# Patient Record
Sex: Female | Born: 1970 | Race: Black or African American | Hispanic: No | Marital: Single | Smoking: Never smoker
Health system: Southern US, Community
[De-identification: ages and names within clinical notes are randomized; demographics above are authoritative.]

## PROBLEM LIST (undated history)

## (undated) DIAGNOSIS — H409 Unspecified glaucoma: Secondary | ICD-10-CM

## (undated) DIAGNOSIS — J45909 Unspecified asthma, uncomplicated: Secondary | ICD-10-CM

## (undated) DIAGNOSIS — M25473 Effusion, unspecified ankle: Secondary | ICD-10-CM

## (undated) DIAGNOSIS — I10 Essential (primary) hypertension: Secondary | ICD-10-CM

## (undated) DIAGNOSIS — D259 Leiomyoma of uterus, unspecified: Secondary | ICD-10-CM

## (undated) HISTORY — DX: Leiomyoma of uterus, unspecified: D25.9

## (undated) HISTORY — DX: Unspecified asthma, uncomplicated: J45.909

## (undated) HISTORY — PX: WISDOM TOOTH EXTRACTION: SHX21

## (undated) HISTORY — DX: Unspecified glaucoma: H40.9

## (undated) HISTORY — DX: Effusion, unspecified ankle: M25.473

## (undated) HISTORY — DX: Essential (primary) hypertension: I10

---

## 1998-11-05 ENCOUNTER — Other Ambulatory Visit: Admission: RE | Admit: 1998-11-05 | Discharge: 1998-11-05 | Payer: Self-pay | Admitting: Obstetrics and Gynecology

## 2000-01-21 ENCOUNTER — Other Ambulatory Visit: Admission: RE | Admit: 2000-01-21 | Discharge: 2000-01-21 | Payer: Self-pay | Admitting: Obstetrics and Gynecology

## 2000-10-26 ENCOUNTER — Other Ambulatory Visit: Admission: RE | Admit: 2000-10-26 | Discharge: 2000-10-26 | Payer: Self-pay | Admitting: Obstetrics and Gynecology

## 2001-05-13 ENCOUNTER — Inpatient Hospital Stay (HOSPITAL_COMMUNITY): Admission: AD | Admit: 2001-05-13 | Discharge: 2001-05-13 | Payer: Self-pay | Admitting: Obstetrics and Gynecology

## 2001-05-15 ENCOUNTER — Inpatient Hospital Stay (HOSPITAL_COMMUNITY): Admission: AD | Admit: 2001-05-15 | Discharge: 2001-05-17 | Payer: Self-pay | Admitting: Obstetrics and Gynecology

## 2001-05-19 ENCOUNTER — Encounter: Admission: RE | Admit: 2001-05-19 | Discharge: 2001-06-18 | Payer: Self-pay | Admitting: Obstetrics and Gynecology

## 2001-10-25 ENCOUNTER — Other Ambulatory Visit: Admission: RE | Admit: 2001-10-25 | Discharge: 2001-10-25 | Payer: Self-pay | Admitting: Obstetrics and Gynecology

## 2002-11-02 ENCOUNTER — Other Ambulatory Visit: Admission: RE | Admit: 2002-11-02 | Discharge: 2002-11-02 | Payer: Self-pay | Admitting: Obstetrics and Gynecology

## 2003-10-31 ENCOUNTER — Other Ambulatory Visit: Admission: RE | Admit: 2003-10-31 | Discharge: 2003-10-31 | Payer: Self-pay | Admitting: Obstetrics and Gynecology

## 2004-11-07 ENCOUNTER — Other Ambulatory Visit: Admission: RE | Admit: 2004-11-07 | Discharge: 2004-11-07 | Payer: Self-pay | Admitting: Obstetrics and Gynecology

## 2005-12-27 ENCOUNTER — Encounter: Payer: Self-pay | Admitting: Emergency Medicine

## 2005-12-27 ENCOUNTER — Ambulatory Visit (HOSPITAL_COMMUNITY): Admission: AD | Admit: 2005-12-27 | Discharge: 2005-12-28 | Payer: Self-pay | Admitting: Obstetrics and Gynecology

## 2006-04-02 ENCOUNTER — Other Ambulatory Visit: Admission: RE | Admit: 2006-04-02 | Discharge: 2006-04-02 | Payer: Self-pay | Admitting: Obstetrics and Gynecology

## 2006-04-08 ENCOUNTER — Ambulatory Visit (HOSPITAL_COMMUNITY): Admission: RE | Admit: 2006-04-08 | Discharge: 2006-04-08 | Payer: Self-pay | Admitting: Obstetrics and Gynecology

## 2010-12-15 ENCOUNTER — Encounter: Payer: Self-pay | Admitting: Obstetrics and Gynecology

## 2012-01-22 ENCOUNTER — Ambulatory Visit (INDEPENDENT_AMBULATORY_CARE_PROVIDER_SITE_OTHER): Payer: Commercial Indemnity | Admitting: Obstetrics and Gynecology

## 2012-01-22 DIAGNOSIS — Z01419 Encounter for gynecological examination (general) (routine) without abnormal findings: Secondary | ICD-10-CM

## 2013-01-20 ENCOUNTER — Encounter: Payer: Self-pay | Admitting: Obstetrics and Gynecology

## 2013-01-20 ENCOUNTER — Ambulatory Visit: Payer: BC Managed Care – PPO | Admitting: Obstetrics and Gynecology

## 2013-01-20 VITALS — BP 108/70 | Ht 70.0 in | Wt 222.0 lb

## 2013-01-20 DIAGNOSIS — Z124 Encounter for screening for malignant neoplasm of cervix: Secondary | ICD-10-CM

## 2013-01-20 NOTE — Progress Notes (Signed)
ANNUAL GYNECOLOGIC EXAMINATION   Sarah Warren is a 42 y.o. female, G3P2001, who presents for an annual exam. The patient is doing well on Junel fe 1/20 OCP's.    History   Social History  . Marital Status: Married    Spouse Name: N/A    Number of Children: N/A  . Years of Education: N/A   Social History Main Topics  . Smoking status: Never Smoker   . Smokeless tobacco: Never Used  . Alcohol Use: Yes     Comment: occasional wine  . Drug Use: No  . Sexually Active: Yes    Birth Control/ Protection: Pill   Other Topics Concern  . None   Social History Narrative  . None    Menstrual cycle:   LMP: Patient's last menstrual period was 01/04/2013.             The following portions of the patient's history were reviewed and updated as appropriate: allergies, current medications, past family history, past medical history, past social history, past surgical history and problem list.  Review of Systems Pertinent items are noted in HPI. Breast:Negative for breast lump,nipple discharge or nipple retraction Gastrointestinal: Negative for abdominal pain, change in bowel habits or rectal bleeding Urinary:negative   Objective:    BP 108/70  Ht 5\' 10"  (1.778 m)  Wt 222 lb (100.699 kg)  BMI 31.85 kg/m2  LMP 01/04/2013    Weight:  Wt Readings from Last 1 Encounters:  01/20/13 222 lb (100.699 kg)          BMI: Body mass index is 31.85 kg/(m^2).  General Appearance: Alert, appropriate appearance for age. No acute distress HEENT: Grossly normal Neck / Thyroid: Supple, no masses, nodes or enlargement Lungs: clear to auscultation bilaterally Back: No CVA tenderness Breast Exam: No masses or nodes.No dimpling, nipple retraction or discharge. Cardiovascular: Regular rate and rhythm. S1, S2, no murmur Gastrointestinal: Soft, non-tender, no masses or organomegaly  ++++++++++++++++++++++++++++++++++++++++++++++++++++++++  Pelvic Exam: External genitalia: normal general  appearance Vaginal: normal without tenderness, induration or masses. Relaxation: Yes Cervix: normal appearance Adnexa: normal bimanual exam Uterus: normal size, shape, and consistency Rectovaginal: normal rectal, no masses  ++++++++++++++++++++++++++++++++++++++++++++++++++++++++  Lymphatic Exam: Non-palpable nodes in neck, clavicular, axillary, or inguinal regions Neurologic: Normal speech, no tremor  Psychiatric: Alert and oriented, appropriate affect.  Assessment:    Normal gyn exam   Overweight or obese: Yes   Pelvic relaxation: Yes  Contraceptive management   Plan:    mammogram pap smear return annually or prn Contraception:Junel Fe 1/20    Medications prescribed: OCP's  STD screen request: No   The updated Pap smear screening guidelines were discussed with the patient. The patient requested that I obtain a Pap smear: Yes.  Kegel exercises discussed: Yes.  Proper diet and regular exercise were reviewed.  Annual mammograms recommended starting at age 28. Proper breast care was discussed.  Regular health maintenance was reviewed.  Sleep hygiene was discussed.  Leonard Schwartz M.D.    Regular Periods: yes every 28-30 days Mammogram: yes  Monthly Breast Ex.: yes Exercise: yes  Tetanus < 10 years: no Seatbelts: yes  NI. Bladder Functn.: yes Abuse at home: no  Daily BM's: yes Stressful Work: yes  Healthy Diet: yes Sigmoid-Colonoscopy: n/a  Calcium: no Medical problems this year: none   LAST PAP:12/2011 Normal  Contraception: Junel  Mammogram:  Per pt 2012  PCP: none  PMH: none  FMH: none   Last Bone Scan: none

## 2014-04-25 ENCOUNTER — Encounter: Payer: Self-pay | Admitting: Emergency Medicine

## 2014-04-25 ENCOUNTER — Other Ambulatory Visit: Payer: Self-pay | Admitting: Emergency Medicine

## 2014-04-25 ENCOUNTER — Ambulatory Visit (INDEPENDENT_AMBULATORY_CARE_PROVIDER_SITE_OTHER): Payer: 59 | Admitting: Emergency Medicine

## 2014-04-25 VITALS — BP 118/78 | HR 54 | Temp 98.2°F | Resp 16 | Ht 69.0 in | Wt 223.0 lb

## 2014-04-25 DIAGNOSIS — Z1212 Encounter for screening for malignant neoplasm of rectum: Secondary | ICD-10-CM

## 2014-04-25 DIAGNOSIS — Z23 Encounter for immunization: Secondary | ICD-10-CM

## 2014-04-25 DIAGNOSIS — Z0000 Encounter for general adult medical examination without abnormal findings: Secondary | ICD-10-CM

## 2014-04-25 DIAGNOSIS — Z111 Encounter for screening for respiratory tuberculosis: Secondary | ICD-10-CM

## 2014-04-25 LAB — CBC WITH DIFFERENTIAL/PLATELET
BASOS PCT: 0 % (ref 0–1)
Basophils Absolute: 0 10*3/uL (ref 0.0–0.1)
EOS ABS: 0.1 10*3/uL (ref 0.0–0.7)
EOS PCT: 1 % (ref 0–5)
HCT: 45.8 % (ref 36.0–46.0)
Hemoglobin: 15.3 g/dL — ABNORMAL HIGH (ref 12.0–15.0)
LYMPHS ABS: 3 10*3/uL (ref 0.7–4.0)
Lymphocytes Relative: 41 % (ref 12–46)
MCH: 27.6 pg (ref 26.0–34.0)
MCHC: 33.4 g/dL (ref 30.0–36.0)
MCV: 82.5 fL (ref 78.0–100.0)
Monocytes Absolute: 0.6 10*3/uL (ref 0.1–1.0)
Monocytes Relative: 8 % (ref 3–12)
Neutro Abs: 3.7 10*3/uL (ref 1.7–7.7)
Neutrophils Relative %: 50 % (ref 43–77)
PLATELETS: 321 10*3/uL (ref 150–400)
RBC: 5.55 MIL/uL — ABNORMAL HIGH (ref 3.87–5.11)
RDW: 14.1 % (ref 11.5–15.5)
WBC: 7.4 10*3/uL (ref 4.0–10.5)

## 2014-04-25 NOTE — Patient Instructions (Signed)
Tuberculin Skin Test The PPD skin test is a method used to help with the diagnosis of a disease called tuberculosis (TB). HOW THE TEST IS DONE  The test site (usually the forearm) is cleansed. The PPD extract is then injected under the top layer of skin, causing a blister to form on the skin. The reaction will take 48 - 72 hours to develop. You must return to your health care provider within that time to have the area checked. This will determine whether you have had a significant reaction to the PPD test. A reaction is measured in millimeters of hard swelling (induration) at the site. PREPARATION FOR TEST  There is no special preparation for this test. People with a skin rash or other skin irritations on their arms may need to have the test performed at a different spot on the body. Tell your health care provider if you have ever had a positive PPD skin test. If so, you should not have a repeat PPD test. Tell your doctor if you have a medical condition or if you take certain drugs, such as steroids, that can affect your immune system. These situations may lead to inaccurate test results. NORMAL FINDINGS A negative reaction (no induration) or a level of hard swelling that falls below a certain cutoff may mean that a person has not been infected with the bacteria that cause TB. There are different cutoffs for children, people with HIV, and other risk groups. Unfortunately, this is not a perfect test, and up to 20% of people infected with tuberculosis may not have a reaction on the PPD skin test. In addition, certain conditions that affect the immune system (cancer, recent chemotherapy, late-stage AIDS) may cause a false-negative test result.  The reaction will take 48 - 72 hours to develop. You must return to your health care provider within that time to have the area checked. Follow your caregiver's instructions as to where and when to report for this to be done. Ranges for normal findings may vary  among different laboratories and hospitals. You should always check with your doctor after having lab work or other tests done to discuss the meaning of your test results and whether your values are considered within normal limits. WHAT ABNORMAL RESULTS MEAN  The results of the test depend on the size of the skin reaction and on the person being tested.  A small reaction (5 mm of hard swelling at the site) is considered to be positive in people who have HIV, who are taking steroid therapy, or who have been in close contact with a person who has active tuberculosis. Larger reactions (greater than or equal to 10 mm) are considered positive in people with diabetes or kidney failure, and in health care workers, among others. In people with no known risks for tuberculosis, a positive reaction requires 15 mm or more of hard swelling at the site. RISKS AND COMPLICATIONS There is a very small risk of severe redness and swelling of the arm in people who have had a previous positive PPD test and who have the test again. There also have been a few rare cases of this reaction in people who have not been tested before. CONSIDERATIONS  A positive skin test does not necessarily mean that a person has active tuberculosis. More tests will be done to check whether active disease is present. Many people who were born outside the [place] may have had a vaccine called "BCG," which can lead to a false-positive test   result. MEANING OF TEST  Your caregiver will go over the test results with you and discuss the importance and meaning of your results, as well as treatment options and the need for additional tests if necessary. OBTAINING THE TEST RESULTS It is your responsibility to obtain your test results. Ask the lab or department performing the test when and how you will get your results. Document Released: 08/20/2005 Document Revised: 02/02/2012 Document Reviewed: 10/22/2008 Exit[employer] Patient Information 2014  [employer]. Tetanus, Diphtheria, Pertussis (Tdap) Vaccine What You Need to Know WHY GET VACCINATED? Tetanus, diphtheria and pertussis can be very serious diseases, even for adolescents and adults. Tdap vaccine can protect us from these diseases. TETANUS (Lockjaw) causes painful muscle tightening and stiffness, usually all over the body.  It can lead to tightening of muscles in the head and neck so you can't open your mouth, swallow, or sometimes even breathe. Tetanus kills about 1 out of 5 people who are infected. DIPHTHERIA can cause a thick coating to form in the back of the throat.  It can lead to breathing problems, paralysis, heart failure, and death. PERTUSSIS (Whooping Cough) causes severe coughing spells, which can cause difficulty breathing, vomiting and disturbed sleep.  It can also lead to weight loss, incontinence, and rib fractures. Up to 2 in 100 adolescents and 5 in 100 adults with pertussis are hospitalized or have complications, which could include pneumonia and death. These diseases are caused by bacteria. Diphtheria and pertussis are spread from person to person through coughing or sneezing. Tetanus enters the body through cuts, scratches, or wounds. Before vaccines, the [place] saw as many as 200,000 cases a year of diphtheria and pertussis, and hundreds of cases of tetanus. Since vaccination began, tetanus and diphtheria have dropped by about 99% and pertussis by about 80%. TDAP VACCINE Tdap vaccine can protect adolescents and adults from tetanus, diphtheria, and pertussis. One dose of Tdap is routinely given at age 11 or 12. People who did not get Tdap at that age should get it as soon as possible. Tdap is especially important for health care professionals and anyone having close contact with a baby younger than 12 months. Pregnant women should get a dose of Tdap during every pregnancy, to protect the newborn from pertussis. Infants are most at risk for severe,  life-threatening complications from pertussis. A similar vaccine, called Td, protects from tetanus and diphtheria, but not pertussis. A Td booster should be given every 10 years. Tdap may be given as one of these boosters if you have not already gotten a dose. Tdap may also be given after a severe cut or burn to prevent tetanus infection. Your doctor can give you more information. Tdap may safely be given at the same time as other vaccines. SOME PEOPLE SHOULD NOT GET THIS VACCINE  If you ever had a life-threatening allergic reaction after a dose of any tetanus, diphtheria, or pertussis containing vaccine, OR if you have a severe allergy to any part of this vaccine, you should not get Tdap. Tell your doctor if you have any severe allergies.  If you had a coma, or long or multiple seizures within 7 days after a childhood dose of DTP or DTaP, you should not get Tdap, unless a cause other than the vaccine was found. You can still get Td.  Talk to your doctor if you:  have epilepsy or another nervous system problem,  had severe pain or swelling after any vaccine containing diphtheria, tetanus or pertussis,  ever   had Guillain-Barr Syndrome (GBS),  aren't feeling well on the day the shot is scheduled. RISKS OF A VACCINE REACTION With any medicine, including vaccines, there is a chance of side effects. These are usually mild and go away on their own, but serious reactions are also possible. Brief fainting spells can follow a vaccination, leading to injuries from falling. Sitting or lying down for about 15 minutes can help prevent these. Tell your doctor if you feel dizzy or light-headed, or have vision changes or ringing in the ears. Mild problems following Tdap (Did not interfere with activities)  Pain where the shot was given (about 3 in 4 adolescents or 2 in 3 adults)  Redness or swelling where the shot was given (about 1 person in 5)  Mild fever of at least 100.4F (up to about 1 in 25  adolescents or 1 in 100 adults)  Headache (about 3 or 4 people in 10)  Tiredness (about 1 person in 3 or 4)  Nausea, vomiting, diarrhea, stomach ache (up to 1 in 4 adolescents or 1 in 10 adults)  Chills, body aches, sore joints, rash, swollen glands (uncommon) Moderate problems following Tdap (Interfered with activities, but did not require medical attention)  Pain where the shot was given (about 1 in 5 adolescents or 1 in 100 adults)  Redness or swelling where the shot was given (up to about 1 in 16 adolescents or 1 in 25 adults)  Fever over 102F (about 1 in 100 adolescents or 1 in 250 adults)  Headache (about 3 in 20 adolescents or 1 in 10 adults)  Nausea, vomiting, diarrhea, stomach ache (up to 1 or 3 people in 100)  Swelling of the entire arm where the shot was given (up to about 3 in 100). Severe problems following Tdap (Unable to perform usual activities, required medical attention)  Swelling, severe pain, bleeding and redness in the arm where the shot was given (rare). A severe allergic reaction could occur after any vaccine (estimated less than 1 in a million doses). WHAT IF THERE IS A SERIOUS REACTION? What should I look for?  Look for anything that concerns you, such as signs of a severe allergic reaction, very high fever, or behavior changes. Signs of a severe allergic reaction can include hives, swelling of the face and throat, difficulty breathing, a fast heartbeat, dizziness, and weakness. These would start a few minutes to a few hours after the vaccination. What should I do?  If you think it is a severe allergic reaction or other emergency that can't wait, call 9-1-1 or get the person to the nearest hospital. Otherwise, call your doctor.  Afterward, the reaction should be reported to the "Vaccine Adverse Event Reporting System" (VAERS). Your doctor might file this report, or you can do it yourself through the VAERS web site at www.vaers.hhs.gov, or by calling  1-800-822-7967. VAERS is only for reporting reactions. They do not give medical advice.  THE NATIONAL VACCINE INJURY COMPENSATION PROGRAM The National Vaccine Injury Compensation Program (VICP) is a federal program that was created to compensate people who may have been injured by certain vaccines. Persons who believe they may have been injured by a vaccine can learn about the program and about filing a claim by calling 1-800-338-2382 or visiting the VICP website at www.hrsa.gov/vaccinecompensation. HOW CAN I LEARN MORE?  Ask your doctor.  Call your local or state health department.  Contact the Centers for Disease Control and Prevention (CDC):  Call 1-800-232-4636 or visit CDC's website at   www.cdc.gov/vaccines. CDC Tdap Vaccine VIS (04/01/12) Document Released: 05/11/2012 Document Revised: 03/07/2013 Document Reviewed: 03/02/2013 [employer] Patient Information 2014 [employer].  

## 2014-04-25 NOTE — Progress Notes (Signed)
Subjective:    Patient ID: Sarah Warren, female    DOB: 13-Mar-1971, 43 y.o.   MRN: [id number]  HPI Comments: 43 yo pleasant AAF new patient here to establish care and get CPE. She is overall healthy. She had childhood asthma but denies any adult flares. She has not been on medication in over 10 years for asthma. She exercises 3-4 x week with cardio. She eats healthy for the most part. She has healthy parents except for HTN hx but she notes both are controlled. She has a 35 year old healthy son.     Medication List       This list is accurate as of: 04/25/14  9:47 PM.  Always use your most recent med list.               multivitamin tablet  Take 1 tablet by mouth daily.     norethindrone-ethinyl estradiol 1-20 MG-MCG tablet  Commonly known as:  JUNEL FE,GILDESS FE,LOESTRIN FE  Take 1 tablet by mouth daily.       No Known Allergies  Past Medical History  Diagnosis Date  . Asthma     childhood   Past Surgical History  Procedure Laterality Date  . Wisdom tooth extraction     History  Substance Use Topics  . Smoking status: Never Smoker   . Smokeless tobacco: Never Used  . Alcohol Use: Yes     Comment: occasional wine   Family History  Problem Relation Age of Onset  . Hypertension Mother   . Hypertension Father   . Diabetes Maternal Grandmother    MAINTENANCE: Mammo:2015 per pt at GYN WNL Pap/ Pelvic:3/ 2015 wnl EYE:2015, glasses Dentist:q 6 month  IMMUNIZATIONS: Tdap:? Influenza:?  Patient Care Team: Unk Pinto, MD as PCP - General (Internal Medicine) Ena Dawley, MD as Consulting Physician (Obstetrics and Gynecology) Iona Beard, MD as Referring Physician (Optometry) Dental works     Review of Systems  All other systems reviewed and are negative.  BP 118/78  Pulse 54  Temp(Src) 98.2 F (36.8 C) (Temporal)  Resp 16  Ht 5\' 9"  (1.753 m)  Wt 223 lb (101.152 kg)  BMI 32.92 kg/m2  LMP 04/20/2014     Objective:   Physical Exam   Nursing note and vitals reviewed. Constitutional: She is oriented to person, place, and time. She appears well-developed and well-nourished. No distress.  overweight  HENT:  Head: Normocephalic and atraumatic.  Right Ear: External ear normal.  Left Ear: External ear normal.  Nose: Nose normal.  Mouth/Throat: Oropharynx is clear and moist.  Eyes: Conjunctivae and EOM are normal. Pupils are equal, round, and reactive to light. Right eye exhibits no discharge. Left eye exhibits no discharge. No scleral icterus.  Neck: Normal range of motion. Neck supple. No JVD present. No tracheal deviation present. No thyromegaly present.  Cardiovascular: Normal rate, regular rhythm, normal heart sounds and intact distal pulses.   Pulmonary/Chest: Effort normal and breath sounds normal.  Abdominal: Soft. Bowel sounds are normal. She exhibits no distension and no mass. There is no tenderness. There is no rebound and no guarding.  Genitourinary:  Def gyn  Musculoskeletal: Normal range of motion. She exhibits no edema and no tenderness.  Lymphadenopathy:    She has no cervical adenopathy.  Neurological: She is alert and oriented to person, place, and time. She has normal reflexes. No cranial nerve deficit. She exhibits normal muscle tone. Coordination normal.  Skin: Skin is warm and dry. No rash noted. No  erythema. No pallor.  Psychiatric: She has a normal mood and affect. Her behavior is normal. Judgment and thought content normal.     EKG NSCSPT WNL      Assessment & Plan:  1. CPE/ new patient to establish- Update screening labs/ History/ Immunizations/ Testing as needed. Advised healthy diet, QD exercise, increase H20 and continue RX/ Vitamins AD.  2. + FHX HTN/ DM- check labs

## 2014-04-26 LAB — URINALYSIS, ROUTINE W REFLEX MICROSCOPIC
Bilirubin Urine: NEGATIVE
Glucose, UA: NEGATIVE mg/dL
KETONES UR: NEGATIVE mg/dL
Leukocytes, UA: NEGATIVE
Nitrite: NEGATIVE
PH: 5.5 (ref 5.0–8.0)
Protein, ur: NEGATIVE mg/dL
SPECIFIC GRAVITY, URINE: 1.023 (ref 1.005–1.030)
UROBILINOGEN UA: 0.2 mg/dL (ref 0.0–1.0)

## 2014-04-26 LAB — URINALYSIS, MICROSCOPIC ONLY
BACTERIA UA: NONE SEEN
CRYSTALS: NONE SEEN
Casts: NONE SEEN
Squamous Epithelial / LPF: NONE SEEN

## 2014-04-26 LAB — BASIC METABOLIC PANEL WITH GFR
BUN: 14 mg/dL (ref 6–23)
CALCIUM: 9.7 mg/dL (ref 8.4–10.5)
CO2: 24 meq/L (ref 19–32)
Chloride: 103 mEq/L (ref 96–112)
Creat: 0.82 mg/dL (ref 0.50–1.10)
GFR, Est African American: >89 mL/min
GFR, Est Non African American: 87 mL/min
Glucose, Bld: 80 mg/dL (ref 70–99)
Potassium: 3.9 mEq/L (ref 3.5–5.3)
SODIUM: 138 meq/L (ref 135–145)

## 2014-04-26 LAB — MICROALBUMIN / CREATININE URINE RATIO
Creatinine, Urine: 204.4 mg/dL
MICROALB UR: 0.56 mg/dL (ref 0.00–1.89)
Microalb Creat Ratio: 2.7 mg/g (ref 0.0–30.0)

## 2014-04-26 LAB — LIPID PANEL
CHOL/HDL RATIO: 3.5 ratio
Cholesterol: 204 mg/dL — ABNORMAL HIGH (ref 0–200)
HDL: 58 mg/dL (ref >39)
LDL Cholesterol: 133 mg/dL — ABNORMAL HIGH (ref 0–99)
TRIGLYCERIDES: 66 mg/dL (ref <150)
VLDL: 13 mg/dL (ref 0–40)

## 2014-04-26 LAB — HEPATIC FUNCTION PANEL
ALT: 20 U/L (ref 0–35)
AST: 18 U/L (ref 0–37)
Albumin: 3.9 g/dL (ref 3.5–5.2)
Alkaline Phosphatase: 51 U/L (ref 39–117)
BILIRUBIN DIRECT: 0.1 mg/dL (ref 0.0–0.3)
BILIRUBIN INDIRECT: 0.4 mg/dL (ref 0.2–1.2)
Total Bilirubin: 0.5 mg/dL (ref 0.2–1.2)
Total Protein: 7.8 g/dL (ref 6.0–8.3)

## 2014-04-26 LAB — HEMOGLOBIN A1C
Hgb A1c MFr Bld: 5.9 % — ABNORMAL HIGH (ref <5.7)
Mean Plasma Glucose: 123 mg/dL — ABNORMAL HIGH (ref <117)

## 2014-04-26 LAB — MAGNESIUM: Magnesium: 1.9 mg/dL (ref 1.5–2.5)

## 2014-04-26 LAB — INSULIN, FASTING: INSULIN FASTING, SERUM: 12 u[IU]/mL (ref 3–28)

## 2014-04-26 LAB — VITAMIN D 25 HYDROXY (VIT D DEFICIENCY, FRACTURES): Vit D, 25-Hydroxy: 29 ng/mL — ABNORMAL LOW (ref 30–89)

## 2014-04-26 LAB — TSH: TSH: 1.131 u[IU]/mL (ref 0.350–4.500)

## 2014-04-27 LAB — URINE CULTURE

## 2014-04-28 ENCOUNTER — Encounter: Payer: Self-pay | Admitting: *Deleted

## 2014-04-28 ENCOUNTER — Other Ambulatory Visit: Payer: Self-pay | Admitting: Emergency Medicine

## 2014-04-28 LAB — TB SKIN TEST
Induration: 0 mm
TB SKIN TEST: NEGATIVE

## 2014-04-28 MED ORDER — CIPROFLOXACIN HCL 250 MG PO TABS: 250.0000 mg | ORAL_TABLET | Freq: Two times a day (BID) | ORAL | Status: AC

## 2014-07-23 DIAGNOSIS — J45909 Unspecified asthma, uncomplicated: Secondary | ICD-10-CM | POA: Insufficient documentation

## 2014-07-27 ENCOUNTER — Ambulatory Visit: Payer: Self-pay | Admitting: Physician Assistant

## 2014-07-27 ENCOUNTER — Encounter: Payer: Self-pay | Admitting: Internal Medicine

## 2014-08-01 ENCOUNTER — Ambulatory Visit: Payer: Self-pay | Admitting: Emergency Medicine

## 2014-09-20 ENCOUNTER — Ambulatory Visit (INDEPENDENT_AMBULATORY_CARE_PROVIDER_SITE_OTHER): Payer: 59 | Admitting: Physician Assistant

## 2014-09-20 ENCOUNTER — Encounter: Payer: Self-pay | Admitting: Physician Assistant

## 2014-09-20 VITALS — BP 120/80 | HR 60 | Temp 98.6°F | Resp 16 | Ht 69.0 in | Wt 222.0 lb

## 2014-09-20 DIAGNOSIS — R7303 Prediabetes: Secondary | ICD-10-CM

## 2014-09-20 DIAGNOSIS — Z79899 Other long term (current) drug therapy: Secondary | ICD-10-CM

## 2014-09-20 DIAGNOSIS — E785 Hyperlipidemia, unspecified: Secondary | ICD-10-CM

## 2014-09-20 DIAGNOSIS — R7309 Other abnormal glucose: Secondary | ICD-10-CM

## 2014-09-20 LAB — CBC WITH DIFFERENTIAL/PLATELET
BASOS PCT: 0 % (ref 0–1)
Basophils Absolute: 0 10*3/uL (ref 0.0–0.1)
Eosinophils Absolute: 0.2 10*3/uL (ref 0.0–0.7)
Eosinophils Relative: 2 % (ref 0–5)
HCT: 43.9 % (ref 36.0–46.0)
Hemoglobin: 15.3 g/dL — ABNORMAL HIGH (ref 12.0–15.0)
LYMPHS PCT: 40 % (ref 12–46)
Lymphs Abs: 3.6 10*3/uL (ref 0.7–4.0)
MCH: 27.9 pg (ref 26.0–34.0)
MCHC: 34.9 g/dL (ref 30.0–36.0)
MCV: 80 fL (ref 78.0–100.0)
Monocytes Absolute: 0.6 10*3/uL (ref 0.1–1.0)
Monocytes Relative: 7 % (ref 3–12)
NEUTROS ABS: 4.5 10*3/uL (ref 1.7–7.7)
NEUTROS PCT: 51 % (ref 43–77)
Platelets: 309 10*3/uL (ref 150–400)
RBC: 5.49 MIL/uL — ABNORMAL HIGH (ref 3.87–5.11)
RDW: 14.4 % (ref 11.5–15.5)
WBC: 8.9 10*3/uL (ref 4.0–10.5)

## 2014-09-20 NOTE — Patient Instructions (Addendum)
Dr. Baker Janus, Eat to live,, end of dieting, the end of diabetes Please get on 5000 IU vitamin D    Bad carbs also include fruit juice, alcohol, and sweet tea. These are empty calories that do not signal to your brain that you are full.   Please remember the good carbs are still carbs which convert into sugar. So please measure them out no more than 1/2-1 cup of rice, oatmeal, pasta, and beans.  Veggies are however free foods! Pile them on.   I like lean protein at night such as chicken, [place], pork chops, cottage cheese, etc. Just do not fry these meats and please center your meal around vegetable, the meats should be a side dish.   No all fruit is created equal. Please see the list below, the fruit at the bottom is higher in sugars than the fruit at the top

## 2014-09-20 NOTE — Progress Notes (Signed)
Assessment and Plan:  Obesity with co morbidities- long discussion about weight loss, diet, and exercise Cholesterol: Continue diet and exercise. Check cholesterol.  Pre-diabetes-Continue diet and exercise. Check A1C Vitamin D Def- start on medications.   Continue diet and meds as discussed. Further disposition pending results of labs.  HPI 43 y.o. female  presents for 3 month follow up with hypertension, hyperlipidemia, prediabetes and vitamin D. Her blood pressure has been controlled at home, today their BP is BP: 120/80 mmHg She does workout. She denies chest pain, shortness of breath, dizziness.  She is not on cholesterol medication and denies myalgias. Her cholesterol is not at goal. The cholesterol last visit was:   Lab Results  Component Value Date   CHOL 204* 04/25/2014   HDL 58 04/25/2014   LDLCALC 133* 04/25/2014   TRIG 66 04/25/2014   CHOLHDL 3.5 04/25/2014   She has been working on diet and exercise for prediabetes, and denies paresthesia of the feet, polydipsia, polyuria and visual disturbances. Last A1C in the office was:  Lab Results  Component Value Date   HGBA1C 5.9* 04/25/2014   Patient is NOT on Vitamin D supplement.   Lab Results  Component Value Date   VD25OH 29* 04/25/2014       Current Medications:  Current Outpatient Prescriptions on File Prior to Visit  Medication Sig Dispense Refill  . Multiple Vitamin (MULTIVITAMIN) tablet Take 1 tablet by mouth daily.      . norethindrone-ethinyl estradiol (JUNEL FE,GILDESS FE,LOESTRIN FE) 1-20 MG-MCG tablet Take 1 tablet by mouth daily.       No current facility-administered medications on file prior to visit.   Medical History:  Past Medical History  Diagnosis Date  . Asthma     childhood   Allergies: No Known Allergies   Review of Systems: [X]  = complains of  [ ]  = denies  General: Fatigue [ ]  Fever [ ]  Chills [ ]  Weakness [ ]   Insomnia [ ]  Eyes: Redness [ ]  Blurred vision [ ]  Diplopia [ ]   ENT: Congestion [ ]   Sinus Pain [ ]  Post Nasal Drip [ ]  Sore Throat [ ]  Earache [ ]   Cardiac: Chest pain/pressure [ ]  SOB [ ]  Orthopnea [ ]   Palpitations [ ]   Paroxysmal nocturnal dyspnea[ ]  Claudication [ ]  Edema [ ]   Pulmonary: Cough [ ]  Wheezing[ ]   SOB [ ]   Snoring [ ]   GI: Nausea [ ]  Vomiting[ ]  Dysphagia[ ]  Heartburn[ ]  Abdominal pain [ ]  Constipation [ ] ; Diarrhea [ ] ; BRBPR [ ]  Melena[ ]  GU: Hematuria[ ]  Dysuria [ ]  Nocturia[ ]  Urgency [ ]   Hesitancy [ ]  Discharge [ ]  Neuro: Headaches[ ]  Vertigo[ ]  Paresthesias[ ]  Spasm [ ]  Speech changes [ ]  Incoordination [ ]   Ortho: Arthritis [ ]  Joint pain [ ]  Muscle pain [ ]  Joint swelling [ ]  Back Pain [ ]  Skin:  Rash [ ]   Pruritis [ ]  Change in skin lesion [ ]   Psych: Depression[ ]  Anxiety[ ]  Confusion [ ]  Memory loss [ ]   Heme/Lypmh: Bleeding [ ]  Bruising [ ]  Enlarged lymph nodes [ ]   Endocrine: Visual blurring [ ]  Paresthesia [ ]  Polyuria [ ]  Polydypsea [ ]    Heat/cold intolerance [ ]  Hypoglycemia [ ]   Family history- Review and unchanged Social history- Review and unchanged Physical Exam: BP 120/80  Pulse 60  Temp(Src) 98.6 F (37 C)  Resp 16  Ht 5\' 9"  (1.753 m)  Wt 222 lb (100.699 kg)  BMI 32.77 kg/m2 Wt Readings from Last 3 Encounters:  09/20/14 222 lb (100.699 kg)  04/25/14 223 lb (101.152 kg)  01/20/13 222 lb (100.699 kg)   General Appearance: Well nourished, in no apparent distress. Eyes: PERRLA, EOMs, conjunctiva no swelling or erythema Sinuses: No Frontal/maxillary tenderness ENT/Mouth: Ext aud canals clear, TMs without erythema, bulging. No erythema, swelling, or exudate on post pharynx.  Tonsils not swollen or erythematous. Hearing normal.  Neck: Supple, thyroid normal.  Respiratory: Respiratory effort normal, BS equal bilaterally without rales, rhonchi, wheezing or stridor.  Cardio: RRR with no MRGs. Brisk peripheral pulses without edema.  Abdomen: Soft, + BS.  Non tender, no guarding, rebound, hernias, masses. Lymphatics: Non tender  without lymphadenopathy.  Musculoskeletal: Full ROM, 5/5 strength, normal gait.  Skin: Warm, dry without rashes, lesions, ecchymosis.  Neuro: Cranial nerves intact. Normal muscle tone, no cerebellar symptoms. Sensation intact.  Psych: Awake and oriented X 3, normal affect, Insight and Judgment appropriate.    Vicie Mutters, PA-C 4:42 PM [facility]

## 2014-09-21 LAB — BASIC METABOLIC PANEL WITH GFR
BUN: 15 mg/dL (ref 6–23)
CHLORIDE: 105 meq/L (ref 96–112)
CO2: 24 mEq/L (ref 19–32)
Calcium: 8.9 mg/dL (ref 8.4–10.5)
Creat: 0.76 mg/dL (ref 0.50–1.10)
Glucose, Bld: 82 mg/dL (ref 70–99)
Potassium: 4.5 mEq/L (ref 3.5–5.3)
Sodium: 139 mEq/L (ref 135–145)

## 2014-09-21 LAB — HEPATIC FUNCTION PANEL
ALT: 14 U/L (ref 0–35)
AST: 14 U/L (ref 0–37)
Albumin: 4 g/dL (ref 3.5–5.2)
Alkaline Phosphatase: 53 U/L (ref 39–117)
BILIRUBIN INDIRECT: 0.2 mg/dL (ref 0.2–1.2)
BILIRUBIN TOTAL: 0.3 mg/dL (ref 0.2–1.2)
Bilirubin, Direct: 0.1 mg/dL (ref 0.0–0.3)
TOTAL PROTEIN: 7.8 g/dL (ref 6.0–8.3)

## 2014-09-21 LAB — LIPID PANEL
CHOL/HDL RATIO: 3.8 ratio
Cholesterol: 201 mg/dL — ABNORMAL HIGH (ref 0–200)
HDL: 53 mg/dL (ref >39)
LDL CALC: 129 mg/dL — AB (ref 0–99)
Triglycerides: 93 mg/dL (ref <150)
VLDL: 19 mg/dL (ref 0–40)

## 2014-09-21 LAB — INSULIN, FASTING: Insulin fasting, serum: 9.1 u[IU]/mL (ref 2.0–19.6)

## 2014-09-21 LAB — HEMOGLOBIN A1C
Hgb A1c MFr Bld: 6 % — ABNORMAL HIGH (ref <5.7)
MEAN PLASMA GLUCOSE: 126 mg/dL — AB (ref <117)

## 2014-09-21 LAB — TSH: TSH: 1.193 u[IU]/mL (ref 0.350–4.500)

## 2014-09-21 LAB — MAGNESIUM: Magnesium: 2.1 mg/dL (ref 1.5–2.5)

## 2014-09-25 ENCOUNTER — Encounter: Payer: Self-pay | Admitting: Physician Assistant

## 2014-12-28 ENCOUNTER — Encounter: Payer: Self-pay | Admitting: Physician Assistant

## 2014-12-28 ENCOUNTER — Ambulatory Visit (INDEPENDENT_AMBULATORY_CARE_PROVIDER_SITE_OTHER): Payer: 59 | Admitting: Physician Assistant

## 2014-12-28 VITALS — BP 110/78 | HR 64 | Temp 98.1°F | Resp 16 | Ht 69.0 in | Wt 228.0 lb

## 2014-12-28 DIAGNOSIS — E785 Hyperlipidemia, unspecified: Secondary | ICD-10-CM

## 2014-12-28 DIAGNOSIS — E559 Vitamin D deficiency, unspecified: Secondary | ICD-10-CM | POA: Insufficient documentation

## 2014-12-28 DIAGNOSIS — E669 Obesity, unspecified: Secondary | ICD-10-CM

## 2014-12-28 DIAGNOSIS — Z79899 Other long term (current) drug therapy: Secondary | ICD-10-CM

## 2014-12-28 DIAGNOSIS — R7309 Other abnormal glucose: Secondary | ICD-10-CM

## 2014-12-28 DIAGNOSIS — R7303 Prediabetes: Secondary | ICD-10-CM

## 2014-12-28 NOTE — Progress Notes (Signed)
Assessment and Plan:  Hypertension: Continue medication, monitor blood pressure at home. Continue DASH diet.  Reminder to go to the ER if any CP, SOB, nausea, dizziness, severe HA, changes vision/speech, left arm numbness and tingling, and jaw pain. Cholesterol: Continue diet and exercise. Check cholesterol.  Pre-diabetes-Continue diet and exercise. Check A1C Vitamin D Def- check level and continue medications.   Continue diet and meds as discussed. Further disposition pending results of labs.  HPI 44 y.o. female  presents for 3 month follow up with hypertension, hyperlipidemia, prediabetes and vitamin D.  Her blood pressure has been controlled at home, today their BP is BP: 110/78 mmHg  She does workout, does zumba twice a week. She denies chest pain, shortness of breath, dizziness.  She is not on cholesterol medication and denies myalgias. Her cholesterol is at goal. The cholesterol last visit was:   Lab Results  Component Value Date   CHOL 201* 09/20/2014   HDL 53 09/20/2014   LDLCALC 129* 09/20/2014   TRIG 93 09/20/2014   CHOLHDL 3.8 09/20/2014   She has been working on diet and exercise for prediabetes, and denies paresthesia of the feet, polydipsia, polyuria and visual disturbances. Last A1C in the office was:  Lab Results  Component Value Date   HGBA1C 6.0* 09/20/2014  Patient is on Vitamin D supplement, 5000 IU QD.    Lab Results  Component Value Date   VD25OH 29* 04/25/2014   BMI is Body mass index is 33.65 kg/(m^2)., she is working on diet and exercise. Wt Readings from Last 3 Encounters:  12/28/14 228 lb (103.42 kg)  09/20/14 222 lb (100.699 kg)  04/25/14 223 lb (101.152 kg)       Current Medications:  Current Outpatient Prescriptions on File Prior to Visit  Medication Sig Dispense Refill  . Multiple Vitamin (MULTIVITAMIN) tablet Take 1 tablet by mouth daily.    . norethindrone-ethinyl estradiol (JUNEL FE,GILDESS FE,LOESTRIN FE) 1-20 MG-MCG tablet Take 1 tablet by  mouth daily.     No current facility-administered medications on file prior to visit.   Medical History:  Past Medical History  Diagnosis Date  . Asthma     childhood   Allergies: No Known Allergies   Review of Systems:  Review of Systems  Constitutional: Negative.   HENT: Negative.   Eyes: Negative.   Respiratory: Negative.   Cardiovascular: Negative.   Gastrointestinal: Negative.   Genitourinary: Negative.   Musculoskeletal: Negative.   Skin: Negative.   Neurological: Negative.   Endo/Heme/Allergies: Negative.   Psychiatric/Behavioral: Negative.     Family history- Review and unchanged Social history- Review and unchanged Physical Exam: BP 110/78 mmHg  Pulse 64  Temp(Src) 98.1 F (36.7 C)  Resp 16  Wt 228 lb (103.42 kg) Wt Readings from Last 3 Encounters:  12/28/14 228 lb (103.42 kg)  09/20/14 222 lb (100.699 kg)  04/25/14 223 lb (101.152 kg)   General Appearance: Well nourished, in no apparent distress. Eyes: PERRLA, EOMs, conjunctiva no swelling or erythema Sinuses: No Frontal/maxillary tenderness ENT/Mouth: Ext aud canals clear, TMs without erythema, bulging. No erythema, swelling, or exudate on post pharynx.  Tonsils not swollen or erythematous. Hearing normal.  Neck: Supple, thyroid normal.  Respiratory: Respiratory effort normal, BS equal bilaterally without rales, rhonchi, wheezing or stridor.  Cardio: RRR with no MRGs. Brisk peripheral pulses without edema.  Abdomen: Soft, + BS,  Non tender, no guarding, rebound, hernias, masses. Lymphatics: Non tender without lymphadenopathy.  Musculoskeletal: Full ROM, 5/5 strength, Normal Skin: Warm,  dry without rashes, lesions, ecchymosis.  Neuro: Cranial nerves intact. Normal muscle tone, no cerebellar symptoms. Psych: Awake and oriented X 3, normal affect, Insight and Judgment appropriate.    Vicie Mutters, PA-C 4:11 PM [facility]

## 2014-12-28 NOTE — Patient Instructions (Signed)
Before you even begin to attack a weight-loss plan, it pays to remember this: You are not fat. You have fat. Losing weight isn't about blame or shame; it's simply another achievement to accomplish. Dieting is like any other skill-you have to buckle down and work at it. As long as you act in a smart, reasonable way, you'll ultimately get where you want to be. Here are some weight loss pearls for you.  1. It's Not a Diet. It's a Lifestyle Thinking of a diet as something you're on and suffering through only for the short term doesn't work. To shed weight and keep it off, you need to make permanent changes to the way you eat. It's OK to indulge occasionally, of course, but if you cut calories temporarily and then revert to your old way of eating, you'll gain back the weight quicker than you can say yo-yo. Use it to lose it. Research shows that one of the best predictors of long-term weight loss is how many pounds you drop in the first month. For that reason, nutritionists often suggest being stricter for the first two weeks of your new eating strategy to build momentum. Cut out added sugar and alcohol and avoid unrefined carbs. After that, figure out how you can reincorporate them in a way that's healthy and maintainable.  2. There's a Right Way to Exercise Working out burns calories and fat and boosts your metabolism by building muscle. But those trying to lose weight are notorious for overestimating the number of calories they burn and underestimating the amount they take in. Unfortunately, your system is biologically programmed to hold on to extra pounds and that means when you start exercising, your body senses the deficit and ramps up its hunger signals. If you're not diligent, you'll eat everything you burn and then some. Use it to lose it. Cardio gets all the exercise glory, but strength and interval training are the real heroes. They help you build lean muscle, which in turn increases your metabolism and  calorie-burning ability 3. Don't Overreact to Mild Hunger Some people have a hard time losing weight because of hunger anxiety. To them, being hungry is bad-something to be avoided at all costs-so they carry snacks with them and eat when they don't need to. Others eat because they're stressed out or bored. While you never want to get to the point of being ravenous (that's when bingeing is likely to happen), a hunger pang, a craving, or the fact that it's 3:00 p.m. should not send you racing for the vending machine or obsessing about the energy bar in your purse. Ideally, you should put off eating until your stomach is growling and it's difficult to concentrate.  Use it to lose it. When you feel the urge to eat, use the HALT method. Ask yourself, Am I really hungry? Or am I angry or anxious, lonely or bored, or tired? If you're still not certain, try the apple test. If you're truly hungry, an apple should seem delicious; if it doesn't, something else is going on. Or you can try drinking water and making yourself busy, if you are still hungry try a healthy snack.  4. Not All Calories Are Created Equal The mechanics of weight loss are pretty simple: Take in fewer calories than you use for energy. But the kind of food you eat makes all the difference. Processed food that's high in saturated fat and refined starch or sugar can cause inflammation that disrupts the hormone signals that tell   your brain you're full. The result: You eat a lot more.  Use it to lose it. Clean up your diet. Swap in whole, unprocessed foods, including vegetables, lean protein, and healthy fats that will fill you up and give you the biggest nutritional bang for your calorie buck. In a few weeks, as your brain starts receiving regular hunger and fullness signals once again, you'll notice that you feel less hungry overall and naturally start cutting back on the amount you eat.  5. Protein, Produce, and Plant-Based Fats Are Your Weight-Loss  [place] Here's why eating the three Ps regularly will help you drop pounds. Protein fills you up. You need it to build lean muscle, which keeps your metabolism humming so that you can torch more fat. People in a weight-loss program who ate double the recommended daily allowance for protein (about 110 grams for a 150-pound woman) lost 70 percent of their weight from fat, while people who ate the RDA lost only about 40 percent, one study found. Produce is packed with filling fiber. "It's very difficult to consume too many calories if you're eating a lot of vegetables. Example: Three cups of broccoli is a lot of food, yet only 93 calories. (Fruit is another story. It can be easy to overeat and can contain a lot of calories from sugar, so be sure to monitor your intake.) Plant-based fats like olive oil and those in avocados and nuts are healthy and extra satiating.  Use it to lose it. Aim to incorporate each of the three Ps into every meal and snack. People who eat protein throughout the day are able to keep weight off, according to a study in the American Journal of Clinical Nutrition. In addition to meat, poultry and seafood, good sources are beans, lentils, eggs, tofu, and yogurt. As for fat, keep portion sizes in check by measuring out salad dressing, oil, and nut butters (shoot for one to two tablespoons). Finally, eat veggies or a little fruit at every meal. People who did that consumed 308 fewer calories but didn't feel any hungrier than when they didn't eat more produce.  7. How You Eat Is As Important As What You Eat In order for your brain to register that you're full, you need to focus on what you're eating. Sit down whenever you eat, preferably at a table. Turn off the TV or computer, put down your phone, and look at your food. Smell it. Chew slowly, and don't put another bite on your fork until you swallow. When women ate lunch this attentively, they consumed 30 percent less when snacking later than  those who listened to an audiobook at lunchtime, according to a study in the [employer] Journal of Nutrition. 8. Weighing Yourself Really Works The scale provides the best evidence about whether your efforts are paying off. Seeing the numbers tick up or down or stagnate is motivation to keep going-or to rethink your approach. A 2015 study at [employer] found that daily weigh-ins helped people lose more weight, keep it off, and maintain that loss, even after two years. Use it to lose it. Step on the scale at the same time every day for the best results. If your weight shoots up several pounds from one weigh-in to the next, don't freak out. Eating a lot of salt the night before or having your period is the likely culprit. The number should return to normal in a day or two. It's a steady climb that you need to do something about.   9. Too Much Stress and Too Little Sleep Are Your Enemies When you're tired and frazzled, your body cranks up the production of cortisol, the stress hormone that can cause carb cravings. Not getting enough sleep also boosts your levels of ghrelin, a hormone associated with hunger, while suppressing leptin, a hormone that signals fullness and satiety. People on a diet who slept only five and a half hours a night for two weeks lost 55 percent less fat and were hungrier than those who slept eight and a half hours, according to a study in the [employer] Journal. Use it to lose it. Prioritize sleep, aiming for seven hours or more a night, which research shows helps lower stress. And make sure you're getting quality zzz's. If a snoring spouse or a fidgety cat wakes you up frequently throughout the night, you may end up getting the equivalent of just four hours of sleep, according to a study from [employer]. Keep pets out of the bedroom, and use a white-noise app to drown out snoring. 10. You Will Hit a plateau-And You Can Bust Through It As you slim down, your  body releases much less leptin, the fullness hormone.  If you're not strength training, start right now. Building muscle can raise your metabolism to help you overcome a plateau. To keep your body challenged and burning calories, incorporate new moves and more intense intervals into your workouts or add another sweat session to your weekly routine. Alternatively, cut an extra 100 calories or so a day from your diet. Now that you've lost weight, your body simply doesn't need as much fuel.   Ways to cut 100 calories  1. Eat your eggs with hot sauce OR salsa instead of cheese.  Eggs are great for breakfast, but many people consider eggs and cheese to be BFFs. Instead of cheese-1 oz. of cheddar has 114 calories-top your eggs with hot sauce, which contains no calories and helps with satiety and metabolism. Salsa is also a great option!!  2. Top your toast, waffles or pancakes with mashed berries instead of jelly or syrup. Half a cup of berries-fresh, frozen or thawed-has about 40 calories, compared with 2 tbsp. of maple syrup or jelly, which both have about 100 calories. The berries will also give you a good punch of fiber, which helps keep you full and satisfied and won't spike blood sugar quickly like the jelly or syrup. 3. Swap the non-fat latte for black coffee with a splash of half-and-half. Contrary to its name, that non-fat latte has 130 calories and a startling 19g of carbohydrates per 16 oz. serving. Replacing that 'light' drinkable dessert with a black coffee with a splash of half-and-half saves you more than 100 calories per 16 oz. serving. 4. Sprinkle salads with freeze-dried raspberries instead of dried cranberries. If you want a sweet addition to your nutritious salad, stay away from dried cranberries. They have a whopping 130 calories per  cup and 30g carbohydrates. Instead, sprinkle freeze-dried raspberries guilt-free and save more than 100 calories per  cup serving, adding 3g of belly-filling  fiber. 5. Go for mustard in place of mayo on your sandwich. Mustard can add really nice flavor to any sandwich, and there are tons of varieties, from spicy to honey. A serving of mayo is 95 calories, versus 10 calories in a serving of mustard. 6. Choose a DIY salad dressing instead of the store-bought kind. Mix Dijon or whole grain mustard with low-fat Kefir or red wine vinegar   and garlic. 7. Use hummus as a spread instead of a dip. Use hummus as a spread on a high-fiber cracker or tortilla with a sandwich and save on calories without sacrificing taste. 8. Pick just one salad "accessory." Salad isn't automatically a calorie winner. It's easy to over-accessorize with toppings. Instead of topping your salad with nuts, avocado and cranberries (all three will clock in at 313 calories), just pick one. The next day, choose a different accessory, which will also keep your salad interesting. You don't wear all your jewelry every day, right? 9. Ditch the white pasta in favor of spaghetti squash. One cup of cooked spaghetti squash has about 40 calories, compared with traditional spaghetti, which comes with more than 200. Spaghetti squash is also nutrient-dense. It's a good source of fiber and Vitamins A and C, and it can be eaten just like you would eat pasta-with a great tomato sauce and turkey meatballs or with pesto, tofu and spinach, for example. 10. Dress up your chili, soups and stews with non-fat Greek yogurt instead of sour cream. Just a 'dollop' of sour cream can set you back 115 calories and a whopping 12g of fat-seven of which are of the artery-clogging variety. Added bonus: Greek yogurt is packed with muscle-building protein, calcium and B Vitamins. 11. Mash cauliflower instead of mashed potatoes. One cup of traditional mashed potatoes-in all their creamy goodness-has more than 200 calories, compared to mashed cauliflower, which you can typically eat for less than 100 calories per 1 cup serving.  Cauliflower is a great source of the antioxidant indole-3-carbinol (I3C), which may help reduce the risk of some cancers, like breast cancer. 12. Ditch the ice cream sundae in favor of a Greek yogurt parfait. Instead of a cup of ice cream or fro-yo for dessert, try 1 cup of nonfat Greek yogurt topped with fresh berries and a sprinkle of cacao nibs. Both toppings are packed with antioxidants, which can help reduce cellular inflammation and oxidative damage. And the comparison is a no-brainer: One cup of ice cream has about 275 calories; one cup of frozen yogurt has about 230; and a cup of Greek yogurt has just 130, plus twice the protein, so you're less likely to return to the freezer for a second helping. 13. Put olive oil in a spray container instead of using it directly from the bottle. Each tablespoon of olive oil is 120 calories and 15g of fat. Use a mister instead of pouring it straight into the pan or onto a salad. This allows for portion control and will save you more than 100 calories. 14. When baking, substitute canned pumpkin for butter or oil. Canned pumpkin-not pumpkin pie mix-is loaded with Vitamin A, which is important for skin and eye health, as well as immunity. And the comparisons are pretty crazy:  cup of canned pumpkin has about 40 calories, compared to butter or oil, which has more than 800 calories. Yes, 800 calories. Applesauce and mashed banana can also serve as good substitutions for butter or oil, usually in a 1:1 ratio. 15. Top casseroles with high-fiber cereal instead of breadcrumbs. Breadcrumbs are typically made with white bread, while breakfast cereals contain 5-9g of fiber per serving. Not only will you save more than 150 calories per  cup serving, the swap will also keep you more full and you'll get a metabolism boost from the added fiber. 16. Snack on pistachios instead of macadamia nuts. Believe it or not, you get the same amount of calories from 35   pistachios (100  calories) as you would from only five macadamia nuts. 17. Chow down on kale chips rather than potato chips. This is my favorite 'don't knock it 'till you try it' swap. Kale chips are so easy to make at home, and you can spice them up with a little grated parmesan or chili powder. Plus, they're a mere fraction of the calories of potato chips, but with the same crunch factor we crave so often. 18. Add seltzer and some fruit slices to your cocktail instead of soda or fruit juice. One cup of soda or fruit juice can pack on as much as 140 calories. Instead, use seltzer and fruit slices. The fruit provides valuable phytochemicals, such as flavonoids and anthocyanins, which help to combat cancer and stave off the aging process.  

## 2014-12-29 LAB — CBC WITH DIFFERENTIAL/PLATELET
Basophils Absolute: 0 10*3/uL (ref 0.0–0.1)
Basophils Relative: 0 % (ref 0–1)
EOS ABS: 0.1 10*3/uL (ref 0.0–0.7)
EOS PCT: 1 % (ref 0–5)
HCT: 42.8 % (ref 36.0–46.0)
Hemoglobin: 14.4 g/dL (ref 12.0–15.0)
LYMPHS ABS: 3 10*3/uL (ref 0.7–4.0)
LYMPHS PCT: 37 % (ref 12–46)
MCH: 28 pg (ref 26.0–34.0)
MCHC: 33.6 g/dL (ref 30.0–36.0)
MCV: 83.3 fL (ref 78.0–100.0)
MPV: 10.7 fL (ref 8.6–12.4)
Monocytes Absolute: 0.6 10*3/uL (ref 0.1–1.0)
Monocytes Relative: 7 % (ref 3–12)
NEUTROS PCT: 55 % (ref 43–77)
Neutro Abs: 4.5 10*3/uL (ref 1.7–7.7)
Platelets: 278 10*3/uL (ref 150–400)
RBC: 5.14 MIL/uL — AB (ref 3.87–5.11)
RDW: 14 % (ref 11.5–15.5)
WBC: 8.2 10*3/uL (ref 4.0–10.5)

## 2014-12-29 LAB — BASIC METABOLIC PANEL WITH GFR
BUN: 14 mg/dL (ref 6–23)
CO2: 23 meq/L (ref 19–32)
CREATININE: 0.84 mg/dL (ref 0.50–1.10)
Calcium: 8.9 mg/dL (ref 8.4–10.5)
Chloride: 105 mEq/L (ref 96–112)
GFR, EST NON AFRICAN AMERICAN: 84 mL/min
GFR, Est African American: >89 mL/min
Glucose, Bld: 75 mg/dL (ref 70–99)
POTASSIUM: 4.2 meq/L (ref 3.5–5.3)
Sodium: 137 mEq/L (ref 135–145)

## 2014-12-29 LAB — TSH: TSH: 1.916 u[IU]/mL (ref 0.350–4.500)

## 2014-12-29 LAB — HEPATIC FUNCTION PANEL
ALT: 11 U/L (ref 0–35)
AST: 12 U/L (ref 0–37)
Albumin: 3.8 g/dL (ref 3.5–5.2)
Alkaline Phosphatase: 48 U/L (ref 39–117)
BILIRUBIN TOTAL: 0.3 mg/dL (ref 0.2–1.2)
Bilirubin, Direct: 0.1 mg/dL (ref 0.0–0.3)
Indirect Bilirubin: 0.2 mg/dL (ref 0.2–1.2)
TOTAL PROTEIN: 7.5 g/dL (ref 6.0–8.3)

## 2014-12-29 LAB — LIPID PANEL
Cholesterol: 166 mg/dL (ref 0–200)
HDL: 50 mg/dL (ref >39)
LDL Cholesterol: 103 mg/dL — ABNORMAL HIGH (ref 0–99)
TRIGLYCERIDES: 64 mg/dL (ref <150)
Total CHOL/HDL Ratio: 3.3 Ratio
VLDL: 13 mg/dL (ref 0–40)

## 2014-12-29 LAB — HEMOGLOBIN A1C
Hgb A1c MFr Bld: 5.9 % — ABNORMAL HIGH (ref <5.7)
Mean Plasma Glucose: 123 mg/dL — ABNORMAL HIGH (ref <117)

## 2014-12-29 LAB — INSULIN, FASTING: INSULIN FASTING, SERUM: 5.5 u[IU]/mL (ref 2.0–19.6)

## 2014-12-29 LAB — MAGNESIUM: Magnesium: 1.9 mg/dL (ref 1.5–2.5)

## 2014-12-29 LAB — VITAMIN D 25 HYDROXY (VIT D DEFICIENCY, FRACTURES): Vit D, 25-Hydroxy: 22 ng/mL — ABNORMAL LOW (ref 30–100)

## 2015-04-30 ENCOUNTER — Encounter: Payer: Self-pay | Admitting: Internal Medicine

## 2015-05-01 ENCOUNTER — Encounter: Payer: Self-pay | Admitting: Emergency Medicine

## 2015-05-16 ENCOUNTER — Ambulatory Visit (INDEPENDENT_AMBULATORY_CARE_PROVIDER_SITE_OTHER): Payer: 59 | Admitting: Physician Assistant

## 2015-05-16 ENCOUNTER — Encounter: Payer: Self-pay | Admitting: Physician Assistant

## 2015-05-16 VITALS — BP 122/82 | HR 68 | Temp 97.7°F | Resp 16 | Ht 69.0 in | Wt 226.0 lb

## 2015-05-16 DIAGNOSIS — J45909 Unspecified asthma, uncomplicated: Secondary | ICD-10-CM

## 2015-05-16 DIAGNOSIS — Z0001 Encounter for general adult medical examination with abnormal findings: Secondary | ICD-10-CM

## 2015-05-16 DIAGNOSIS — R7303 Prediabetes: Secondary | ICD-10-CM

## 2015-05-16 DIAGNOSIS — L709 Acne, unspecified: Secondary | ICD-10-CM

## 2015-05-16 DIAGNOSIS — D649 Anemia, unspecified: Secondary | ICD-10-CM

## 2015-05-16 DIAGNOSIS — I10 Essential (primary) hypertension: Secondary | ICD-10-CM

## 2015-05-16 DIAGNOSIS — E559 Vitamin D deficiency, unspecified: Secondary | ICD-10-CM

## 2015-05-16 DIAGNOSIS — E669 Obesity, unspecified: Secondary | ICD-10-CM

## 2015-05-16 DIAGNOSIS — E785 Hyperlipidemia, unspecified: Secondary | ICD-10-CM

## 2015-05-16 DIAGNOSIS — Z79899 Other long term (current) drug therapy: Secondary | ICD-10-CM

## 2015-05-16 DIAGNOSIS — Z0000 Encounter for general adult medical examination without abnormal findings: Secondary | ICD-10-CM

## 2015-05-16 DIAGNOSIS — R6889 Other general symptoms and signs: Secondary | ICD-10-CM

## 2015-05-16 LAB — CBC WITH DIFFERENTIAL/PLATELET
Basophils Absolute: 0 K/uL (ref 0.0–0.1)
Basophils Relative: 0 % (ref 0–1)
Eosinophils Absolute: 0.1 K/uL (ref 0.0–0.7)
Eosinophils Relative: 1 % (ref 0–5)
HCT: 42.5 % (ref 36.0–46.0)
Hemoglobin: 14.3 g/dL (ref 12.0–15.0)
Lymphocytes Relative: 31 % (ref 12–46)
Lymphs Abs: 2.7 K/uL (ref 0.7–4.0)
MCH: 27.7 pg (ref 26.0–34.0)
MCHC: 33.6 g/dL (ref 30.0–36.0)
MCV: 82.4 fL (ref 78.0–100.0)
MPV: 10.4 fL (ref 8.6–12.4)
Monocytes Absolute: 0.7 K/uL (ref 0.1–1.0)
Monocytes Relative: 8 % (ref 3–12)
Neutro Abs: 5.2 K/uL (ref 1.7–7.7)
Neutrophils Relative %: 60 % (ref 43–77)
Platelets: 262 K/uL (ref 150–400)
RBC: 5.16 MIL/uL — ABNORMAL HIGH (ref 3.87–5.11)
RDW: 14.1 % (ref 11.5–15.5)
WBC: 8.7 K/uL (ref 4.0–10.5)

## 2015-05-16 MED ORDER — BENZOYL PEROXIDE-ERYTHROMYCIN 5-3 % EX GEL: Freq: Two times a day (BID) | CUTANEOUS | Status: DC

## 2015-05-16 NOTE — Progress Notes (Signed)
Complete Physical  Assessment and Plan: 1. Prediabetes Discussed general issues about diabetes pathophysiology and management., Educational material distributed., Suggested low cholesterol diet., Encouraged aerobic exercise., Discussed foot care., Reminded to get yearly retinal exam. - CBC with Differential/Platelet - BASIC METABOLIC PANEL WITH GFR - Hepatic function panel - TSH - Hemoglobin A1c - Insulin, fasting - Urinalysis, Routine w reflex microscopic (not at [facility]) - Microalbumin / creatinine urine ratio - EKG 12-Lead  2. Obesity Obesity with co morbidities- long discussion about weight loss, diet, and exercise  3. Hyperlipidemia LDL goal <130 -continue medications, check lipids, decrease fatty foods, increase activity.  - Lipid panel - EKG 12-Lead  4. Vitamin D deficiency - Vit D  25 hydroxy (rtn osteoporosis monitoring)  5. Asthma, unspecified asthma severity, uncomplicated controlled  6. Routine general medical examination at a health care facility  7. Medication management - Magnesium  8. Anemia, unspecified anemia type - Iron and TIBC - Ferritin  9. Acne, unspecified acne type - benzoyl peroxide-erythromycin (BENZAMYCIN) gel; Apply topically 2 (two) times daily.  Dispense: 23.3 g; Refill: 3  Discussed med's effects and SE's. Screening labs and tests as requested with regular follow-up as recommended. Over 40 minutes of exam, counseling, chart review and critical decision making was performed  HPI  This very nice 43 y.o. AA female presents for complete physical.  Patient has no major health issues.  Patient reports no complaints at this time.  She does workout, goes to zumba, denies CP/SOB. BP: 122/82 mmHg  She has a history of childhood asthma that is well controlled.  Finally, patient has history of Vitamin D Deficiency and last vitamin D was  Lab Results  Component Value Date   VD25OH 22* 12/28/2014  Currently on supplementation, 5000 IU daily She has  a 38 year old son.  BMI is Body mass index is 33.36 kg/(m^2)., she is working on diet and exercise. Wt Readings from Last 3 Encounters:  05/16/15 226 lb (102.513 kg)  12/28/14 228 lb (103.42 kg)  09/20/14 222 lb (100.699 kg)  Due to her obesity she has prediabetes that is being monitored. She denies diabetic polys.  Lab Results  Component Value Date   HGBA1C 5.9* 12/28/2014  She has hyperlipidemia but is not on medication. Her last cholesterol was Lab Results  Component Value Date   CHOL 166 12/28/2014   HDL 50 12/28/2014   LDLCALC 103* 12/28/2014   TRIG 64 12/28/2014   CHOLHDL 3.3 12/28/2014     Current Medications:  Current Outpatient Prescriptions on File Prior to Visit  Medication Sig Dispense Refill  . cholecalciferol (VITAMIN D) 1000 UNITS tablet Take 5,000 Units by mouth daily.    . Multiple Vitamin (MULTIVITAMIN) tablet Take 1 tablet by mouth daily.     No current facility-administered medications on file prior to visit.   Health Maintenance:   Immunization History  Administered Date(s) Administered  . PPD Test 04/25/2014  . Tdap 04/25/2014   Works at [employer], she is in the [employer]. Worked there 9 years, works 8-5   TD/TDAP: 2015 Influenza: 2015 at work PPD 2015 Pneumovax: N/A Prevnar 13: N/A  LMP: Patient's last menstrual period was 05/01/2015 (approximate). Sexually Active: yes STD testing offered but gets at OB/GYN Pap: 2015- Dr. Raphael Gibney [facility] MGM: 2015 Last Dental Exam:  q 6 months  Last Eye Exam: Dr. Len Childs, 2016, glasses  Allergies: No Known Allergies Medical History:  Past Medical History  Diagnosis Date  . Asthma  childhood   Surgical History:  Past Surgical History  Procedure Laterality Date  . Wisdom tooth extraction     Family History:  Family History  Problem Relation Age of Onset  . Hypertension Mother   . Hypertension Father   . Diabetes Maternal Grandmother    Social History:  History   Substance Use Topics  . Smoking status: Never Smoker   . Smokeless tobacco: Never Used  . Alcohol Use: Yes     Comment: occasional wine   Review of Systems: Review of Systems  Constitutional: Negative.   HENT: Negative.   Eyes: Negative.   Respiratory: Negative.   Cardiovascular: Negative.   Gastrointestinal: Negative.   Genitourinary: Negative.   Musculoskeletal: Negative.   Skin: Positive for rash. Negative for itching.  Neurological: Negative.   Endo/Heme/Allergies: Negative.   Psychiatric/Behavioral: Negative.     Physical Exam: Estimated body mass index is 33.36 kg/(m^2) as calculated from the following:   Height as of this encounter: 5\' 9"  (1.753 m).   Weight as of this encounter: 226 lb (102.513 kg). BP 122/82 mmHg  Pulse 68  Temp(Src) 97.7 F (36.5 C)  Resp 16  Ht 5\' 9"  (1.753 m)  Wt 226 lb (102.513 kg)  BMI 33.36 kg/m2  LMP 05/01/2015 (Approximate) General Appearance: Well nourished, in no apparent distress.  Eyes: PERRLA, EOMs, conjunctiva no swelling or erythema, normal fundi and vessels.  Sinuses: No Frontal/maxillary tenderness  ENT/Mouth: Ext aud canals clear, normal light reflex with TMs without erythema, bulging. Good dentition. No erythema, swelling, or exudate on post pharynx. Tonsils not swollen or erythematous. Hearing normal.  Neck: Supple, thyroid normal. No bruits  Respiratory: Respiratory effort normal, BS equal bilaterally without rales, rhonchi, wheezing or stridor.  Cardio: RRR without murmurs, rubs or gallops. Brisk peripheral pulses without edema.  Chest: symmetric, with normal excursions and percussion.  Breasts: Symmetric, without lumps, nipple discharge, retractions.  Abdomen: Soft, nontender, no guarding, rebound, hernias, masses, or organomegaly.  Lymphatics: Non tender without lymphadenopathy.  Genitourinary:  Musculoskeletal: Full ROM all peripheral extremities,5/5 strength, and normal gait.  Skin: Acne on nose. Warm, dry without  rashes, lesions, ecchymosis. Neuro: Cranial nerves intact, reflexes equal bilaterally. Normal muscle tone, no cerebellar symptoms. Sensation intact.  Psych: Awake and oriented X 3, normal affect, Insight and Judgment appropriate.   EKG: WNL  Vicie Mutters 10:28 AM [facility]

## 2015-05-16 NOTE — Patient Instructions (Signed)
Bad carbs also include fruit juice, alcohol, and sweet tea. These are empty calories that do not signal to your brain that you are full.   Please remember the good carbs are still carbs which convert into sugar. So please measure them out no more than 1/2-1 cup of rice, oatmeal, pasta, and beans  Veggies are however free foods! Pile them on.   Not all fruit is created equal. Please see the list below, the fruit at the bottom is higher in sugars than the fruit at the top. Please avoid all dried fruits.   Before you even begin to attack a weight-loss plan, it pays to remember this: You are not fat. You have fat. Losing weight isn't about blame or shame; it's simply another achievement to accomplish. Dieting is like any other skill-you have to buckle down and work at it. As long as you act in a smart, reasonable way, you'll ultimately get where you want to be. Here are some weight loss pearls for you.  1. It's Not a Diet. It's a Lifestyle Thinking of a diet as something you're on and suffering through only for the short term doesn't work. To shed weight and keep it off, you need to make permanent changes to the way you eat. It's OK to indulge occasionally, of course, but if you cut calories temporarily and then revert to your old way of eating, you'll gain back the weight quicker than you can say yo-yo. Use it to lose it. Research shows that one of the best predictors of long-term weight loss is how many pounds you drop in the first month. For that reason, nutritionists often suggest being stricter for the first two weeks of your new eating strategy to build momentum. Cut out added sugar and alcohol and avoid unrefined carbs. After that, figure out how you can reincorporate them in a way that's healthy and maintainable.  2. There's a Right Way to Exercise Working out burns calories and fat and boosts your metabolism by building muscle. But those trying to lose weight are notorious for overestimating  the number of calories they burn and underestimating the amount they take in. Unfortunately, your system is biologically programmed to hold on to extra pounds and that means when you start exercising, your body senses the deficit and ramps up its hunger signals. If you're not diligent, you'll eat everything you burn and then some. Use it to lose it. Cardio gets all the exercise glory, but strength and interval training are the real heroes. They help you build lean muscle, which in turn increases your metabolism and calorie-burning ability 3. Don't Overreact to Mild Hunger Some people have a hard time losing weight because of hunger anxiety. To them, being hungry is bad-something to be avoided at all costs-so they carry snacks with them and eat when they don't need to. Others eat because they're stressed out or bored. While you never want to get to the point of being ravenous (that's when bingeing is likely to happen), a hunger pang, a craving, or the fact that it's 3:00 p.m. should not send you racing for the vending machine or obsessing about the energy bar in your purse. Ideally, you should put off eating until your stomach is growling and it's difficult to concentrate.  Use it to lose it. When you feel the urge to eat, use the HALT method. Ask yourself, Am I really hungry? Or am I angry or anxious, lonely or bored, or tired? If you're  still not certain, try the apple test. If you're truly hungry, an apple should seem delicious; if it doesn't, something else is going on. Or you can try drinking water and making yourself busy, if you are still hungry try a healthy snack.  4. Not All Calories Are Created Equal The mechanics of weight loss are pretty simple: Take in fewer calories than you use for energy. But the kind of food you eat makes all the difference. Processed food that's high in saturated fat and refined starch or sugar can cause inflammation that disrupts the hormone signals that tell your brain  you're full. The result: You eat a lot more.  Use it to lose it. Clean up your diet. Swap in whole, unprocessed foods, including vegetables, lean protein, and healthy fats that will fill you up and give you the biggest nutritional bang for your calorie buck. In a few weeks, as your brain starts receiving regular hunger and fullness signals once again, you'll notice that you feel less hungry overall and naturally start cutting back on the amount you eat.  5. Protein, Produce, and Plant-Based Fats Are Your Weight-Loss Trinity Here's why eating the three Ps regularly will help you drop pounds. Protein fills you up. You need it to build lean muscle, which keeps your metabolism humming so that you can torch more fat. People in a weight-loss program who ate double the recommended daily allowance for protein (about 110 grams for a 150-pound woman) lost 70 percent of their weight from fat, while people who ate the RDA lost only about 40 percent, one study found. Produce is packed with filling fiber. "It's very difficult to consume too many calories if you're eating a lot of vegetables. Example: Three cups of broccoli is a lot of food, yet only 93 calories. (Fruit is another story. It can be easy to overeat and can contain a lot of calories from sugar, so be sure to monitor your intake.) Plant-based fats like olive oil and those in avocados and nuts are healthy and extra satiating.  Use it to lose it. Aim to incorporate each of the three Ps into every meal and snack. People who eat protein throughout the day are able to keep weight off, according to a study in the [place] of Clinical Nutrition. In addition to meat, poultry and seafood, good sources are beans, lentils, eggs, tofu, and yogurt. As for fat, keep portion sizes in check by measuring out salad dressing, oil, and nut butters (shoot for one to two tablespoons). Finally, eat veggies or a little fruit at every meal. People who did that consumed 308  fewer calories but didn't feel any hungrier than when they didn't eat more produce.  7. How You Eat Is As Important As What You Eat In order for your brain to register that you're full, you need to focus on what you're eating. Sit down whenever you eat, preferably at a table. Turn off the TV or computer, put down your phone, and look at your food. Smell it. Chew slowly, and don't put another bite on your fork until you swallow. When women ate lunch this attentively, they consumed 30 percent less when snacking later than those who listened to an audiobook at lunchtime, according to a study in the [place] of Nutrition. 8. Weighing Yourself Really Works The scale provides the best evidence about whether your efforts are paying off. Seeing the numbers tick up or down or stagnate is motivation to keep going-or to rethink  your approach. A 2015 study at [facility] found that daily weigh-ins helped people lose more weight, keep it off, and maintain that loss, even after two years. Use it to lose it. Step on the scale at the same time every day for the best results. If your weight shoots up several pounds from one weigh-in to the next, don't freak out. Eating a lot of salt the night before or having your period is the likely culprit. The number should return to normal in a day or two. It's a steady climb that you need to do something about. 9. Too Much Stress and Too Little Sleep Are Your Enemies When you're tired and frazzled, your body cranks up the production of cortisol, the stress hormone that can cause carb cravings. Not getting enough sleep also boosts your levels of ghrelin, a hormone associated with hunger, while suppressing leptin, a hormone that signals fullness and satiety. People on a diet who slept only five and a half hours a night for two weeks lost 55 percent less fat and were hungrier than those who slept eight and a half hours, according to a study in the [place]. Use it to lose it. Prioritize sleep, aiming for seven hours or more a night, which research shows helps lower stress. And make sure you're getting quality zzz's. If a snoring spouse or a fidgety cat wakes you up frequently throughout the night, you may end up getting the equivalent of just four hours of sleep, according to a study from [facility]. Keep pets out of the bedroom, and use a white-noise app to drown out snoring. 10. You Will Hit a plateau-And You Can Bust Through It As you slim down, your body releases much less leptin, the fullness hormone.  If you're not strength training, start right now. Building muscle can raise your metabolism to help you overcome a plateau. To keep your body challenged and burning calories, incorporate new moves and more intense intervals into your workouts or add another sweat session to your weekly routine. Alternatively, cut an extra 100 calories or so a day from your diet. Now that you've lost weight, your body simply doesn't need as much fuel.   Ways to cut 100 calories  1. Eat your eggs with hot sauce OR salsa instead of cheese.  Eggs are great for breakfast, but many people consider eggs and cheese to be BFFs. Instead of cheese-1 oz. of cheddar has 114 calories-top your eggs with hot sauce, which contains no calories and helps with satiety and metabolism. Salsa is also a great option!!  2. Top your toast, waffles or pancakes with mashed berries instead of jelly or syrup. Half a cup of berries-fresh, frozen or thawed-has about 40 calories, compared with 2 tbsp. of maple syrup or jelly, which both have about 100 calories. The berries will also give you a good punch of fiber, which helps keep you full and satisfied and won't spike blood sugar quickly like the jelly or syrup. 3. Swap the non-fat latte for black coffee with a splash of half-and-half. Contrary to its name, that non-fat latte has 130 calories and a startling 19g of carbohydrates per 16  oz. serving. Replacing that 'light' drinkable dessert with a black coffee with a splash of half-and-half saves you more than 100 calories per 16 oz. serving. 4. Sprinkle salads with freeze-dried raspberries instead of dried cranberries. If you want a sweet addition to your nutritious salad, stay away from dried cranberries.  They have a whopping 130 calories per  cup and 30g carbohydrates. Instead, sprinkle freeze-dried raspberries guilt-free and save more than 100 calories per  cup serving, adding 3g of belly-filling fiber. 5. Go for mustard in place of mayo on your sandwich. Mustard can add really nice flavor to any sandwich, and there are tons of varieties, from spicy to honey. A serving of mayo is 95 calories, versus 10 calories in a serving of mustard. 6. Choose a DIY salad dressing instead of the store-bought kind. Mix Dijon or whole grain mustard with low-fat Kefir or red wine vinegar and garlic. 7. Use hummus as a spread instead of a dip. Use hummus as a spread on a high-fiber cracker or tortilla with a sandwich and save on calories without sacrificing taste. 8. Pick just one salad "accessory." Salad isn't automatically a calorie winner. It's easy to over-accessorize with toppings. Instead of topping your salad with nuts, avocado and cranberries (all three will clock in at 313 calories), just pick one. The next day, choose a different accessory, which will also keep your salad interesting. You don't wear all your jewelry every day, right? 9. Ditch the white pasta in favor of spaghetti squash. One cup of cooked spaghetti squash has about 40 calories, compared with traditional spaghetti, which comes with more than 200. Spaghetti squash is also nutrient-dense. It's a good source of fiber and Vitamins A and C, and it can be eaten just like you would eat pasta-with a great tomato sauce and [place] meatballs or with pesto, tofu and spinach, for example. 10. Dress up your chili, soups and stews with  non-fat [place] yogurt instead of sour cream. Just a 'dollop' of sour cream can set you back 115 calories and a whopping 12g of fat-seven of which are of the artery-clogging variety. Added bonus: [place] yogurt is packed with muscle-building protein, calcium and B Vitamins. 11. Mash cauliflower instead of mashed potatoes. One cup of traditional mashed potatoes-in all their creamy goodness-has more than 200 calories, compared to mashed cauliflower, which you can typically eat for less than 100 calories per 1 cup serving. Cauliflower is a great source of the antioxidant indole-3-carbinol (I3C), which may help reduce the risk of some cancers, like breast cancer. 12. Ditch the ice cream sundae in favor of a [place] yogurt parfait. Instead of a cup of ice cream or fro-yo for dessert, try 1 cup of nonfat Greek yogurt topped with fresh berries and a sprinkle of cacao nibs. Both toppings are packed with antioxidants, which can help reduce cellular inflammation and oxidative damage. And the comparison is a no-brainer: One cup of ice cream has about 275 calories; one cup of frozen yogurt has about 230; and a cup of Greek yogurt has just 130, plus twice the protein, so you're less likely to return to the freezer for a second helping. 13. Put olive oil in a spray container instead of using it directly from the bottle. Each tablespoon of olive oil is 120 calories and 15g of fat. Use a mister instead of pouring it straight into the pan or onto a salad. This allows for portion control and will save you more than 100 calories. 14. When baking, substitute canned pumpkin for butter or oil. Canned pumpkin-not pumpkin pie mix-is loaded with Vitamin A, which is important for skin and eye health, as well as immunity. And the comparisons are pretty crazy:  cup of canned pumpkin has about 40 calories, compared to butter or oil, which has more than  800 calories. Yes, 800 calories. Applesauce and mashed banana can also serve as good  substitutions for butter or oil, usually in a 1:1 ratio. 15. Top casseroles with high-fiber cereal instead of breadcrumbs. Breadcrumbs are typically made with white bread, while breakfast cereals contain 5-9g of fiber per serving. Not only will you save more than 150 calories per  cup serving, the swap will also keep you more full and you'll get a metabolism boost from the added fiber. 16. Snack on pistachios instead of macadamia nuts. Believe it or not, you get the same amount of calories from 35 pistachios (100 calories) as you would from only five macadamia nuts. 17. Chow down on kale chips rather than potato chips. This is my favorite 'don't knock it 'till you try it' swap. Kale chips are so easy to make at home, and you can spice them up with a little grated parmesan or chili powder. Plus, they're a mere fraction of the calories of potato chips, but with the same crunch factor we crave so often. 18. Add seltzer and some fruit slices to your cocktail instead of soda or fruit juice. One cup of soda or fruit juice can pack on as much as 140 calories. Instead, use seltzer and fruit slices. The fruit provides valuable phytochemicals, such as flavonoids and anthocyanins, which help to combat cancer and stave off the aging process.  We want weight loss that will last so you should lose 1-2 pounds a week.  THAT IS IT! Please pick THREE things a month to change. Once it is a habit check off the item. Then pick another three items off the list to become habits.  If you are already doing a habit on the list GREAT!  Cross that item off! o Don't drink your calories. Ie, alcohol, soda, fruit juice, and sweet tea.  o Drink more water. Drink a glass when you feel hungry or before each meal.  o Eat breakfast - Complex carb and protein (likeDannon light and fit yogurt, oatmeal, fruit, eggs, [place] bacon). o Measure your cereal.  Eat no more than one cup a day. (ie [place]) o Eat an apple a day. o Add a vegetable a  day. o Try a new vegetable a month. o Use Pam! Stop using oil or butter to cook. o Don't finish your plate or use smaller plates. o Share your dessert. o Eat sugar free Jello for dessert or frozen grapes. o Don't eat 2-3 hours before bed. o Switch to whole wheat bread, pasta, and brown rice. o Make healthier choices when you eat out. No fries! o Pick baked chicken, NOT fried. o Don't forget to SLOW DOWN when you eat. It is not going anywhere.  o Take the stairs. o Park far away in the parking lot o [employer] (or weights) for 10 minutes while watching TV. o Walk at work for 10 minutes during break. o Walk outside 1 time a week with your friend, kids, dog, or significant other. o Start a walking group at [place] the mall as much as you can tolerate.  o Keep a food diary. o Weigh yourself daily. o Walk for 15 minutes 3 days per week. o Cook at home more often and eat out less.  If life happens and you go back to old habits, it is okay.  Just start over. You can do it!   If you experience chest pain, get short of breath, or tired during the exercise, please stop  immediately and inform your doctor.   Remember exercise is great for your cardiovascular health and can help with weight loss but YOU CAN NOT OUT RUN YOUR FORK!

## 2015-05-17 LAB — URINALYSIS, ROUTINE W REFLEX MICROSCOPIC
BILIRUBIN URINE: NEGATIVE
Glucose, UA: NEGATIVE mg/dL
Leukocytes, UA: NEGATIVE
Nitrite: NEGATIVE
PROTEIN: NEGATIVE mg/dL
Specific Gravity, Urine: 1.008 (ref 1.005–1.030)
UROBILINOGEN UA: 0.2 mg/dL (ref 0.0–1.0)
pH: 6 (ref 5.0–8.0)

## 2015-05-17 LAB — HEPATIC FUNCTION PANEL
ALBUMIN: 3.5 g/dL (ref 3.5–5.2)
ALK PHOS: 46 U/L (ref 39–117)
ALT: 16 U/L (ref 0–35)
AST: 16 U/L (ref 0–37)
BILIRUBIN DIRECT: 0.1 mg/dL (ref 0.0–0.3)
BILIRUBIN TOTAL: 0.5 mg/dL (ref 0.2–1.2)
Indirect Bilirubin: 0.4 mg/dL (ref 0.2–1.2)
Total Protein: 7 g/dL (ref 6.0–8.3)

## 2015-05-17 LAB — BASIC METABOLIC PANEL WITH GFR
BUN: 11 mg/dL (ref 6–23)
CO2: 27 mEq/L (ref 19–32)
Calcium: 8.8 mg/dL (ref 8.4–10.5)
Chloride: 106 mEq/L (ref 96–112)
Creat: 0.68 mg/dL (ref 0.50–1.10)
Glucose, Bld: 75 mg/dL (ref 70–99)
Potassium: 4.2 mEq/L (ref 3.5–5.3)
SODIUM: 140 meq/L (ref 135–145)

## 2015-05-17 LAB — LIPID PANEL
Cholesterol: 163 mg/dL (ref 0–200)
HDL: 49 mg/dL (ref >=46)
LDL CALC: 101 mg/dL — AB (ref 0–99)
TRIGLYCERIDES: 66 mg/dL (ref <150)
Total CHOL/HDL Ratio: 3.3 Ratio
VLDL: 13 mg/dL (ref 0–40)

## 2015-05-17 LAB — URINALYSIS, MICROSCOPIC ONLY
Bacteria, UA: NONE SEEN
Casts: NONE SEEN
Crystals: NONE SEEN
Squamous Epithelial / LPF: NONE SEEN

## 2015-05-17 LAB — TSH: TSH: 1.28 u[IU]/mL (ref 0.350–4.500)

## 2015-05-17 LAB — HEMOGLOBIN A1C
Hgb A1c MFr Bld: 5.9 % — ABNORMAL HIGH (ref <5.7)
Mean Plasma Glucose: 123 mg/dL — ABNORMAL HIGH (ref <117)

## 2015-05-17 LAB — INSULIN, FASTING: Insulin fasting, serum: 5.8 u[IU]/mL (ref 2.0–19.6)

## 2015-05-17 LAB — IRON AND TIBC
%SAT: 36 % (ref 20–55)
Iron: 107 ug/dL (ref 42–145)
TIBC: 295 ug/dL (ref 250–470)
UIBC: 188 ug/dL (ref 125–400)

## 2015-05-17 LAB — MICROALBUMIN / CREATININE URINE RATIO
Creatinine, Urine: 113.6 mg/dL
MICROALB UR: 0.2 mg/dL (ref <2.0)
Microalb Creat Ratio: 1.8 mg/g (ref 0.0–30.0)

## 2015-05-17 LAB — VITAMIN D 25 HYDROXY (VIT D DEFICIENCY, FRACTURES): VIT D 25 HYDROXY: 35 ng/mL (ref 30–100)

## 2015-05-17 LAB — MAGNESIUM: Magnesium: 1.8 mg/dL (ref 1.5–2.5)

## 2015-05-17 LAB — FERRITIN: FERRITIN: 51 ng/mL (ref 10–291)

## 2015-10-14 ENCOUNTER — Encounter: Payer: Self-pay | Admitting: *Deleted

## 2015-11-15 ENCOUNTER — Ambulatory Visit: Payer: Self-pay | Admitting: Physician Assistant

## 2016-05-20 ENCOUNTER — Encounter: Payer: Self-pay | Admitting: Physician Assistant

## 2016-05-28 ENCOUNTER — Encounter: Payer: Self-pay | Admitting: Physician Assistant

## 2016-05-28 ENCOUNTER — Ambulatory Visit (INDEPENDENT_AMBULATORY_CARE_PROVIDER_SITE_OTHER): Payer: 59 | Admitting: Physician Assistant

## 2016-05-28 VITALS — BP 132/64 | HR 55 | Temp 97.3°F | Resp 14 | Ht 69.0 in | Wt 234.2 lb

## 2016-05-28 DIAGNOSIS — Z0000 Encounter for general adult medical examination without abnormal findings: Secondary | ICD-10-CM

## 2016-05-28 DIAGNOSIS — E669 Obesity, unspecified: Secondary | ICD-10-CM

## 2016-05-28 DIAGNOSIS — J45909 Unspecified asthma, uncomplicated: Secondary | ICD-10-CM

## 2016-05-28 DIAGNOSIS — Z0001 Encounter for general adult medical examination with abnormal findings: Secondary | ICD-10-CM

## 2016-05-28 DIAGNOSIS — E785 Hyperlipidemia, unspecified: Secondary | ICD-10-CM

## 2016-05-28 DIAGNOSIS — Z136 Encounter for screening for cardiovascular disorders: Secondary | ICD-10-CM | POA: Diagnosis not present

## 2016-05-28 DIAGNOSIS — L709 Acne, unspecified: Secondary | ICD-10-CM

## 2016-05-28 DIAGNOSIS — R7303 Prediabetes: Secondary | ICD-10-CM

## 2016-05-28 DIAGNOSIS — Z79899 Other long term (current) drug therapy: Secondary | ICD-10-CM

## 2016-05-28 DIAGNOSIS — I10 Essential (primary) hypertension: Secondary | ICD-10-CM

## 2016-05-28 DIAGNOSIS — E559 Vitamin D deficiency, unspecified: Secondary | ICD-10-CM

## 2016-05-28 DIAGNOSIS — D649 Anemia, unspecified: Secondary | ICD-10-CM

## 2016-05-28 LAB — CBC WITH DIFFERENTIAL/PLATELET
BASOS ABS: 0 {cells}/uL (ref 0–200)
Basophils Relative: 0 %
EOS PCT: 2 %
Eosinophils Absolute: 152 cells/uL (ref 15–500)
HEMATOCRIT: 43.3 % (ref 35.0–45.0)
HEMOGLOBIN: 14.4 g/dL (ref 11.7–15.5)
Lymphocytes Relative: 35 %
Lymphs Abs: 2660 cells/uL (ref 850–3900)
MCH: 27.6 pg (ref 27.0–33.0)
MCHC: 33.3 g/dL (ref 32.0–36.0)
MCV: 83 fL (ref 80.0–100.0)
MONO ABS: 532 {cells}/uL (ref 200–950)
MPV: 11.2 fL (ref 7.5–12.5)
Monocytes Relative: 7 %
NEUTROS ABS: 4256 {cells}/uL (ref 1500–7800)
NEUTROS PCT: 56 %
Platelets: 299 10*3/uL (ref 140–400)
RBC: 5.22 MIL/uL — AB (ref 3.80–5.10)
RDW: 14.6 % (ref 11.0–15.0)
WBC: 7.6 10*3/uL (ref 3.8–10.8)

## 2016-05-28 LAB — HEMOGLOBIN A1C
Hgb A1c MFr Bld: 5.9 % — ABNORMAL HIGH (ref <5.7)
MEAN PLASMA GLUCOSE: 123 mg/dL

## 2016-05-28 NOTE — Progress Notes (Signed)
Complete Physical  Assessment and Plan: 1. Prediabetes Discussed general issues about diabetes pathophysiology and management., Educational material distributed., Suggested low cholesterol diet., Encouraged aerobic exercise., Discussed foot care., Reminded to get yearly retinal exam. - CBC with Differential/Platelet - BASIC METABOLIC PANEL WITH GFR - Hepatic function panel - TSH - Hemoglobin A1c - Insulin, fasting - EKG 12-Lead  2. Obesity Obesity with co morbidities- long discussion about weight loss, diet, and exercise  3. Hyperlipidemia LDL goal <130 -continue medications, check lipids, decrease fatty foods, increase activity.  - Lipid panel - EKG 12-Lead  4. Vitamin D deficiency - Vit D  25 hydroxy (rtn osteoporosis monitoring)  5. Asthma, unspecified asthma severity, uncomplicated controlled  6. Routine general medical examination at a health care facility  7. Medication management - Magnesium   Discussed med's effects and SE's. Screening labs and tests as requested with regular follow-up as recommended. Over 40 minutes of exam, counseling, chart review and critical decision making was performed  HPI  This very nice 45 y.o. AA female presents for complete physical.   She does workout, goes to zumba, not as often as she would like, denies CP/SOB. BP: 132/64 mmHg  She has a history of childhood asthma that is well controlled.  Finally, patient has history of Vitamin D Deficiency and last vitamin D was  Lab Results  Component Value Date   VD25OH 35 05/16/2015  Currently on supplementation, 5000 IU daily She has a 33 year old son, gets learners BMI is Body mass index is 34.57 kg/(m^2)., she is working on diet and exercise. Wt Readings from Last 3 Encounters:  05/28/16 234 lb 3.2 oz (106.232 kg)  05/16/15 226 lb (102.513 kg)  12/28/14 228 lb (103.42 kg)  Due to her obesity she has prediabetes that is being monitored. She denies diabetic polys.  Lab Results   Component Value Date   HGBA1C 5.9* 05/16/2015  She has hyperlipidemia but is not on medication. Her last cholesterol was Lab Results  Component Value Date   CHOL 163 05/16/2015   HDL 49 05/16/2015   LDLCALC 101* 05/16/2015   TRIG 66 05/16/2015   CHOLHDL 3.3 05/16/2015     Current Medications:  Current Outpatient Prescriptions on File Prior to Visit  Medication Sig Dispense Refill  . GILDESS 1/20 1-20 MG-MCG tablet TAKE ONE TABLET BY MOUTH ONE TIME DAILY. NO REFILLS UNTIL OFFICE VISIT.  0  . Multiple Vitamin (MULTIVITAMIN) tablet Take 1 tablet by mouth daily.     No current facility-administered medications on file prior to visit.   Health Maintenance:   Immunization History  Administered Date(s) Administered  . Influenza-Unspecified 09/13/2015  . PPD Test 04/25/2014  . Tdap 04/25/2014, 09/13/2015   Works at [employer], she is in the [employer]. Worked there 10 years, works 8-5   TD/TDAP: 2015 Influenza: 2016 at work PPD 2015 Pneumovax: N/A Prevnar 13: N/A  LMP: Patient's last menstrual period was 04/30/2016. Pap: 2016- Dr. Raphael Gibney with [facility], normal pap MGM: 2016 Last Dental Exam:  q 6 months  Last Eye Exam: Dr. Len Childs, 2017, glasses  Medical History:  Past Medical History  Diagnosis Date  . Asthma     childhood   Allergies No Known Allergies  SURGICAL HISTORY She  has past surgical history that includes Wisdom tooth extraction. FAMILY HISTORY Her family history includes Diabetes in her maternal grandmother; Hypertension in her father and mother. SOCIAL HISTORY She  reports that she has never smoked. She has never used smokeless  tobacco. She reports that she drinks alcohol. She reports that she does not use illicit drugs.  Review of Systems: Review of Systems  Constitutional: Negative.   HENT: Negative.   Eyes: Negative.   Respiratory: Negative.   Cardiovascular: Negative.   Gastrointestinal: Negative.   Genitourinary: Negative.    Musculoskeletal: Negative.   Skin: Negative for itching and rash.  Neurological: Negative.   Endo/Heme/Allergies: Negative.   Psychiatric/Behavioral: Negative.     Physical Exam: Estimated body mass index is 34.57 kg/(m^2) as calculated from the following:   Height as of this encounter: 5\' 9"  (1.753 m).   Weight as of this encounter: 234 lb 3.2 oz (106.232 kg). BP 132/64 mmHg  Pulse 55  Temp(Src) 97.3 F (36.3 C) (Temporal)  Resp 14  Ht 5\' 9"  (1.753 m)  Wt 234 lb 3.2 oz (106.232 kg)  BMI 34.57 kg/m2  SpO2 98%  LMP 04/30/2016 General Appearance: Well nourished, in no apparent distress.  Eyes: PERRLA, EOMs, conjunctiva no swelling or erythema, normal fundi and vessels.  Sinuses: No Frontal/maxillary tenderness  ENT/Mouth: Ext aud canals clear, normal light reflex with TMs without erythema, bulging. Good dentition. No erythema, swelling, or exudate on post pharynx. Tonsils not swollen or erythematous. Hearing normal.  Neck: Supple, thyroid normal. No bruits  Respiratory: Respiratory effort normal, BS equal bilaterally without rales, rhonchi, wheezing or stridor.  Cardio: RRR without murmurs, rubs or gallops. Brisk peripheral pulses without edema.  Chest: symmetric, with normal excursions and percussion.  Breasts: defer Abdomen: Soft, nontender, no guarding, rebound, hernias, masses, or organomegaly.  Lymphatics: Non tender without lymphadenopathy.  Genitourinary: defer Musculoskeletal: Full ROM all peripheral extremities,5/5 strength, and normal gait.  Skin: Acne on nose. Warm, dry without rashes, lesions, ecchymosis. Neuro: Cranial nerves intact, reflexes equal bilaterally. Normal muscle tone, no cerebellar symptoms. Sensation intact.  Psych: Awake and oriented X 3, normal affect, Insight and Judgment appropriate.   EKG: WNL  Vicie Mutters 9:14 AM [facility]

## 2016-05-28 NOTE — Patient Instructions (Signed)
Vitamin D goal is between 60-80  Please make sure that you are taking your Vitamin D as directed.   It is very important as a natural anti-inflammatory   helping hair, skin, and nails, as well as reducing stroke and heart attack risk.   It helps your bones and helps with mood.  It also decreases numerous cancer risks so please take it as directed.   Low Vit D is associated with a 200-300% higher risk for CANCER   and 200-300% higher risk for HEART   ATTACK  &  STROKE.    .....................................Marland Kitchen  It is also associated with higher death rate at younger ages,   autoimmune diseases like Rheumatoid arthritis, Lupus, Multiple Sclerosis.     Also many other serious conditions, like depression, Alzheimer's  Dementia, infertility, muscle aches, fatigue, fibromyalgia - just to name a few.  +++++++++++++++++++  Can get liquid vitamin D from [place] here in [place] at  [facility] alternatives [address], [place], [place] [address]  We want weight loss that will last so you should lose 1-2 pounds a week.  THAT IS IT! Please pick THREE things a month to change. Once it is a habit check off the item. Then pick another three items off the list to become habits.  If you are already doing a habit on the list GREAT!  Cross that item off! o Don't drink your calories. Ie, alcohol, soda, fruit juice, and sweet tea.  o Drink more water. Drink a glass when you feel hungry or before each meal.  o Eat breakfast - Complex carb and protein (likeDannon light and fit yogurt, oatmeal, fruit, eggs, [place] bacon). o Measure your cereal.  Eat no more than one cup a day. (ie [place]) o Eat an apple a day. o Add a vegetable a day. o Try a new vegetable a month. o Use Pam! Stop using oil or butter to cook. o Don't finish your plate or use smaller plates. o Share your dessert. o Eat sugar free Jello for dessert or frozen grapes. o Don't eat 2-3 hours before bed. o Switch to whole wheat bread,  pasta, and brown rice. o Make healthier choices when you eat out. No fries! o Pick baked chicken, NOT fried. o Don't forget to SLOW DOWN when you eat. It is not going anywhere.  o Take the stairs. o Park far away in the parking lot o [employer] (or weights) for 10 minutes while watching TV. o Walk at work for 10 minutes during break. o Walk outside 1 time a week with your friend, kids, dog, or significant other. o Start a walking group at [place] the mall as much as you can tolerate.  o Keep a food diary. o Weigh yourself daily. o Walk for 15 minutes 3 days per week. o Cook at home more often and eat out less.  If life happens and you go back to old habits, it is okay.  Just start over. You can do it!   If you experience chest pain, get short of breath, or tired during the exercise, please stop immediately and inform your doctor.      Bad carbs also include fruit juice, alcohol, and sweet tea. These are empty calories that do not signal to your brain that you are full.   Please remember the good carbs are still carbs which convert into sugar. So please measure them out no more than 1/2-1 cup of rice, oatmeal, pasta, and  beans  Veggies are however free foods! Pile them on.   Not all fruit is created equal. Please see the list below, the fruit at the bottom is higher in sugars than the fruit at the top. Please avoid all dried fruits.

## 2016-05-29 LAB — TSH: TSH: 1.78 m[IU]/L

## 2016-05-29 LAB — LIPID PANEL
CHOLESTEROL: 159 mg/dL (ref 125–200)
HDL: 55 mg/dL (ref >=46)
LDL Cholesterol: 93 mg/dL (ref <130)
TRIGLYCERIDES: 53 mg/dL (ref <150)
Total CHOL/HDL Ratio: 2.9 Ratio (ref <=5.0)
VLDL: 11 mg/dL (ref <30)

## 2016-05-29 LAB — HEPATIC FUNCTION PANEL
ALK PHOS: 49 U/L (ref 33–115)
ALT: 12 U/L (ref 6–29)
AST: 13 U/L (ref 10–35)
Albumin: 3.6 g/dL (ref 3.6–5.1)
BILIRUBIN DIRECT: 0.1 mg/dL (ref <=0.2)
BILIRUBIN INDIRECT: 0.3 mg/dL (ref 0.2–1.2)
BILIRUBIN TOTAL: 0.4 mg/dL (ref 0.2–1.2)
TOTAL PROTEIN: 6.9 g/dL (ref 6.1–8.1)

## 2016-05-29 LAB — BASIC METABOLIC PANEL WITH GFR
BUN: 11 mg/dL (ref 7–25)
CO2: 25 mmol/L (ref 20–31)
Calcium: 8.4 mg/dL — ABNORMAL LOW (ref 8.6–10.2)
Chloride: 105 mmol/L (ref 98–110)
Creat: 0.8 mg/dL (ref 0.50–1.10)
GFR, EST NON AFRICAN AMERICAN: 89 mL/min (ref >=60)
GLUCOSE: 92 mg/dL (ref 65–99)
POTASSIUM: 4.3 mmol/L (ref 3.5–5.3)
Sodium: 138 mmol/L (ref 135–146)

## 2016-05-29 LAB — VITAMIN D 25 HYDROXY (VIT D DEFICIENCY, FRACTURES): VIT D 25 HYDROXY: 24 ng/mL — AB (ref 30–100)

## 2016-05-29 LAB — INSULIN, FASTING: INSULIN FASTING, SERUM: 5.4 u[IU]/mL (ref 2.0–19.6)

## 2016-05-29 LAB — MAGNESIUM: MAGNESIUM: 2 mg/dL (ref 1.5–2.5)

## 2017-05-29 NOTE — Progress Notes (Signed)
Complete Physical  Assessment and Plan:  Prediabetes Discussed general issues about diabetes pathophysiology and management., Educational material distributed., Suggested low cholesterol diet., Encouraged aerobic exercise., Discussed foot care., Reminded to get yearly retinal exam. - CBC with Differential/Platelet - BASIC METABOLIC PANEL WITH GFR - Hepatic function panel - TSH - Hemoglobin A1c - Insulin, fasting - EKG 12-Lead  Obesity Obesity with co morbidities- long discussion about weight loss, diet, and exercise  Hyperlipidemia LDL goal <130 -continue medications, check lipids, decrease fatty foods, increase activity.  - Lipid panel - EKG 12-Lead   Vitamin D deficiency - Vit D  25 hydroxy (rtn osteoporosis monitoring)   Asthma, unspecified asthma severity, uncomplicated controlled   Routine general medical examination at a health care facility  Medication management -     CBC with Differential/Platelet -     BASIC METABOLIC PANEL WITH GFR -     Hepatic function panel -     Magnesium  Benign labile hypertension -     Urinalysis, Routine w reflex microscopic -     Microalbumin / creatinine urine ratio  Suprapubic mass Likely fibroids but very large and patient has menorrhagia, will send for [place] and possible to gYN -     US Transvaginal Non-OB; Future -     US Pelvis Complete; Future   Discussed med's effects and SE's. Screening labs and tests as requested with regular follow-up as recommended. Over 40 minutes of exam, counseling, chart review and critical decision making was performed  HPI  This very nice 46 y.o. AA female presents for complete physical.   She does workout, goes to zumba, not as often as she would like, denies CP/SOB. BP: 124/78  She has a history of childhood asthma that is well controlled.  Finally, patient has history of Vitamin D Deficiency and last vitamin D was  Lab Results  Component Value Date   VD25OH 24 (L) 05/28/2016  Currently on  supplementation, 5000 IU daily She has a 6 year old son. BMI is Body mass index is 32.28 kg/m., she is working on diet and exercise. Goal is to get on 200 lb.  Wt Readings from Last 3 Encounters:  06/01/17 221 lb 12.8 oz (100.6 kg)  05/28/16 234 lb 3.2 oz (106.2 kg)  05/16/15 226 lb (102.5 kg)  Due to her obesity she has prediabetes that is being monitored. She denies diabetic polys.  Lab Results  Component Value Date   HGBA1C 5.9 (H) 05/28/2016  She has hyperlipidemia but is not on medication. Her last cholesterol was Lab Results  Component Value Date   CHOL 159 05/28/2016   HDL 55 05/28/2016   LDLCALC 93 05/28/2016   TRIG 53 05/28/2016   CHOLHDL 2.9 05/28/2016     Current Medications:  Current Outpatient Prescriptions on File Prior to Visit  Medication Sig Dispense Refill  . GILDESS 1/20 1-20 MG-MCG tablet TAKE ONE TABLET BY MOUTH ONE TIME DAILY. NO REFILLS UNTIL OFFICE VISIT.  0  . Multiple Vitamin (MULTIVITAMIN) tablet Take 1 tablet by mouth daily.     No current facility-administered medications on file prior to visit.    Health Maintenance:   Immunization History  Administered Date(s) Administered  . Influenza-Unspecified 09/13/2015  . PPD Test 04/25/2014  . Tdap 04/25/2014, 09/13/2015   Works at [employer], she is in the [employer]. Worked there 10 years, works 8-5   TD/TDAP: 2015 Influenza: 2016 at work PPD 2015 Pneumovax: N/A Prevnar 13: N/A  LMP: Patient's last menstrual  period was 05/04/2017. Pap: 2018- [facility] Raphael Gibney [facility], normal pap MGM: 2016 Last Dental Exam:  q 6 months  Last Eye Exam: Dr. Len Childs, 2017, glasses  Medical History:  Past Medical History:  Diagnosis Date  . Asthma    childhood   Allergies No Known Allergies  SURGICAL HISTORY She  has a past surgical history that includes Wisdom tooth extraction. FAMILY HISTORY Her family history includes Diabetes in her maternal grandmother; Hypertension in her father  and mother. SOCIAL HISTORY She  reports that she has never smoked. She has never used smokeless tobacco. She reports that she drinks alcohol. She reports that she does not use drugs.  Review of Systems: Review of Systems  Constitutional: Negative.   HENT: Negative.   Eyes: Negative.   Respiratory: Negative.   Cardiovascular: Negative.   Gastrointestinal: Negative.   Genitourinary: Negative.   Musculoskeletal: Negative.   Skin: Negative for itching and rash.  Neurological: Negative.   Endo/Heme/Allergies: Negative.   Psychiatric/Behavioral: Negative.     Physical Exam: Estimated body mass index is 32.28 kg/m as calculated from the following:   Height as of this encounter: 5' 9.5" (1.765 m).   Weight as of this encounter: 221 lb 12.8 oz (100.6 kg). BP 124/78   Pulse (!) 59   Temp 97.7 F (36.5 C)   Resp 16   Ht 5' 9.5" (1.765 m)   Wt 221 lb 12.8 oz (100.6 kg)   LMP 05/04/2017   SpO2 98%   BMI 32.28 kg/m  General Appearance: Well nourished, in no apparent distress.  Eyes: PERRLA, EOMs, conjunctiva no swelling or erythema, normal fundi and vessels.  Sinuses: No Frontal/maxillary tenderness  ENT/Mouth: Ext aud canals clear, normal light reflex with TMs without erythema, bulging. Good dentition. No erythema, swelling, or exudate on post pharynx. Tonsils not swollen or erythematous. Hearing normal.  Neck: Supple, thyroid normal. No bruits  Respiratory: Respiratory effort normal, BS equal bilaterally without rales, rhonchi, wheezing or stridor.  Cardio: RRR without murmurs, rubs or gallops. Brisk peripheral pulses without edema.  Chest: symmetric, with normal excursions and percussion.  Breasts: defer Abdomen: Soft, nontender, + suprapubic hard nontender mass to umbilicus no guarding, rebound, hernias, masses, or organomegaly.  Lymphatics: Non tender without lymphadenopathy.  Genitourinary: defer Musculoskeletal: Full ROM all peripheral extremities,5/5 strength, and normal  gait.  Skin: Acne on nose. Warm, dry without rashes, lesions, ecchymosis. Neuro: Cranial nerves intact, reflexes equal bilaterally. Normal muscle tone, no cerebellar symptoms. Sensation intact.  Psych: Awake and oriented X 3, normal affect, Insight and Judgment appropriate.   EKG: WNL  Vicie Mutters 9:33 AM [facility]

## 2017-06-01 ENCOUNTER — Ambulatory Visit (INDEPENDENT_AMBULATORY_CARE_PROVIDER_SITE_OTHER): Payer: 59 | Admitting: Physician Assistant

## 2017-06-01 ENCOUNTER — Encounter: Payer: Self-pay | Admitting: Physician Assistant

## 2017-06-01 VITALS — BP 124/78 | HR 59 | Temp 97.7°F | Resp 16 | Ht 69.5 in | Wt 221.8 lb

## 2017-06-01 DIAGNOSIS — E785 Hyperlipidemia, unspecified: Secondary | ICD-10-CM | POA: Diagnosis not present

## 2017-06-01 DIAGNOSIS — Z79899 Other long term (current) drug therapy: Secondary | ICD-10-CM

## 2017-06-01 DIAGNOSIS — R1909 Other intra-abdominal and pelvic swelling, mass and lump: Secondary | ICD-10-CM

## 2017-06-01 DIAGNOSIS — R7303 Prediabetes: Secondary | ICD-10-CM

## 2017-06-01 DIAGNOSIS — R6889 Other general symptoms and signs: Secondary | ICD-10-CM

## 2017-06-01 DIAGNOSIS — I10 Essential (primary) hypertension: Secondary | ICD-10-CM

## 2017-06-01 DIAGNOSIS — Z0001 Encounter for general adult medical examination with abnormal findings: Secondary | ICD-10-CM | POA: Diagnosis not present

## 2017-06-01 DIAGNOSIS — E559 Vitamin D deficiency, unspecified: Secondary | ICD-10-CM | POA: Diagnosis not present

## 2017-06-01 DIAGNOSIS — J45909 Unspecified asthma, uncomplicated: Secondary | ICD-10-CM

## 2017-06-01 LAB — BASIC METABOLIC PANEL WITH GFR
BUN: 13 mg/dL (ref 7–25)
CALCIUM: 9.2 mg/dL (ref 8.6–10.2)
CO2: 23 mmol/L (ref 20–31)
CREATININE: 0.81 mg/dL (ref 0.50–1.10)
Chloride: 106 mmol/L (ref 98–110)
GFR, Est African American: >89 mL/min (ref >=60)
GFR, Est Non African American: 87 mL/min (ref >=60)
GLUCOSE: 91 mg/dL (ref 65–99)
Potassium: 4.4 mmol/L (ref 3.5–5.3)
Sodium: 140 mmol/L (ref 135–146)

## 2017-06-01 LAB — CBC WITH DIFFERENTIAL/PLATELET
BASOS ABS: 0 {cells}/uL (ref 0–200)
Basophils Relative: 0 %
EOS ABS: 73 {cells}/uL (ref 15–500)
EOS PCT: 1 %
HCT: 46 % — ABNORMAL HIGH (ref 35.0–45.0)
HEMOGLOBIN: 15.2 g/dL (ref 11.7–15.5)
LYMPHS ABS: 2701 {cells}/uL (ref 850–3900)
Lymphocytes Relative: 37 %
MCH: 28.3 pg (ref 27.0–33.0)
MCHC: 33 g/dL (ref 32.0–36.0)
MCV: 85.5 fL (ref 80.0–100.0)
MPV: 11.7 fL (ref 7.5–12.5)
Monocytes Absolute: 438 cells/uL (ref 200–950)
Monocytes Relative: 6 %
NEUTROS PCT: 56 %
Neutro Abs: 4088 cells/uL (ref 1500–7800)
Platelets: 306 10*3/uL (ref 140–400)
RBC: 5.38 MIL/uL — ABNORMAL HIGH (ref 3.80–5.10)
RDW: 14.8 % (ref 11.0–15.0)
WBC: 7.3 10*3/uL (ref 3.8–10.8)

## 2017-06-01 LAB — LIPID PANEL
CHOLESTEROL: 183 mg/dL (ref <200)
HDL: 57 mg/dL (ref >50)
LDL Cholesterol: 112 mg/dL — ABNORMAL HIGH (ref <100)
Total CHOL/HDL Ratio: 3.2 Ratio (ref <5.0)
Triglycerides: 72 mg/dL (ref <150)
VLDL: 14 mg/dL (ref <30)

## 2017-06-01 LAB — HEPATIC FUNCTION PANEL
ALT: 11 U/L (ref 6–29)
AST: 12 U/L (ref 10–35)
Albumin: 3.6 g/dL (ref 3.6–5.1)
Alkaline Phosphatase: 47 U/L (ref 33–115)
Bilirubin, Direct: 0.1 mg/dL (ref <=0.2)
Indirect Bilirubin: 0.3 mg/dL (ref 0.2–1.2)
TOTAL PROTEIN: 7.5 g/dL (ref 6.1–8.1)
Total Bilirubin: 0.4 mg/dL (ref 0.2–1.2)

## 2017-06-01 LAB — TSH: TSH: 1.51 m[IU]/L

## 2017-06-01 NOTE — Patient Instructions (Addendum)
Drink 100-120 oz of water a day Eat 3 meals a day Can do protein shake, can do boiled eggs, eggs, protein bars Add veggies, cucumbers, peppers, etc   Simple math prevails.    1st - exercise does not produce significant weight loss - at best one converts fat into muscle , "bulks up", loses inches, but usually stays "weight neutral"     2nd - think of your body weightas a check book: If you eat more calories than you burn up - you save money or gain weight .... Or if you spend more money than you put in the check book, ie burn up more calories than you eat, then you lose weight     3rd - if you walk or run 1 mile, you burn up 100 calories - you have to burn up 3,500 calories to lose 1 pound, ie you have to walk/run 35 miles to lose 1 measly pound. So if you want to lose 10 #, then you have to walk/run 350 miles, so.... clearly exercise is not the solution.     4. So if you consume 1,500 calories, then you have to burn up the equivalent of 15 miles to stay weight neutral - It also stands to reason that if you consume 1,500 cal/day and don't lose weight, then you must be burning up about 1,500 cals/day to stay weight neutral.     5. If you really want to lose weight, you must cut your calorie intake 300 calories /day and at that rate you should lose about 1 # every 3 days.   6. Please purchase Sarah Warren book(s) "The End of Dieting" & "Eat to Live" . It has some great concepts and recipes.      We want weight loss that will last so you should lose 1-2 pounds a week.  THAT IS IT! Please pick THREE things a month to change. Once it is a habit check off the item. Then pick another three items off the list to become habits.  If you are already doing a habit on the list GREAT!  Cross that item off! o Don't drink your calories. Ie, alcohol, soda, fruit juice, and sweet tea.  o Drink more water. Drink a glass when you feel hungry or before each meal.  o Eat breakfast - Complex carb and protein  (likeDannon light and fit yogurt, oatmeal, fruit, eggs, [place] bacon). o Measure your cereal.  Eat no more than one cup a day. (ie [place]) o Eat an apple a day. o Add a vegetable a day. o Try a new vegetable a month. o Use Pam! Stop using oil or butter to cook. o Don't finish your plate or use smaller plates. o Share your dessert. o Eat sugar free Jello for dessert or frozen grapes. o Don't eat 2-3 hours before bed. o Switch to whole wheat bread, pasta, and brown rice. o Make healthier choices when you eat out. No fries! o Pick baked chicken, NOT fried. o Don't forget to SLOW DOWN when you eat. It is not going anywhere.  o Take the stairs. o Park far away in the parking lot o [employer] (or weights) for 10 minutes while watching TV. o Walk at work for 10 minutes during break. o Walk outside 1 time a week with your friend, kids, dog, or significant other. o Start a walking group at [place] the mall as much as you can tolerate.  o Keep a food  diary. o Weigh yourself daily. o Walk for 15 minutes 3 days per week. o Cook at home more often and eat out less.  If life happens and you go back to old habits, it is okay.  Just start over. You can do it!   If you experience chest pain, get short of breath, or tired during the exercise, please stop immediately and inform your doctor.

## 2017-06-02 LAB — MICROALBUMIN / CREATININE URINE RATIO
CREATININE, URINE: 198 mg/dL (ref 20–320)
MICROALB UR: 1.2 mg/dL
Microalb Creat Ratio: 6 mcg/mg creat (ref <30)

## 2017-06-02 LAB — URINALYSIS, MICROSCOPIC ONLY
Bacteria, UA: NONE SEEN [HPF]
CASTS: NONE SEEN [LPF]
CRYSTALS: NONE SEEN [HPF]
RBC / HPF: NONE SEEN RBC/HPF (ref <=2)
SQUAMOUS EPITHELIAL / LPF: NONE SEEN [HPF] (ref <=5)
WBC, UA: NONE SEEN WBC/HPF (ref <=5)
YEAST: NONE SEEN [HPF]

## 2017-06-02 LAB — URINALYSIS, ROUTINE W REFLEX MICROSCOPIC
BILIRUBIN URINE: NEGATIVE
Glucose, UA: NEGATIVE
KETONES UR: NEGATIVE
Leukocytes, UA: NEGATIVE
NITRITE: NEGATIVE
PH: 6 (ref 5.0–8.0)
Protein, ur: NEGATIVE
Specific Gravity, Urine: 1.022 (ref 1.001–1.035)

## 2017-06-02 LAB — VITAMIN D 25 HYDROXY (VIT D DEFICIENCY, FRACTURES): Vit D, 25-Hydroxy: 17 ng/mL — ABNORMAL LOW (ref 30–100)

## 2017-06-02 LAB — HEMOGLOBIN A1C
HEMOGLOBIN A1C: 5.8 % — AB (ref <5.7)
MEAN PLASMA GLUCOSE: 120 mg/dL

## 2017-06-02 LAB — MAGNESIUM: MAGNESIUM: 2 mg/dL (ref 1.5–2.5)

## 2017-06-09 ENCOUNTER — Ambulatory Visit
Admission: RE | Admit: 2017-06-09 | Discharge: 2017-06-09 | Disposition: A | Discharge status: Home / Self Care | Payer: 59 | Source: Ambulatory Visit | Attending: Physician Assistant | Admitting: Physician Assistant

## 2017-06-09 DIAGNOSIS — R1909 Other intra-abdominal and pelvic swelling, mass and lump: Secondary | ICD-10-CM

## 2017-08-05 LAB — HM PAP SMEAR: HM Pap smear: NORMAL

## 2017-08-20 LAB — HM MAMMOGRAPHY: HM MAMMO: NORMAL (ref 0–4)

## 2018-02-09 ENCOUNTER — Encounter (INDEPENDENT_AMBULATORY_CARE_PROVIDER_SITE_OTHER): Payer: Self-pay

## 2018-03-03 ENCOUNTER — Ambulatory Visit (INDEPENDENT_AMBULATORY_CARE_PROVIDER_SITE_OTHER): Payer: Managed Care, Other (non HMO) | Admitting: Family Medicine

## 2018-03-03 ENCOUNTER — Encounter (INDEPENDENT_AMBULATORY_CARE_PROVIDER_SITE_OTHER): Payer: Self-pay | Admitting: Family Medicine

## 2018-03-03 VITALS — Temp 97.9°F | Ht 69.0 in | Wt 226.0 lb

## 2018-03-03 DIAGNOSIS — R7303 Prediabetes: Secondary | ICD-10-CM | POA: Diagnosis not present

## 2018-03-03 DIAGNOSIS — Z9189 Other specified personal risk factors, not elsewhere classified: Secondary | ICD-10-CM

## 2018-03-03 DIAGNOSIS — E559 Vitamin D deficiency, unspecified: Secondary | ICD-10-CM | POA: Diagnosis not present

## 2018-03-03 DIAGNOSIS — Z1331 Encounter for screening for depression: Secondary | ICD-10-CM

## 2018-03-03 DIAGNOSIS — Z6833 Body mass index (BMI) 33.0-33.9, adult: Secondary | ICD-10-CM | POA: Diagnosis not present

## 2018-03-03 DIAGNOSIS — E669 Obesity, unspecified: Secondary | ICD-10-CM | POA: Diagnosis not present

## 2018-03-03 DIAGNOSIS — R0602 Shortness of breath: Secondary | ICD-10-CM | POA: Diagnosis not present

## 2018-03-03 DIAGNOSIS — R5383 Other fatigue: Secondary | ICD-10-CM | POA: Diagnosis not present

## 2018-03-03 DIAGNOSIS — Z0289 Encounter for other administrative examinations: Secondary | ICD-10-CM

## 2018-03-04 LAB — LIPID PANEL WITH LDL/HDL RATIO
Cholesterol, Total: 187 mg/dL (ref 100–199)
HDL: 53 mg/dL (ref >39)
LDL CALC: 122 mg/dL — AB (ref 0–99)
LDL/HDL RATIO: 2.3 ratio (ref 0.0–3.2)
TRIGLYCERIDES: 61 mg/dL (ref 0–149)
VLDL Cholesterol Cal: 12 mg/dL (ref 5–40)

## 2018-03-04 LAB — CBC WITH DIFFERENTIAL
BASOS: 0 %
Basophils Absolute: 0 10*3/uL (ref 0.0–0.2)
EOS (ABSOLUTE): 0.1 10*3/uL (ref 0.0–0.4)
EOS: 1 %
HEMATOCRIT: 46.2 % (ref 34.0–46.6)
HEMOGLOBIN: 15.6 g/dL (ref 11.1–15.9)
IMMATURE GRANS (ABS): 0 10*3/uL (ref 0.0–0.1)
IMMATURE GRANULOCYTES: 0 %
LYMPHS: 34 %
Lymphocytes Absolute: 2.4 10*3/uL (ref 0.7–3.1)
MCH: 28.8 pg (ref 26.6–33.0)
MCHC: 33.8 g/dL (ref 31.5–35.7)
MCV: 85 fL (ref 79–97)
MONOCYTES: 5 %
Monocytes Absolute: 0.3 10*3/uL (ref 0.1–0.9)
Neutrophils Absolute: 4.2 10*3/uL (ref 1.4–7.0)
Neutrophils: 60 %
RBC: 5.41 x10E6/uL — AB (ref 3.77–5.28)
RDW: 14.4 % (ref 12.3–15.4)
WBC: 7.1 10*3/uL (ref 3.4–10.8)

## 2018-03-04 LAB — COMPREHENSIVE METABOLIC PANEL
A/G RATIO: 1.1 — AB (ref 1.2–2.2)
ALT: 8 IU/L (ref 0–32)
AST: 11 IU/L (ref 0–40)
Albumin: 3.9 g/dL (ref 3.5–5.5)
Alkaline Phosphatase: 54 IU/L (ref 39–117)
BUN/Creatinine Ratio: 12 (ref 9–23)
BUN: 10 mg/dL (ref 6–24)
Bilirubin Total: 0.5 mg/dL (ref 0.0–1.2)
CALCIUM: 9.2 mg/dL (ref 8.7–10.2)
CO2: 23 mmol/L (ref 20–29)
CREATININE: 0.81 mg/dL (ref 0.57–1.00)
Chloride: 104 mmol/L (ref 96–106)
GFR, EST AFRICAN AMERICAN: 99 mL/min/{1.73_m2} (ref >59)
GFR, EST NON AFRICAN AMERICAN: 86 mL/min/{1.73_m2} (ref >59)
GLOBULIN, TOTAL: 3.5 g/dL (ref 1.5–4.5)
Glucose: 94 mg/dL (ref 65–99)
POTASSIUM: 4.6 mmol/L (ref 3.5–5.2)
SODIUM: 138 mmol/L (ref 134–144)
TOTAL PROTEIN: 7.4 g/dL (ref 6.0–8.5)

## 2018-03-04 LAB — INSULIN, RANDOM: INSULIN: 10.7 u[IU]/mL (ref 2.6–24.9)

## 2018-03-04 LAB — FOLATE: Folate: 15.6 ng/mL

## 2018-03-04 LAB — TSH: TSH: 1.54 u[IU]/mL (ref 0.450–4.500)

## 2018-03-04 LAB — T4, FREE: Free T4: 1.17 ng/dL (ref 0.82–1.77)

## 2018-03-04 LAB — HEMOGLOBIN A1C
Est. average glucose Bld gHb Est-mCnc: 126 mg/dL
HEMOGLOBIN A1C: 6 % — AB (ref 4.8–5.6)

## 2018-03-04 LAB — VITAMIN B12: Vitamin B-12: 203 pg/mL — ABNORMAL LOW (ref 232–1245)

## 2018-03-04 LAB — VITAMIN D 25 HYDROXY (VIT D DEFICIENCY, FRACTURES): Vit D, 25-Hydroxy: 13.8 ng/mL — ABNORMAL LOW (ref 30.0–100.0)

## 2018-03-04 LAB — T3: T3, Total: 173 ng/dL (ref 71–180)

## 2018-03-04 NOTE — Progress Notes (Signed)
.  Office: [phone]  /  Fax: [phone]   HPI:   Chief Complaint: OBESITY  Sarah Warren (MR# [id number]) is a 47 y.o. female who presents on 03/04/2018 for obesity evaluation and treatment. Current BMI is Body mass index is 33.37 kg/m.Marland Kitchen Sarah Warren has struggled with obesity for years and has been unsuccessful in either losing weight or maintaining long term weight loss. Sarah Warren attended our information session and states she is currently in the action stage of change and ready to dedicate time achieving and maintaining a healthier weight.  Sarah Warren states she thinks her family will eat healthier with  her her desired weight loss is 50 lbs she has been heavy most of  her life she started gaining weight after college her heaviest weight ever was 235 lbs. she skips meals frequently she is frequently drinking liquids with calories she frequently makes poor food choices she frequently eats larger portions than normal  she has binge eating behaviors she struggles with emotional eating    Fatigue Sarah Warren feels her energy is lower than it should be. This has worsened with weight gain and has not worsened recently. Sarah Warren admits to daytime somnolence and denies waking up still tired. Patient is at risk for obstructive sleep apnea. Patent has a history of symptoms of daytime fatigue and morning headache. Patient generally gets 7 or 8 hours of sleep per night, and states they generally have restful sleep. Snoring is present. Apneic episodes are not present. Epworth Sleepiness Score is 10  Dyspnea on exertion Sarah Warren notes increasing shortness of breath with exercising and seems to be worsening over time with weight gain. She notes getting out of breath sooner with activity than she used to. This has not gotten worse recently. Sarah Warren denies orthopnea.  Vitamin D deficiency Sarah Warren has a diagnosis of vitamin D deficiency. There are no recent labs. She is currently taking OTC vit D and admits fatigue  but  denies nausea, vomiting or muscle weakness.  Pre-Diabetes Sarah Warren has a diagnosis of pre-diabetes based on her elevated Hgb A1c and was informed this puts her at greater risk of developing diabetes. Sarah Warren is not taking metformin currently and she would like to control with diet. Sarah Warren is attempting to work on diet and exercise to decrease risk of diabetes. She admits polyphagia and denies nausea or hypoglycemia.  At risk for diabetes Sarah Warren is at higher than average risk for developing diabetes due to her obesity and pre-diabetes. She currently denies polyuria or polydipsia.  Depression Screen Sarah Warren's Food and Mood (modified PHQ-9) score was  Depression screen PHQ 2/9 03/03/2018  Decreased Interest 0  Down, Depressed, Hopeless 0  PHQ - 2 Score 0  Altered sleeping 0  Tired, decreased energy 0  Change in appetite 1  Feeling bad or failure about yourself  0  Trouble concentrating 1  Moving slowly or fidgety/restless 0  Suicidal thoughts 0  PHQ-9 Score 2  Difficult doing work/chores Not difficult at all    ALLERGIES: No Known Allergies  MEDICATIONS: Current Outpatient Medications on File Prior to Visit  Medication Sig Dispense Refill  . cholecalciferol (VITAMIN D) 1000 units tablet Take 1,000 Units by mouth daily.    Marland Kitchen GILDESS 1/20 1-20 MG-MCG tablet TAKE ONE TABLET BY MOUTH ONE TIME DAILY. NO REFILLS UNTIL OFFICE VISIT.  0  . Multiple Vitamin (MULTIVITAMIN) tablet Take 1 tablet by mouth daily.     No current facility-administered medications on file prior to visit.     PAST MEDICAL HISTORY: Past  Medical History:  Diagnosis Date  . Ankle swelling   . Asthma    childhood  . Glaucoma   . Uterine fibroid     PAST SURGICAL HISTORY: Past Surgical History:  Procedure Laterality Date  . WISDOM TOOTH EXTRACTION      SOCIAL HISTORY: Social History   Tobacco Use  . Smoking status: Never Smoker  . Smokeless tobacco: Never Used  Substance Use Topics  . Alcohol use:  Yes    Comment: occasional wine  . Drug use: No    FAMILY HISTORY: Family History  Problem Relation Age of Onset  . Hypertension Mother   . Diabetes Mother   . Hypertension Father   . Diabetes Father   . Diabetes Maternal Grandmother     ROS: Review of Systems  Constitutional: Positive for malaise/fatigue.  Eyes:       Wear Glasses or Contacts  Respiratory: Positive for shortness of breath (on exertion).   Gastrointestinal: Negative for nausea and vomiting.  Genitourinary: Negative for dysuria.  Musculoskeletal:       Negative for muscle weakness  Endo/Heme/Allergies: Negative for polydipsia.       Positive for polyphagia Negative for hypoglycemia    PHYSICAL EXAM: Temperature 97.9 F (36.6 C), temperature source Oral, height 5\' 9"  (1.753 m), weight 226 lb (102.5 kg), last menstrual period 02/09/2018. Body mass index is 33.37 kg/m. Physical Exam  Constitutional: She is oriented to person, place, and time. She appears well-developed and well-nourished.  HENT:  Head: Normocephalic and atraumatic.  Nose: Nose normal.  Eyes: EOM are normal. No scleral icterus.  Neck: Normal range of motion. Neck supple. No thyromegaly present.  Cardiovascular: Normal rate and regular rhythm.  Pulmonary/Chest: Effort normal. No respiratory distress.  Abdominal: Soft. There is no tenderness.  + obesity  Musculoskeletal: Normal range of motion.  Range of Motion normal in all 4 extremities  Neurological: She is alert and oriented to person, place, and time. Coordination normal.  Skin: Skin is warm and dry.  Psychiatric: She has a normal mood and affect. Her behavior is normal.  Vitals reviewed.   RECENT LABS AND TESTS: BMET    Component Value Date/Time   NA 138 03/03/2018 1015   K 4.6 03/03/2018 1015   CL 104 03/03/2018 1015   CO2 23 03/03/2018 1015   GLUCOSE 94 03/03/2018 1015   GLUCOSE 91 06/01/2017 0957   BUN 10 03/03/2018 1015   CREATININE 0.81 03/03/2018 1015    CREATININE 0.81 06/01/2017 0957   CALCIUM 9.2 03/03/2018 1015   GFRNONAA 86 03/03/2018 1015   GFRNONAA 87 06/01/2017 0957   GFRAA 99 03/03/2018 1015   GFRAA >89 06/01/2017 0957   Lab Results  Component Value Date   HGBA1C 6.0 (H) 03/03/2018   Lab Results  Component Value Date   INSULIN 10.7 03/03/2018   CBC    Component Value Date/Time   WBC 7.1 03/03/2018 1015   WBC 7.3 06/01/2017 0957   RBC 5.41 (H) 03/03/2018 1015   RBC 5.38 (H) 06/01/2017 0957   HGB 15.6 03/03/2018 1015   HCT 46.2 03/03/2018 1015   PLT 306 06/01/2017 0957   MCV 85 03/03/2018 1015   MCH 28.8 03/03/2018 1015   MCH 28.3 06/01/2017 0957   MCHC 33.8 03/03/2018 1015   MCHC 33.0 06/01/2017 0957   RDW 14.4 03/03/2018 1015   LYMPHSABS 2.4 03/03/2018 1015   MONOABS 438 06/01/2017 0957   EOSABS 0.1 03/03/2018 1015   BASOSABS 0.0 03/03/2018 1015  Iron/TIBC/Ferritin/ %Sat    Component Value Date/Time   IRON 107 05/16/2015 1057   TIBC 295 05/16/2015 1057   FERRITIN 51 05/16/2015 1057   IRONPCTSAT 36 05/16/2015 1057   Lipid Panel     Component Value Date/Time   CHOL 187 03/03/2018 1015   TRIG 61 03/03/2018 1015   HDL 53 03/03/2018 1015   CHOLHDL 3.2 06/01/2017 0957   VLDL 14 06/01/2017 0957   LDLCALC 122 (H) 03/03/2018 1015   Hepatic Function Panel     Component Value Date/Time   PROT 7.4 03/03/2018 1015   ALBUMIN 3.9 03/03/2018 1015   AST 11 03/03/2018 1015   ALT 8 03/03/2018 1015   ALKPHOS 54 03/03/2018 1015   BILITOT 0.5 03/03/2018 1015   BILIDIR 0.1 06/01/2017 0957   IBILI 0.3 06/01/2017 0957      Component Value Date/Time   TSH 1.540 03/03/2018 1015   Vitamin D Results for GLENDIA, [id number] (MRN [id number]) as of 03/04/2018 11:55  Ref. Range 06/01/2017 09:57  Vitamin D, 25-Hydroxy Latest Ref Range: 30 - 100 ng/mL 17 (L)    ECG  shows NSR with a rate of 64 BPM INDIRECT CALORIMETER done today shows a VO2 of 212 and a REE of 1473. Her calculated basal metabolic rate is 5277 thus her  basal metabolic rate is worse than expected.    ASSESSMENT AND PLAN: Other fatigue - Plan: EKG 12-Lead, Vitamin B12, CBC With Differential, Folate, Lipid Panel With LDL/HDL Ratio, T3, T4, free, TSH  Shortness of breath on exertion - Plan: CBC With Differential  Prediabetes - Plan: Comprehensive metabolic panel, Hemoglobin A1c, Insulin, random  Vitamin D deficiency - Plan: VITAMIN D 25 Hydroxy (Vit-D Deficiency, Fractures)  Depression screening  At risk for diabetes mellitus  Class 1 obesity with serious comorbidity and body mass index (BMI) of 33.0 to 33.9 in adult, unspecified obesity type  PLAN:  Fatigue Cody was informed that her fatigue may be related to obesity, depression or many other causes. Labs will be ordered, and in the meanwhile Terecia has agreed to work on diet, exercise and weight loss to help with fatigue. Proper sleep hygiene was discussed including the need for 7-8 hours of quality sleep each night. A sleep study was not ordered based on symptoms and Epworth score.  Dyspnea on exertion Dorothye's shortness of breath appears to be obesity related and exercise induced. She has agreed to work on weight loss and gradually increase exercise to treat her exercise induced shortness of breath. If Roben follows our instructions and loses weight without improvement of her shortness of breath, we will plan to refer to pulmonology. We will monitor this condition regularly. Keyonni agrees to this plan.  Vitamin D Deficiency Emnet was informed that low vitamin D levels contributes to fatigue and are associated with obesity, breast, and colon cancer. She agrees to continue to take OTC vitamin D and we will check labs. She will follow up for routine testing of vitamin D, at least 2-3 times per year. She was informed of the risk of over-replacement of vitamin D and agrees to not increase her dose unless she discusses this with [place] first. Tarah agrees to follow up with our clinic in 2  weeks.  Pre-Diabetes Sevana will continue to work on weight loss, exercise, and decreasing simple carbohydrates in her diet to help decrease the risk of diabetes.  She was informed that eating too many simple carbohydrates or too many calories at one sitting increases the likelihood of GI  side effects. We will check labs and Margarie agreed to follow up with [place] as directed to monitor her progress.  Diabetes risk counseling Sharai was given extended (15 minutes) diabetes prevention counseling today. She is 47 y.o. female and has risk factors for diabetes including obesity and pre-diabetes. We discussed intensive lifestyle modifications today with an emphasis on weight loss as well as increasing exercise and decreasing simple carbohydrates in her diet.  Depression Screen Aryam had a negative depression screening. Depression is commonly associated with obesity and often results in emotional eating behaviors. We will monitor this closely and work on CBT to help improve the non-hunger eating patterns. Referral to Psychology may be required if no improvement is seen as she continues in our clinic.  Obesity Brekyn is currently in the action stage of change and her goal is to continue with weight loss efforts She has agreed to follow the Category 2 plan Cheetara has been instructed to work up to a goal of 150 minutes of combined cardio and strengthening exercise per week for weight loss and overall health benefits. We discussed the following Behavioral Modification Strategies today: increasing lean protein intake, decreasing simple carbohydrates  and work on meal planning and easy cooking plans  Shulamis has agreed to follow up with our clinic in 2 weeks. She was informed of the importance of frequent follow up visits to maximize her success with intensive lifestyle modifications for her multiple health conditions. She was informed we would discuss her lab results at her next visit unless there is a critical  issue that needs to be addressed sooner. Zhaniya agreed to keep her next visit at the agreed upon time to discuss these results.    OBESITY BEHAVIORAL INTERVENTION VISIT  Today's visit was # 1 out of 22.  Starting weight: 226 lbs Starting date: 03/03/18 Today's weight : 226 lbs  Today's date: 03/03/2018 Total lbs lost to date: 0 (Patients must lose 7 lbs in the first 6 months to continue with counseling)   ASK: We discussed the diagnosis of obesity with De Hollingshead today and Sabah agreed to give [place] permission to discuss obesity behavioral modification therapy today.  ASSESS: Raechel has the diagnosis of obesity and her BMI today is 33.36 Tajia is in the action stage of change   ADVISE: Harriet was educated on the multiple health risks of obesity as well as the benefit of weight loss to improve her health. She was advised of the need for long term treatment and the importance of lifestyle modifications.  AGREE: Multiple dietary modification options and treatment options were discussed and  Karley agreed to the above obesity treatment plan.   I, Doreene Nest, am acting as transcriptionist for Dennard Nip, MD  I have reviewed the above documentation for accuracy and completeness, and I agree with the above. -Dennard Nip, MD

## 2018-03-18 ENCOUNTER — Ambulatory Visit (INDEPENDENT_AMBULATORY_CARE_PROVIDER_SITE_OTHER): Payer: Managed Care, Other (non HMO) | Admitting: Family Medicine

## 2018-03-18 VITALS — BP 154/89 | HR 57 | Temp 98.1°F | Ht 69.0 in | Wt 220.0 lb

## 2018-03-18 DIAGNOSIS — Z9189 Other specified personal risk factors, not elsewhere classified: Secondary | ICD-10-CM | POA: Diagnosis not present

## 2018-03-18 DIAGNOSIS — E669 Obesity, unspecified: Secondary | ICD-10-CM

## 2018-03-18 DIAGNOSIS — Z6832 Body mass index (BMI) 32.0-32.9, adult: Secondary | ICD-10-CM | POA: Diagnosis not present

## 2018-03-18 DIAGNOSIS — E559 Vitamin D deficiency, unspecified: Secondary | ICD-10-CM

## 2018-03-18 DIAGNOSIS — R7303 Prediabetes: Secondary | ICD-10-CM | POA: Diagnosis not present

## 2018-03-18 MED ORDER — VITAMIN D (ERGOCALCIFEROL) 1.25 MG (50000 UNIT) PO CAPS: 50000.0000 [IU] | ORAL_CAPSULE | ORAL | 0 refills | Status: DC

## 2018-03-18 MED ORDER — METFORMIN HCL 500 MG PO TABS: 500.0000 mg | ORAL_TABLET | Freq: Every day | ORAL | 0 refills | Status: DC

## 2018-03-21 NOTE — Progress Notes (Signed)
Complete Physical  Assessment and Plan:  Prediabetes Discussed disease progression and risks Discussed diet/exercise, weight management and risk modification  Obesity Obesity with co morbidities- long discussion about weight loss, diet, and exercise - GOT LABS AT [facility], WENT OVER THESE WITH PATIENT  Hyperlipidemia LDL goal <130 -continue medications, check lipids, decrease fatty foods, increase activity.    Vitamin D deficiency - may need more than 50,000 a month to reach goal   Asthma, unspecified asthma severity, uncomplicated controlled   Routine general medical examination at a health care facility 1 year  Benign labile hypertension - - continue medications, DASH diet, exercise and monitor at home. .  - monitor at home, continue weight loss, Call if greater than 130/80  B12 def Can wait to get rechecked but at this time B12 is low enough that I suggest starting sublingual B!2 500mg  daily especially with addition of metformin  Discussed med's effects and SE's. Screening labs and tests as requested with regular follow-up as recommended. Over 40 minutes of exam, counseling, chart review and critical decision making was performed  HPI  This very nice 47 y.o. AA female presents for complete physical.   She does workout, going to start weight lifting classes, denies CP/SOB. BP: 132/90  She has a history of childhood asthma that is well controlled, SHE DENIES ANY SOB OR DYSPNEA WITH EXERTION.  Finally, patient has history of Vitamin D Deficiency, started on 50,000 IU once a month and last vitamin D was  Lab Results  Component Value Date   VD25OH 13.8 (L) 03/03/2018   She has a 27 year old son, 11th grade PT or [employer].  BMI is Body mass index is 32.74 kg/m., she is working on diet and exercise. [facility], we discussed that she needs to base her weight off one scale and to not compare scales. She does just water, she eats 3  meals a day, she has stopped eating out as often, she is not doing a meal tracker. She eats 2 boiled eggs, a slice of toast and chicken sausage, lunch is sandwich, and dinner is  Wt Readings from Last 3 Encounters:  03/23/18 228 lb 3.2 oz (103.5 kg)  03/18/18 220 lb (99.8 kg)  03/03/18 226 lb (102.5 kg)  Due to her obesity she has prediabetes that is being monitored. She denies diabetic polys.  Lab Results  Component Value Date   HGBA1C 6.0 (H) 03/03/2018  She has hyperlipidemia but is not on medication. Her last cholesterol was Lab Results  Component Value Date   CHOL 187 03/03/2018   HDL 53 03/03/2018   LDLCALC 122 (H) 03/03/2018   TRIG 61 03/03/2018   CHOLHDL 3.2 06/01/2017     Current Medications:  Current Outpatient Medications on File Prior to Visit  Medication Sig Dispense Refill  . cholecalciferol (VITAMIN D) 1000 units tablet Take 1,000 Units by mouth daily.    Marland Kitchen GILDESS 1/20 1-20 MG-MCG tablet TAKE ONE TABLET BY MOUTH ONE TIME DAILY. NO REFILLS UNTIL OFFICE VISIT.  0  . metFORMIN (GLUCOPHAGE) 500 MG tablet Take 1 tablet (500 mg total) by mouth daily with breakfast. 30 tablet 0  . Multiple Vitamin (MULTIVITAMIN) tablet Take 1 tablet by mouth daily.    . Vitamin D, Ergocalciferol, (DRISDOL) 50000 units CAPS capsule Take 1 capsule (50,000 Units total) by mouth every 7 (seven) days. 4 capsule 0   No current facility-administered medications on file prior to visit.  Health Maintenance:   Immunization History  Administered Date(s) Administered  . Influenza-Unspecified 09/13/2015  . PPD Test 04/25/2014  . Tdap 04/25/2014, 09/13/2015   Works at [employer], she is in the [employer]. Worked there 10 years, works 8-5   TD/TDAP: 2015 Influenza: did not get this year PPD 2015 Pneumovax: N/A Prevnar 13: N/A  LMP: Patient's last menstrual period was 03/11/2018. Pap: 2018- Dr. Raphael Gibney with [facility], normal pap MGM: 08/20/2017 at work, mobile comes  around Last Dental Exam:  q 6 months  Last Eye Exam: Dr. Len Childs, 2017, glasses  Medical History:  Past Medical History:  Diagnosis Date  . Ankle swelling   . Asthma    childhood  . Glaucoma   . Uterine fibroid    Allergies No Known Allergies  SURGICAL HISTORY She  has a past surgical history that includes Wisdom tooth extraction. FAMILY HISTORY Her family history includes Diabetes in her father, maternal grandmother, and mother; Hypertension in her father and mother. SOCIAL HISTORY She  reports that she has never smoked. She has never used smokeless tobacco. She reports that she drinks alcohol. She reports that she does not use drugs.  Review of Systems: Review of Systems  Constitutional: Negative.   HENT: Negative.   Eyes: Negative.   Respiratory: Negative.   Cardiovascular: Negative.   Gastrointestinal: Negative.   Genitourinary: Negative.   Musculoskeletal: Negative.   Skin: Negative for itching and rash.  Neurological: Negative.   Endo/Heme/Allergies: Negative.   Psychiatric/Behavioral: Negative.     Physical Exam: Estimated body mass index is 32.74 kg/m as calculated from the following:   Height as of this encounter: 5\' 10"  (1.778 m).   Weight as of this encounter: 228 lb 3.2 oz (103.5 kg). BP 132/90   Pulse 70   Temp 97.9 F (36.6 C)   Ht 5\' 10"  (1.778 m)   Wt 228 lb 3.2 oz (103.5 kg)   LMP 03/11/2018   SpO2 98%   BMI 32.74 kg/m  General Appearance: Well nourished, in no apparent distress.  Eyes: PERRLA, EOMs, conjunctiva no swelling or erythema, normal fundi and vessels.  Sinuses: No Frontal/maxillary tenderness  ENT/Mouth: Ext aud canals clear, normal light reflex with TMs without erythema, bulging. Good dentition. No erythema, swelling, or exudate on post pharynx. Tonsils not swollen or erythematous. Hearing normal.  Neck: Supple, thyroid normal. No bruits  Respiratory: Respiratory effort normal, BS equal bilaterally without rales, rhonchi, wheezing  or stridor.  Cardio: RRR without murmurs, rubs or gallops. Brisk peripheral pulses without edema.  Chest: symmetric, with normal excursions and percussion.  Breasts: defer Abdomen: Soft, nontender, + fixed enlarged uterus, nontender, rebound, hernias, masses, or organomegaly.  Lymphatics: Non tender without lymphadenopathy.  Genitourinary: defer Musculoskeletal: Full ROM all peripheral extremities,5/5 strength, and normal gait.  Skin: Acne on nose. Warm, dry without rashes, lesions, ecchymosis. Neuro: Cranial nerves intact, reflexes equal bilaterally. Normal muscle tone, no cerebellar symptoms. Sensation intact.  Psych: Awake and oriented X 3, normal affect, Insight and Judgment appropriate.   EKG: defer  Vicie Mutters 9:08 AM [facility]

## 2018-03-22 NOTE — Progress Notes (Signed)
Office: [phone]  /  Fax: [phone]   HPI:   Chief Complaint: OBESITY Sarah Warren is here to discuss her progress with her obesity treatment plan. She is on the Category 2 plan and is following her eating plan approximately 60 % of the time. She states she is exercising 0 minutes 0 times per week. Keiran did well with weight loss on the category 2 plan, but she had a difficult time eating all of her dinner. She also noted increased mid morning hunger by the second week. Her weight is 220 lb (99.8 kg) today and has had a weight loss of 6 pounds over a period of 2 weeks since her last visit. She has lost 6 lbs since starting treatment with [place].  Vitamin D deficiency Meredeth has a new diagnosis of vitamin D deficiency. She is not currently taking vit D and admits fatigue but denies nausea, vomiting or muscle weakness.  Pre-Diabetes Makaylie has a diagnosis of pre-diabetes based on her slightly worse Hgb A1c and fasting insulin >5 and she was informed this puts her at greater risk of developing diabetes. She is not taking metformin currently and continues to work on diet and exercise to decrease risk of diabetes. She denies nausea or hypoglycemia.  At risk for diabetes Julieanna is at higher than average risk for developing diabetes due to her obesity and pre-diabetes. She currently denies polyuria or polydipsia.  ALLERGIES: No Known Allergies  MEDICATIONS: Current Outpatient Medications on File Prior to Visit  Medication Sig Dispense Refill  . cholecalciferol (VITAMIN D) 1000 units tablet Take 1,000 Units by mouth daily.    Marland Kitchen GILDESS 1/20 1-20 MG-MCG tablet TAKE ONE TABLET BY MOUTH ONE TIME DAILY. NO REFILLS UNTIL OFFICE VISIT.  0  . Multiple Vitamin (MULTIVITAMIN) tablet Take 1 tablet by mouth daily.     No current facility-administered medications on file prior to visit.     PAST MEDICAL HISTORY: Past Medical History:  Diagnosis Date  . Ankle swelling   . Asthma    childhood  .  Glaucoma   . Uterine fibroid     PAST SURGICAL HISTORY: Past Surgical History:  Procedure Laterality Date  . WISDOM TOOTH EXTRACTION      SOCIAL HISTORY: Social History   Tobacco Use  . Smoking status: Never Smoker  . Smokeless tobacco: Never Used  Substance Use Topics  . Alcohol use: Yes    Comment: occasional wine  . Drug use: No    FAMILY HISTORY: Family History  Problem Relation Age of Onset  . Hypertension Mother   . Diabetes Mother   . Hypertension Father   . Diabetes Father   . Diabetes Maternal Grandmother     ROS: Review of Systems  Constitutional: Positive for malaise/fatigue and weight loss.  Gastrointestinal: Negative for nausea and vomiting.  Genitourinary: Negative for frequency.  Musculoskeletal:       Negative for muscle weakness  Endo/Heme/Allergies: Negative for polydipsia.    PHYSICAL EXAM: Blood pressure (!) 154/89, pulse (!) 57, temperature 98.1 F (36.7 C), height 5\' 9"  (1.753 m), weight 220 lb (99.8 kg), SpO2 99 %. Body mass index is 32.49 kg/m. Physical Exam  Constitutional: She is oriented to person, place, and time. She appears well-developed and well-nourished.  Cardiovascular: Normal rate.  Pulmonary/Chest: Effort normal.  Musculoskeletal: Normal range of motion.  Neurological: She is oriented to person, place, and time.  Skin: Skin is warm and dry.  Psychiatric: She has a normal mood and affect. Her behavior  is normal.  Vitals reviewed.   RECENT LABS AND TESTS: BMET    Component Value Date/Time   NA 138 03/03/2018 1015   K 4.6 03/03/2018 1015   CL 104 03/03/2018 1015   CO2 23 03/03/2018 1015   GLUCOSE 94 03/03/2018 1015   GLUCOSE 91 06/01/2017 0957   BUN 10 03/03/2018 1015   CREATININE 0.81 03/03/2018 1015   CREATININE 0.81 06/01/2017 0957   CALCIUM 9.2 03/03/2018 1015   GFRNONAA 86 03/03/2018 1015   GFRNONAA 87 06/01/2017 0957   GFRAA 99 03/03/2018 1015   GFRAA >89 06/01/2017 0957   Lab Results  Component  Value Date   HGBA1C 6.0 (H) 03/03/2018   HGBA1C 5.8 (H) 06/01/2017   HGBA1C 5.9 (H) 05/28/2016   HGBA1C 5.9 (H) 05/16/2015   HGBA1C 5.9 (H) 12/28/2014   Lab Results  Component Value Date   INSULIN 10.7 03/03/2018   CBC    Component Value Date/Time   WBC 7.1 03/03/2018 1015   WBC 7.3 06/01/2017 0957   RBC 5.41 (H) 03/03/2018 1015   RBC 5.38 (H) 06/01/2017 0957   HGB 15.6 03/03/2018 1015   HCT 46.2 03/03/2018 1015   PLT 306 06/01/2017 0957   MCV 85 03/03/2018 1015   MCH 28.8 03/03/2018 1015   MCH 28.3 06/01/2017 0957   MCHC 33.8 03/03/2018 1015   MCHC 33.0 06/01/2017 0957   RDW 14.4 03/03/2018 1015   LYMPHSABS 2.4 03/03/2018 1015   MONOABS 438 06/01/2017 0957   EOSABS 0.1 03/03/2018 1015   BASOSABS 0.0 03/03/2018 1015   Iron/TIBC/Ferritin/ %Sat    Component Value Date/Time   IRON 107 05/16/2015 1057   TIBC 295 05/16/2015 1057   FERRITIN 51 05/16/2015 1057   IRONPCTSAT 36 05/16/2015 1057   Lipid Panel     Component Value Date/Time   CHOL 187 03/03/2018 1015   TRIG 61 03/03/2018 1015   HDL 53 03/03/2018 1015   CHOLHDL 3.2 06/01/2017 0957   VLDL 14 06/01/2017 0957   LDLCALC 122 (H) 03/03/2018 1015   Hepatic Function Panel     Component Value Date/Time   PROT 7.4 03/03/2018 1015   ALBUMIN 3.9 03/03/2018 1015   AST 11 03/03/2018 1015   ALT 8 03/03/2018 1015   ALKPHOS 54 03/03/2018 1015   BILITOT 0.5 03/03/2018 1015   BILIDIR 0.1 06/01/2017 0957   IBILI 0.3 06/01/2017 0957      Component Value Date/Time   TSH 1.540 03/03/2018 1015   TSH 1.51 06/01/2017 0957   TSH 1.78 05/28/2016 0954   Results for AIDE, WOJNAR (MRN [id number]) as of 03/22/2018 18:04  Ref. Range 03/03/2018 10:15  Vitamin D, 25-Hydroxy Latest Ref Range: 30.0 - 100.0 ng/mL 13.8 (L)   ASSESSMENT AND PLAN: Vitamin D deficiency - Plan: Vitamin D, Ergocalciferol, (DRISDOL) 50000 units CAPS capsule  Prediabetes - Plan: metFORMIN (GLUCOPHAGE) 500 MG tablet  At risk for diabetes  mellitus  Class 1 obesity with serious comorbidity and body mass index (BMI) of 32.0 to 32.9 in adult, unspecified obesity type  PLAN:  Vitamin D Deficiency Lyrik was informed that low vitamin D levels contributes to fatigue and are associated with obesity, breast, and colon cancer. She agrees to start to take prescription Vit D @50 ,000 IU every week and will follow up for routine testing of vitamin D, at least 2-3 times per year. She was informed of the risk of over-replacement of vitamin D and agrees to not increase her dose unless she discusses this with [place] first.  Keina agrees to follow up with our clinic in 2 to 3 weeks.  Pre-Diabetes Jonna will continue to work on weight loss, exercise, and decreasing simple carbohydrates in her diet to help decrease the risk of diabetes. We dicussed metformin including benefits and risks. She was informed that eating too many simple carbohydrates or too many calories at one sitting increases the likelihood of GI side effects. Zelphia agreed to start metformin  500 mg qAM #30 with no refills and follow up with [place] as directed to monitor her progress.  Diabetes risk counseling Shandrell was given extended (30 minutes) diabetes prevention counseling today. She is 47 y.o. female and has risk factors for diabetes including obesity and pre-diabetes. We discussed intensive lifestyle modifications today with an emphasis on weight loss as well as increasing exercise and decreasing simple carbohydrates in her diet.  Obesity Analei is currently in the action stage of change. As such, her goal is to continue with weight loss efforts She has agreed to follow the Category 2 plan  Emanuel is allowed to increase breakfast protein and decrease dinner protein to fit her better. Lorella has been instructed to work up to a goal of 150 minutes of combined cardio and strengthening exercise per week for weight loss and overall health benefits. We discussed the following Behavioral  Modification Strategies today: better snacking choices, increasing lean protein intake and decreasing simple carbohydrates   Nolan has agreed to follow up with our clinic in 2 to 3 weeks. She was informed of the importance of frequent follow up visits to maximize her success with intensive lifestyle modifications for her multiple health conditions.   OBESITY BEHAVIORAL INTERVENTION VISIT  Today's visit was # 2 out of 22.  Starting weight: 226 lbs Starting date: 03/03/18 Today's weight : 220 lbs Today's date: 03/18/2018 Total lbs lost to date: 6 (Patients must lose 7 lbs in the first 6 months to continue with counseling)   ASK: We discussed the diagnosis of obesity with De Hollingshead today and Karalynn agreed to give [place] permission to discuss obesity behavioral modification therapy today.  ASSESS: Rumaisa has the diagnosis of obesity and her BMI today is 32.47 Marya is in the action stage of change   ADVISE: Chalsey was educated on the multiple health risks of obesity as well as the benefit of weight loss to improve her health. She was advised of the need for long term treatment and the importance of lifestyle modifications.  AGREE: Multiple dietary modification options and treatment options were discussed and  Allona agreed to the above obesity treatment plan.  I, Doreene Nest, am acting as transcriptionist for Dennard Nip, MD  I have reviewed the above documentation for accuracy and completeness, and I agree with the above. -Dennard Nip, MD

## 2018-03-23 ENCOUNTER — Encounter: Payer: Self-pay | Admitting: Physician Assistant

## 2018-03-23 ENCOUNTER — Ambulatory Visit (INDEPENDENT_AMBULATORY_CARE_PROVIDER_SITE_OTHER): Payer: Managed Care, Other (non HMO) | Admitting: Physician Assistant

## 2018-03-23 VITALS — BP 132/90 | HR 70 | Temp 97.9°F | Ht 70.0 in | Wt 228.2 lb

## 2018-03-23 DIAGNOSIS — Z0000 Encounter for general adult medical examination without abnormal findings: Secondary | ICD-10-CM

## 2018-03-23 DIAGNOSIS — D259 Leiomyoma of uterus, unspecified: Secondary | ICD-10-CM | POA: Insufficient documentation

## 2018-03-23 DIAGNOSIS — E538 Deficiency of other specified B group vitamins: Secondary | ICD-10-CM

## 2018-03-23 DIAGNOSIS — Z6832 Body mass index (BMI) 32.0-32.9, adult: Secondary | ICD-10-CM

## 2018-03-23 DIAGNOSIS — E559 Vitamin D deficiency, unspecified: Secondary | ICD-10-CM

## 2018-03-23 DIAGNOSIS — I10 Essential (primary) hypertension: Secondary | ICD-10-CM

## 2018-03-23 DIAGNOSIS — E785 Hyperlipidemia, unspecified: Secondary | ICD-10-CM

## 2018-03-23 DIAGNOSIS — Z0001 Encounter for general adult medical examination with abnormal findings: Secondary | ICD-10-CM

## 2018-03-23 DIAGNOSIS — R7303 Prediabetes: Secondary | ICD-10-CM

## 2018-03-23 DIAGNOSIS — Z79899 Other long term (current) drug therapy: Secondary | ICD-10-CM

## 2018-03-23 DIAGNOSIS — J45909 Unspecified asthma, uncomplicated: Secondary | ICD-10-CM

## 2018-03-23 HISTORY — DX: Leiomyoma of uterus, unspecified: D25.9

## 2018-03-23 NOTE — Patient Instructions (Addendum)
Add sublingual B12. The sublingual is better than the pills because likely you are not absorbing in your intestines well so the sublingual gets absorbed through your mouth and ensures you get the amount you need. Will help with energy, memory/concentration, decrease nerve pain, and help with weight loss. B12 is water soluble vitamin so you can not over dose on it, and anything you do not use will be sent out in your urine.    We are starting you on Metformin to prevent or treat diabetes.  Metformin does NOT cause low blood sugars.   In order to create energy your cells need insulin and sugar but sometime your cells do not accept the insulin and this can cause increased sugars and decreased energy.   The Metformin helps your cells accept insulin and the sugar this help: 1) increase your energy  2) weight loss.    The two most common side effects are nausea and diarrhea, follow these rules to avoid it but these symptoms get better with time on the medication.    ALSO You can take imodium per box instructions when starting metformin if needed.   Rules of metformin: 1) start out slow with only one pill daily. Our goal for you is 4 pills a day or 2000mg  total.  2) take with your largest meal. 3) Take with least amount of carbs.   Call if you have any problems.   Monitor your blood pressure at home, please keep a record and bring that in with you to your next office visit.   Go to the ER if any CP, SOB, nausea, dizziness, severe HA, changes vision/speech  Being dehydrated can hurt your kidneys, cause fatigue, headaches, muscle aches, joint pain, and dry skin/nails so please increase your fluids.   Drink 80-100 oz a day of water, measure it out! Eat 3 meals a day, have to do breakfast, eat protein- hard boiled eggs, protein bar like [place] protein bar, greek yogurt like oikos triple zero, chobani 100, or light n fit greek  Can check out plantnanny app on your phone to help you keep  track of your water  Check out  Mini habits for weight loss book  2 apps for tracking food is myfitness pal  loseit OR can take picture of your food   Due to a recent study, SPRINT, we have changed our goal for the systolic or top blood pressure number. Ideally we want your top number at 120.  In the [facility] Trial, 5000 people were randomized to a goal BP of 120 and 5000 people were randomized to a goal BP of less than 140. The patients with the goal BP at 120 had LESS DEMENTIA, LESS HEART ATTACKS, AND LESS STROKES, AS WELL AS OVERALL DECREASED MORTALITY OR DEATH RATE.   If you are willing, our goal BP is the top number of 120.  Your most recent BP: BP: 132/90   Take your medications faithfully as instructed. Maintain a healthy weight. Get at least 150 minutes of aerobic exercise per week. Minimize salt intake. Minimize alcohol intake  DASH Eating Plan DASH stands for "Dietary Approaches to Stop Hypertension." The DASH eating plan is a healthy eating plan that has been shown to reduce high blood pressure (hypertension). Additional health benefits may include reducing the risk of type 2 diabetes mellitus, heart disease, and stroke. The DASH eating plan may also help with weight loss. WHAT DO I NEED TO KNOW ABOUT THE DASH EATING PLAN? For the  DASH eating plan, you will follow these general guidelines:  Choose foods with a percent daily value for sodium of less than 5% (as listed on the food label).  Use salt-free seasonings or herbs instead of table salt or sea salt.  Check with your health care provider or pharmacist before using salt substitutes.  Eat lower-sodium products, often labeled as "lower sodium" or "no salt added."  Eat fresh foods.  Eat more vegetables, fruits, and low-fat dairy products.  Choose whole grains. Look for the word "whole" as the first word in the ingredient list.  Choose fish and skinless chicken or [place] more often than red meat. Limit fish, poultry,  and meat to 6 oz (170 g) each day.  Limit sweets, desserts, sugars, and sugary drinks.  Choose heart-healthy fats.  Limit cheese to 1 oz (28 g) per day.  Eat more home-cooked food and less restaurant, buffet, and fast food.  Limit fried foods.  Cook foods using methods other than frying.  Limit canned vegetables. If you do use them, rinse them well to decrease the sodium.  When eating at a restaurant, ask that your food be prepared with less salt, or no salt if possible. WHAT FOODS CAN I EAT? Seek help from a dietitian for individual calorie needs. Grains Whole grain or whole wheat bread. Brown rice. Whole grain or whole wheat pasta. Quinoa, bulgur, and whole grain cereals. Low-sodium cereals. Corn or whole wheat flour tortillas. Whole grain cornbread. Whole grain crackers. Low-sodium crackers. Vegetables Fresh or frozen vegetables (raw, steamed, roasted, or grilled). Low-sodium or reduced-sodium tomato and vegetable juices. Low-sodium or reduced-sodium tomato sauce and paste. Low-sodium or reduced-sodium canned vegetables.  Fruits All fresh, canned (in natural juice), or frozen fruits. Meat and Other Protein Products Ground beef (85% or leaner), grass-fed beef, or beef trimmed of fat. Skinless chicken or [place]. Ground chicken or [place]. Pork trimmed of fat. All fish and seafood. Eggs. Dried beans, peas, or lentils. Unsalted nuts and seeds. Unsalted canned beans. Dairy Low-fat dairy products, such as skim or 1% milk, 2% or reduced-fat cheeses, low-fat ricotta or cottage cheese, or plain low-fat yogurt. Low-sodium or reduced-sodium cheeses. Fats and Oils Tub margarines without trans fats. Light or reduced-fat mayonnaise and salad dressings (reduced sodium). Avocado. Safflower, olive, or canola oils. Natural peanut or almond butter. Other Unsalted popcorn and pretzels. The items listed above may not be a complete list of recommended foods or beverages. Contact your dietitian for more  options. WHAT FOODS ARE NOT RECOMMENDED? Grains White bread. White pasta. White rice. Refined cornbread. Bagels and croissants. Crackers that contain trans fat. Vegetables Creamed or fried vegetables. Vegetables in a cheese sauce. Regular canned vegetables. Regular canned tomato sauce and paste. Regular tomato and vegetable juices. Fruits Dried fruits. Canned fruit in light or heavy syrup. Fruit juice. Meat and Other Protein Products Fatty cuts of meat. Ribs, chicken wings, bacon, sausage, bologna, salami, chitterlings, fatback, hot dogs, bratwurst, and packaged luncheon meats. Salted nuts and seeds. Canned beans with salt. Dairy Whole or 2% milk, cream, half-and-half, and cream cheese. Whole-fat or sweetened yogurt. Full-fat cheeses or blue cheese. Nondairy creamers and whipped toppings. Processed cheese, cheese spreads, or cheese curds. Condiments Onion and garlic salt, seasoned salt, table salt, and sea salt. Canned and packaged gravies. Worcestershire sauce. Tartar sauce. Barbecue sauce. Teriyaki sauce. Soy sauce, including reduced sodium. Steak sauce. Fish sauce. Oyster sauce. Cocktail sauce. Horseradish. Ketchup and mustard. Meat flavorings and tenderizers. Bouillon cubes. Hot sauce. Tabasco sauce. Marinades. Taco  seasonings. Relishes. Fats and Oils Butter, stick margarine, lard, shortening, ghee, and bacon fat. Coconut, palm kernel, or palm oils. Regular salad dressings. Other Pickles and olives. Salted popcorn and pretzels. The items listed above may not be a complete list of foods and beverages to avoid. Contact your dietitian for more information. WHERE CAN I FIND MORE INFORMATION? National Heart, Lung, and Blood Institute: travelstabloid.com Document Released: 10/30/2011 Document Revised: 03/27/2014 Document Reviewed: 09/14/2013 [facility] Patient Information 2015 [place], [place]. This information is not intended to replace advice given to you by your  health care provider. Make sure you discuss any questions you have with your health care provider.  Veggies are great because you can eat a ton! They are low in calories, great to fill you up, and have a ton of vitamins, minerals, and protein.        Vitamin B12 Deficiency Vitamin B12 deficiency occurs when the body does not have enough vitamin B12. Vitamin B12 is an important vitamin. The body needs vitamin B12:  To make red blood cells.  To make DNA. This is the genetic material inside cells.  To help the nerves work properly so they can carry messages from the brain to the body.  Vitamin B12 deficiency can cause various health problems, such as a low red blood cell count (anemia) or nerve damage. What are the causes? This condition may be caused by:  Not eating enough foods that contain vitamin B12.  Not having enough stomach acid and digestive fluids to properly absorb vitamin B12 from the food that you eat.  Certain digestive system diseases that make it hard to absorb vitamin B12. These diseases include Crohn disease, chronic pancreatitis, and cystic fibrosis.  Pernicious anemia. This is a condition in which the body does not make enough of a protein (intrinsic factor), resulting in too few red blood cells.  Having a surgery in which part of the stomach or small intestine is removed.  Taking certain medicines that make it hard for the body to absorb vitamin B12. These medicines include: ? Heartburn medicine (antacids and proton pump inhibitors). ? An antibiotic medicine called neomycin. ? Some medicines that are used to treat diabetes, tuberculosis, gout, or high cholesterol.  What increases the risk? The following factors may make you more likely to develop a B12 deficiency:  Being older than age 23.  Eating a vegetarian or vegan diet, especially while you are pregnant.  Eating a poor diet while you are pregnant.  Taking certain drugs.  Having  alcoholism.  What are the signs or symptoms? In some cases, there are no symptoms of this condition. If the condition leads to anemia or nerve damage, various symptoms can occur, such as:  Weakness.  Fatigue..  Numbness or tingling in your hands and feet.  Redness and burning of the tongue.  Confusion or memory problems.  Depression.  Sensory problems, such as color blindness, ringing in the ears, or loss of taste.  Diarrhea or constipation.  Trouble walking.  If anemia is severe, symptoms can include:  Shortness of breath.  Dizziness.  Rapid heart rate (tachycardia).  How is this diagnosed? This condition may be diagnosed with a blood test to measure the level of vitamin B12 in your blood. You may have other tests to help find the cause of your vitamin B12 deficiency. These tests may include:  A complete blood count (CBC). This is a group of tests that measure certain characteristics of blood cells.  A blood test to  measure intrinsic factor.  An endoscopy. In this procedure, a thin tube with a camera on the end is used to look into your stomach or intestines.  How is this treated? Treatment for this condition depends on the cause. Common treatment options include:  Changing your eating and drinking habits, such as: ? Eating more foods that contain vitamin B12. ? Drinking less alcohol or no alcohol.  Taking vitamin B12 supplements. Your health care provider will tell you which dosage is best for you.  Getting vitamin B12 injections.  Follow these instructions at home:  Take supplements only as told by your health care provider. Follow the directions carefully.  Get any injections that are prescribed by your health care provider.  Do not miss your appointments.  Eat lots of healthy foods that contain vitamin B12. Ask your health care provider if you should work with a dietitian. Foods that contain vitamin B12 include: ? Meat. ? Meat from birds  (poultry). ? Fish. ? Eggs. ? Cereal and dairy products that are fortified. This means that vitamin B12 has been added to the food. Check the label on the package to see if the food is fortified.  Do not abuse alcohol.  Keep all follow-up visits as told by your health care provider. This is important. Contact a health care provider if:  Your symptoms come back. Get help right away if:  You develop shortness of breath.  You have chest pain.  You become dizzy or you lose consciousness. This information is not intended to replace advice given to you by your health care provider. Make sure you discuss any questions you have with your health care provider. Document Released: 02/02/2012 Document Revised: 04/23/2016 Document Reviewed: 03/28/2015 Elsevier Interactive Patient Education  2018 [employer].

## 2018-04-08 ENCOUNTER — Encounter (INDEPENDENT_AMBULATORY_CARE_PROVIDER_SITE_OTHER): Payer: Self-pay

## 2018-04-08 ENCOUNTER — Ambulatory Visit (INDEPENDENT_AMBULATORY_CARE_PROVIDER_SITE_OTHER): Payer: Managed Care, Other (non HMO) | Admitting: Family Medicine

## 2018-04-08 VITALS — BP 137/84 | HR 81 | Temp 99.9°F | Ht 70.0 in | Wt 224.0 lb

## 2018-04-08 DIAGNOSIS — E559 Vitamin D deficiency, unspecified: Secondary | ICD-10-CM | POA: Diagnosis not present

## 2018-04-08 DIAGNOSIS — R7303 Prediabetes: Secondary | ICD-10-CM

## 2018-04-08 DIAGNOSIS — Z9189 Other specified personal risk factors, not elsewhere classified: Secondary | ICD-10-CM

## 2018-04-08 DIAGNOSIS — Z6833 Body mass index (BMI) 33.0-33.9, adult: Secondary | ICD-10-CM

## 2018-04-08 DIAGNOSIS — E669 Obesity, unspecified: Secondary | ICD-10-CM

## 2018-04-08 DIAGNOSIS — F3289 Other specified depressive episodes: Secondary | ICD-10-CM

## 2018-04-08 MED ORDER — METFORMIN HCL 500 MG PO TABS: 500.0000 mg | ORAL_TABLET | Freq: Every day | ORAL | 0 refills | Status: DC

## 2018-04-08 MED ORDER — BUPROPION HCL ER (SR) 150 MG PO TB12: 150.0000 mg | ORAL_TABLET | Freq: Every day | ORAL | 0 refills | Status: DC

## 2018-04-08 MED ORDER — VITAMIN D (ERGOCALCIFEROL) 1.25 MG (50000 UNIT) PO CAPS: 50000.0000 [IU] | ORAL_CAPSULE | ORAL | 0 refills | Status: DC

## 2018-04-08 NOTE — Progress Notes (Signed)
Office: [phone]  /  Fax: [phone]   HPI:   Chief Complaint: OBESITY Sarah Warren is here to discuss her progress with her obesity treatment plan. She is on the Category 2 plan and is following her eating plan approximately 50 % of the time. She states she is swimming, bike riding, and on the treadmill for 45-60 minutes 2-4 times per week. Sarah Warren was off track over Mother's Day, had increased simple carbohydrates and not meal planning as well. She notes increased emotional eating and stress eating.  Her weight is 224 lb (101.6 kg) today and has gained 4 pounds since her last visit. She has lost 2 lbs since starting treatment with [place].  Pre-Diabetes Sarah Warren has a diagnosis of pre-diabetes based on her elevated Hgb A1c and was informed this puts her at greater risk of developing diabetes. She is stable on metformin and she denies nausea, vomiting, or hypoglycemia. She continues to work on diet and exercise to decrease risk of diabetes.   At risk for diabetes Sarah Warren is at higher than average risk for developing diabetes due to her obesity and pre-diabetes. She currently denies polyuria or polydipsia.  Vitamin D Deficiency Sarah Warren has a diagnosis of vitamin D deficiency. She is stable on prescription Vit D. She notes fatigue and denies nausea, vomiting or muscle weakness.  Depression with emotional eating behaviors Sarah Warren notes increased emotional and stress eating, feeling frustrated with herself and feeling more out of control. Sarah Warren struggles with emotional eating and using food for comfort to the extent that it is negatively impacting her health. She often snacks when she is not hungry. Sarah Warren sometimes feels she is out of control and then feels guilty that she made poor food choices. She has been working on behavior modification techniques to help reduce her emotional eating and has been somewhat successful. She shows no sign of suicidal or homicidal ideations.  Depression screen Sarah Warren 2/9  03/03/2018  Decreased Interest 0  Down, Depressed, Hopeless 0  PHQ - 2 Score 0  Altered sleeping 0  Tired, decreased energy 0  Change in appetite 1  Feeling bad or failure about yourself  0  Trouble concentrating 1  Moving slowly or fidgety/restless 0  Suicidal thoughts 0  PHQ-9 Score 2  Difficult doing work/chores Not difficult at all   ALLERGIES: No Known Allergies  MEDICATIONS: Current Outpatient Medications on File Prior to Visit  Medication Sig Dispense Refill  . GILDESS 1/20 1-20 MG-MCG tablet TAKE ONE TABLET BY MOUTH ONE TIME DAILY. NO REFILLS UNTIL OFFICE VISIT.  0  . Multiple Vitamin (MULTIVITAMIN) tablet Take 1 tablet by mouth daily.     No current facility-administered medications on file prior to visit.     PAST MEDICAL HISTORY: Past Medical History:  Diagnosis Date  . Ankle swelling   . Asthma    childhood  . Glaucoma   . Uterine fibroid     PAST SURGICAL HISTORY: Past Surgical History:  Procedure Laterality Date  . WISDOM TOOTH EXTRACTION      SOCIAL HISTORY: Social History   Tobacco Use  . Smoking status: Never Smoker  . Smokeless tobacco: Never Used  Substance Use Topics  . Alcohol use: Yes    Comment: occasional wine  . Drug use: No    FAMILY HISTORY: Family History  Problem Relation Age of Onset  . Hypertension Mother   . Diabetes Mother   . Hypertension Father   . Diabetes Father   . Diabetes Maternal Grandmother  ROS: Review of Systems  Constitutional: Positive for malaise/fatigue. Negative for weight loss.  Gastrointestinal: Negative for nausea and vomiting.  Genitourinary: Negative for frequency.  Musculoskeletal:       Negative muscle weakness  Endo/Heme/Allergies: Negative for polydipsia.       Negative hypoglycemia  Psychiatric/Behavioral: Positive for depression. Negative for suicidal ideas.    PHYSICAL EXAM: Blood pressure 137/84, pulse 81, temperature 99.9 F (37.7 C), temperature source Oral, height 5\' 10"   (1.778 m), weight 224 lb (101.6 kg), last menstrual period 03/11/2018, SpO2 97 %. Body mass index is 32.14 kg/m. Physical Exam  Constitutional: She is oriented to person, place, and time. She appears well-developed and well-nourished.  Cardiovascular: Normal rate.  Pulmonary/Chest: Effort normal.  Musculoskeletal: Normal range of motion.  Neurological: She is oriented to person, place, and time.  Skin: Skin is warm and dry.  Psychiatric: She has a normal mood and affect. Her behavior is normal.  Vitals reviewed.   RECENT LABS AND TESTS: BMET    Component Value Date/Time   NA 138 03/03/2018 1015   K 4.6 03/03/2018 1015   CL 104 03/03/2018 1015   CO2 23 03/03/2018 1015   GLUCOSE 94 03/03/2018 1015   GLUCOSE 91 06/01/2017 0957   BUN 10 03/03/2018 1015   CREATININE 0.81 03/03/2018 1015   CREATININE 0.81 06/01/2017 0957   CALCIUM 9.2 03/03/2018 1015   GFRNONAA 86 03/03/2018 1015   GFRNONAA 87 06/01/2017 0957   GFRAA 99 03/03/2018 1015   GFRAA >89 06/01/2017 0957   Lab Results  Component Value Date   HGBA1C 6.0 (H) 03/03/2018   HGBA1C 5.8 (H) 06/01/2017   HGBA1C 5.9 (H) 05/28/2016   HGBA1C 5.9 (H) 05/16/2015   HGBA1C 5.9 (H) 12/28/2014   Lab Results  Component Value Date   INSULIN 10.7 03/03/2018   CBC    Component Value Date/Time   WBC 7.1 03/03/2018 1015   WBC 7.3 06/01/2017 0957   RBC 5.41 (H) 03/03/2018 1015   RBC 5.38 (H) 06/01/2017 0957   HGB 15.6 03/03/2018 1015   HCT 46.2 03/03/2018 1015   PLT 306 06/01/2017 0957   MCV 85 03/03/2018 1015   MCH 28.8 03/03/2018 1015   MCH 28.3 06/01/2017 0957   MCHC 33.8 03/03/2018 1015   MCHC 33.0 06/01/2017 0957   RDW 14.4 03/03/2018 1015   LYMPHSABS 2.4 03/03/2018 1015   MONOABS 438 06/01/2017 0957   EOSABS 0.1 03/03/2018 1015   BASOSABS 0.0 03/03/2018 1015   Iron/TIBC/Ferritin/ %Sat    Component Value Date/Time   IRON 107 05/16/2015 1057   TIBC 295 05/16/2015 1057   FERRITIN 51 05/16/2015 1057   IRONPCTSAT  36 05/16/2015 1057   Lipid Panel     Component Value Date/Time   CHOL 187 03/03/2018 1015   TRIG 61 03/03/2018 1015   HDL 53 03/03/2018 1015   CHOLHDL 3.2 06/01/2017 0957   VLDL 14 06/01/2017 0957   LDLCALC 122 (H) 03/03/2018 1015   Hepatic Function Panel     Component Value Date/Time   PROT 7.4 03/03/2018 1015   ALBUMIN 3.9 03/03/2018 1015   AST 11 03/03/2018 1015   ALT 8 03/03/2018 1015   ALKPHOS 54 03/03/2018 1015   BILITOT 0.5 03/03/2018 1015   BILIDIR 0.1 06/01/2017 0957   IBILI 0.3 06/01/2017 0957      Component Value Date/Time   TSH 1.540 03/03/2018 1015   TSH 1.51 06/01/2017 0957   TSH 1.78 05/28/2016 0954  Results for JOYICE, MAGDA (MRN  [id number]) as of 04/08/2018 16:30  Ref. Range 03/03/2018 10:15  Vitamin D, 25-Hydroxy Latest Ref Range: 30.0 - 100.0 ng/mL 13.8 (L)    ASSESSMENT AND PLAN: Prediabetes - Plan: metFORMIN (GLUCOPHAGE) 500 MG tablet  Vitamin D deficiency - Plan: Vitamin D, Ergocalciferol, (DRISDOL) 50000 units CAPS capsule  Other depression - with emotional eating - Plan: buPROPion (WELLBUTRIN SR) 150 MG 12 hr tablet  At risk for diabetes mellitus  Class 1 obesity with serious comorbidity and body mass index (BMI) of 33.0 to 33.9 in adult, unspecified obesity type  PLAN:  Pre-Diabetes Rhyse will continue to work on weight loss, exercise, and decreasing simple carbohydrates in her diet to help decrease the risk of diabetes. We dicussed metformin including benefits and risks. She was informed that eating too many simple carbohydrates or too many calories at one sitting increases the likelihood of GI side effects. Taziyah agrees to continue taking metformin 500 mg q AM #30 and we will refill for 1 month. Miesha agrees to follow up with our clinic in 2 to 3 weeks as directed to monitor her progress.  Diabetes risk counselling Alliya was given extended (15 minutes) diabetes prevention counseling today. She is 47 y.o. female and has risk factors for  diabetes including obesity and pre-diabetes. We discussed intensive lifestyle modifications today with an emphasis on weight loss as well as increasing exercise and decreasing simple carbohydrates in her diet.  Vitamin D Deficiency Cena was informed that low vitamin D levels contributes to fatigue and are associated with obesity, breast, and colon cancer. Claretha agrees to continue take prescription Vit D @50 ,000 IU every week #4 and we will refill for 1 month. She will follow up for routine testing of vitamin D, at least 2-3 times per year. She was informed of the risk of over-replacement of vitamin D and agrees to not increase her dose unless she discusses this with [place] first. Jakhiya agrees to follow up with our clinic in 2 to 3 weeks.  Depression with Emotional Eating Behaviors We discussed behavior modification techniques today to help Livvy deal with her emotional eating and depression. Alannie agrees to start Wellbutrin SR 150 mg q AM #30 with no refills. Salem agrees to follow up with our clinic in 2 to 3 weeks.  Obesity Cherrish is currently in the action stage of change. As such, her goal is to continue with weight loss efforts She has agreed to follow the Category 2 plan Magaly has been instructed to work up to a goal of 150 minutes of combined cardio and strengthening exercise per week for weight loss and overall health benefits. We discussed the following Behavioral Modification Strategies today: increasing lean protein intake, decreasing simple carbohydrates  and emotional eating strategies   Carmell has agreed to follow up with our clinic in 2 to 3 weeks. She was informed of the importance of frequent follow up visits to maximize her success with intensive lifestyle modifications for her multiple health conditions.   OBESITY BEHAVIORAL INTERVENTION VISIT  Today's visit was # 3 out of 22.  Starting weight: 226 lbs Starting date: 03/03/18 Today's weight : 224 lbs  Today's date:  04/08/2018 Total lbs lost to date: 2 (Patients must lose 7 lbs in the first 6 months to continue with counseling)   ASK: We discussed the diagnosis of obesity with De Hollingshead today and Jenin agreed to give [place] permission to discuss obesity behavioral modification therapy today.  ASSESS: Gerldine has the diagnosis of obesity and her BMI  today is 32.14 Mailyn is in the action stage of change   ADVISE: Genever was educated on the multiple health risks of obesity as well as the benefit of weight loss to improve her health. She was advised of the need for long term treatment and the importance of lifestyle modifications.  AGREE: Multiple dietary modification options and treatment options were discussed and  Nicole agreed to the above obesity treatment plan.  I, Trixie Dredge, am acting as transcriptionist for Dennard Nip, MD  I have reviewed the above documentation for accuracy and completeness, and I agree with the above. -Dennard Nip, MD

## 2018-04-29 ENCOUNTER — Ambulatory Visit (INDEPENDENT_AMBULATORY_CARE_PROVIDER_SITE_OTHER): Payer: Managed Care, Other (non HMO) | Admitting: Family Medicine

## 2018-04-29 VITALS — BP 146/86 | HR 65 | Temp 98.1°F | Ht 70.0 in | Wt 223.0 lb

## 2018-04-29 DIAGNOSIS — F3289 Other specified depressive episodes: Secondary | ICD-10-CM

## 2018-04-29 DIAGNOSIS — I10 Essential (primary) hypertension: Secondary | ICD-10-CM

## 2018-04-29 DIAGNOSIS — E669 Obesity, unspecified: Secondary | ICD-10-CM | POA: Diagnosis not present

## 2018-04-29 DIAGNOSIS — Z9189 Other specified personal risk factors, not elsewhere classified: Secondary | ICD-10-CM | POA: Diagnosis not present

## 2018-04-29 DIAGNOSIS — Z6832 Body mass index (BMI) 32.0-32.9, adult: Secondary | ICD-10-CM

## 2018-04-29 NOTE — Progress Notes (Signed)
Office: [phone]  /  Fax: [phone]   HPI:   Chief Complaint: OBESITY Sarah Warren is here to discuss her progress with her obesity treatment plan. She is on the Category 2 plan and is following her eating plan approximately 30 % of the time. She states she is cycling and walking for 30 minutes 3 times per week. Sarah Warren continues to lose weight, but she is especially bored with her breakfast options. Hunger was decreased with recent Wellbutrin, to the point, she couldn't eat all her food. Her weight is 223 lb (101.2 kg) today and has had a weight loss of 1 pound over a period of 3 weeks since her last visit. She has lost 3 lbs since starting treatment with [place].  Hypertension Sarah Warren is a 47 y.o. female with hypertension. Her blood pressure is elevated again today at 146/86 and she is not on hypertensive's. Sarah Warren denies chest pain. She is working on diet and exercise and weight loss to help control her blood pressure with the goal of decreasing her risk of heart attack and stroke. [name] blood pressure is not currently controlled.  At risk for cardiovascular disease Sarah Warren is at a higher than average risk for cardiovascular disease due to obesity and hypertension. She currently denies any chest pain.  Depression with emotional eating behaviors Sarah Warren started Wellbutrin, but it took away all her appetite. Sarah Warren struggles with emotional eating and using food for comfort to the extent that it is negatively impacting her health. She often snacks when she is not hungry. Sarah Warren sometimes feels she is out of control and then feels guilty that she made poor food choices. She has been working on behavior modification techniques to help reduce her emotional eating and has been somewhat successful. She shows no sign of suicidal or homicidal ideations.  Depression screen [facility] 2/9 03/03/2018  Decreased Interest 0  Down, Depressed, Hopeless 0  PHQ - 2 Score 0  Altered sleeping 0  Tired,  decreased energy 0  Change in appetite 1  Feeling bad or failure about yourself  0  Trouble concentrating 1  Moving slowly or fidgety/restless 0  Suicidal thoughts 0  PHQ-9 Score 2  Difficult doing work/chores Not difficult at all     ALLERGIES: No Known Allergies  MEDICATIONS: Current Outpatient Medications on File Prior to Visit  Medication Sig Dispense Refill  . buPROPion (WELLBUTRIN SR) 150 MG 12 hr tablet Take 1 tablet (150 mg total) by mouth daily. 30 tablet 0  . GILDESS 1/20 1-20 MG-MCG tablet TAKE ONE TABLET BY MOUTH ONE TIME DAILY. NO REFILLS UNTIL OFFICE VISIT.  0  . metFORMIN (GLUCOPHAGE) 500 MG tablet Take 1 tablet (500 mg total) by mouth daily with breakfast. 30 tablet 0  . Multiple Vitamin (MULTIVITAMIN) tablet Take 1 tablet by mouth daily.    . Vitamin D, Ergocalciferol, (DRISDOL) 50000 units CAPS capsule Take 1 capsule (50,000 Units total) by mouth every 7 (seven) days. 4 capsule 0   No current facility-administered medications on file prior to visit.     PAST MEDICAL HISTORY: Past Medical History:  Diagnosis Date  . Ankle swelling   . Asthma    childhood  . Glaucoma   . Uterine fibroid     PAST SURGICAL HISTORY: Past Surgical History:  Procedure Laterality Date  . WISDOM TOOTH EXTRACTION      SOCIAL HISTORY: Social History   Tobacco Use  . Smoking status: Never Smoker  . Smokeless tobacco: Never Used  Substance Use  Topics  . Alcohol use: Yes    Comment: occasional wine  . Drug use: No    FAMILY HISTORY: Family History  Problem Relation Age of Onset  . Hypertension Mother   . Diabetes Mother   . Hypertension Father   . Diabetes Father   . Diabetes Maternal Grandmother     ROS: Review of Systems  Constitutional: Positive for weight loss.  Cardiovascular: Negative for chest pain.  Psychiatric/Behavioral: Positive for depression. Negative for suicidal ideas.    PHYSICAL EXAM: Blood pressure (!) 146/86, pulse 65, temperature 98.1 F  (36.7 C), temperature source Oral, height 5\' 10"  (1.778 m), weight 223 lb (101.2 kg), SpO2 99 %. Body mass index is 32 kg/m. Physical Exam  Constitutional: She is oriented to person, place, and time. She appears well-developed and well-nourished.  Cardiovascular: Normal rate.  Pulmonary/Chest: Effort normal.  Musculoskeletal: Normal range of motion.  Neurological: She is oriented to person, place, and time.  Skin: Skin is warm and dry.  Psychiatric: She has a normal mood and affect. Her behavior is normal.  Vitals reviewed.   RECENT LABS AND TESTS: BMET    Component Value Date/Time   NA 138 03/03/2018 1015   K 4.6 03/03/2018 1015   CL 104 03/03/2018 1015   CO2 23 03/03/2018 1015   GLUCOSE 94 03/03/2018 1015   GLUCOSE 91 06/01/2017 0957   BUN 10 03/03/2018 1015   CREATININE 0.81 03/03/2018 1015   CREATININE 0.81 06/01/2017 0957   CALCIUM 9.2 03/03/2018 1015   GFRNONAA 86 03/03/2018 1015   GFRNONAA 87 06/01/2017 0957   GFRAA 99 03/03/2018 1015   GFRAA >89 06/01/2017 0957   Lab Results  Component Value Date   HGBA1C 6.0 (H) 03/03/2018   HGBA1C 5.8 (H) 06/01/2017   HGBA1C 5.9 (H) 05/28/2016   HGBA1C 5.9 (H) 05/16/2015   HGBA1C 5.9 (H) 12/28/2014   Lab Results  Component Value Date   INSULIN 10.7 03/03/2018   CBC    Component Value Date/Time   WBC 7.1 03/03/2018 1015   WBC 7.3 06/01/2017 0957   RBC 5.41 (H) 03/03/2018 1015   RBC 5.38 (H) 06/01/2017 0957   HGB 15.6 03/03/2018 1015   HCT 46.2 03/03/2018 1015   PLT 306 06/01/2017 0957   MCV 85 03/03/2018 1015   MCH 28.8 03/03/2018 1015   MCH 28.3 06/01/2017 0957   MCHC 33.8 03/03/2018 1015   MCHC 33.0 06/01/2017 0957   RDW 14.4 03/03/2018 1015   LYMPHSABS 2.4 03/03/2018 1015   MONOABS 438 06/01/2017 0957   EOSABS 0.1 03/03/2018 1015   BASOSABS 0.0 03/03/2018 1015   Iron/TIBC/Ferritin/ %Sat    Component Value Date/Time   IRON 107 05/16/2015 1057   TIBC 295 05/16/2015 1057   FERRITIN 51 05/16/2015 1057    IRONPCTSAT 36 05/16/2015 1057   Lipid Panel     Component Value Date/Time   CHOL 187 03/03/2018 1015   TRIG 61 03/03/2018 1015   HDL 53 03/03/2018 1015   CHOLHDL 3.2 06/01/2017 0957   VLDL 14 06/01/2017 0957   LDLCALC 122 (H) 03/03/2018 1015   Hepatic Function Panel     Component Value Date/Time   PROT 7.4 03/03/2018 1015   ALBUMIN 3.9 03/03/2018 1015   AST 11 03/03/2018 1015   ALT 8 03/03/2018 1015   ALKPHOS 54 03/03/2018 1015   BILITOT 0.5 03/03/2018 1015   BILIDIR 0.1 06/01/2017 0957   IBILI 0.3 06/01/2017 0957      Component Value Date/Time  TSH 1.540 03/03/2018 1015   TSH 1.51 06/01/2017 0957   TSH 1.78 05/28/2016 0954   Results for AMARISSA, KOERNER (MRN [id number]) as of 04/29/2018 16:59  Ref. Range 03/03/2018 10:15  Vitamin D, 25-Hydroxy Latest Ref Range: 30.0 - 100.0 ng/mL 13.8 (L)   ASSESSMENT AND PLAN: Essential hypertension - Plan: hydrochlorothiazide (HYDRODIURIL) 12.5 MG tablet  Other depression - with emotional eating  At risk for heart disease  Class 1 obesity with serious comorbidity and body mass index (BMI) of 32.0 to 32.9 in adult, unspecified obesity type  PLAN:  Hypertension We discussed sodium restriction, working on healthy weight loss, and a regular exercise program as the means to achieve improved blood pressure control. Casi agreed with this plan and agreed to follow up as directed. We will continue to monitor her blood pressure as well as her progress with the above lifestyle modifications. She agrees to start HCTZ 12.5 mg qd #30 and increase her H2O intake. She will watch for signs of hypotension as she continues her lifestyle modifications.  Cardiovascular risk counseling Arelis was given extended (15 minutes) coronary artery disease prevention counseling today. She is 47 y.o. female and has risk factors for heart disease including obesity and hypertension. We discussed intensive lifestyle modifications today with an emphasis on specific  weight loss instructions and strategies. Pt was also informed of the importance of increasing exercise and decreasing saturated fats to help prevent heart disease.  Depression with Emotional Eating Behaviors We discussed behavior modification techniques today to help Kaleya deal with her emotional eating and depression. She is okay to decrease Wellbutrin SR 150 mg dose to half pill in the morning (patient educated this SR medication is okay to cut, unlike other XL medications). Angeleen agrees to follow up as directed.  Obesity Stefania is currently in the action stage of change. As such, her goal is to continue with weight loss efforts She has agreed to keep a food journal with 200 to 350 calories and 20 grams of protein at breakfast daily and follow the Category 2 plan Rashan has been instructed to work up to a goal of 150 minutes of combined cardio and strengthening exercise per week for weight loss and overall health benefits. We discussed the following Behavioral Modification Strategies today: increase H2O intake, increasing lean protein intake and work on meal planning and easy cooking plans  Jannie has agreed to follow up with our clinic in 2 to 3 weeks. She was informed of the importance of frequent follow up visits to maximize her success with intensive lifestyle modifications for her multiple health conditions.   OBESITY BEHAVIORAL INTERVENTION VISIT  Today's visit was # 4 out of 22.  Starting weight: 226 lbs Starting date: 03/03/18 Today's weight : 223 lbs Today's date: 04/29/2018 Total lbs lost to date: 3 (Patients must lose 7 lbs in the first 6 months to continue with counseling)   ASK: We discussed the diagnosis of obesity with De Hollingshead today and Hayle agreed to give [place] permission to discuss obesity behavioral modification therapy today.  ASSESS: Nidhi has the diagnosis of obesity and her BMI today is 31 Katherina is in the action stage of change   ADVISE: Jaila was  educated on the multiple health risks of obesity as well as the benefit of weight loss to improve her health. She was advised of the need for long term treatment and the importance of lifestyle modifications.  AGREE: Multiple dietary modification options and treatment options were discussed and  Malachy Mood  agreed to the above obesity treatment plan.  I, Doreene Nest, am acting as transcriptionist for Dennard Nip, MD  I have reviewed the above documentation for accuracy and completeness, and I agree with the above. -Dennard Nip, MD

## 2018-05-04 MED ORDER — HYDROCHLOROTHIAZIDE 12.5 MG PO TABS: 12.5000 mg | ORAL_TABLET | Freq: Every day | ORAL | 3 refills | Status: DC

## 2018-05-18 ENCOUNTER — Ambulatory Visit (INDEPENDENT_AMBULATORY_CARE_PROVIDER_SITE_OTHER): Payer: Managed Care, Other (non HMO) | Admitting: Family Medicine

## 2018-05-18 VITALS — BP 148/79 | HR 59 | Temp 98.1°F | Ht 70.0 in | Wt 224.0 lb

## 2018-05-18 DIAGNOSIS — I10 Essential (primary) hypertension: Secondary | ICD-10-CM

## 2018-05-18 DIAGNOSIS — E669 Obesity, unspecified: Secondary | ICD-10-CM

## 2018-05-18 DIAGNOSIS — E8881 Metabolic syndrome: Secondary | ICD-10-CM | POA: Diagnosis not present

## 2018-05-18 DIAGNOSIS — Z9189 Other specified personal risk factors, not elsewhere classified: Secondary | ICD-10-CM

## 2018-05-18 DIAGNOSIS — Z6832 Body mass index (BMI) 32.0-32.9, adult: Secondary | ICD-10-CM

## 2018-05-18 MED ORDER — CHLORTHALIDONE 25 MG PO TABS: 25.0000 mg | ORAL_TABLET | Freq: Every day | ORAL | 0 refills | Status: DC

## 2018-05-18 NOTE — Progress Notes (Signed)
Office: [phone]  /  Fax: [phone]   HPI:   Chief Complaint: OBESITY Sarah Warren is here to discuss her progress with her obesity treatment plan. She is on the keep a food journal with 200 to 350 calories and 20 grams of protein at breakfast daily and the Category 2 plan and is following her eating plan approximately 50 % of the time. She states she is doing cardio and weights for 15 minutes 3 times per week. Falicity did well with journaling for breakfast, but she didn't follow her plan for the rest of the day.  Her weight is 224 lb (101.6 kg) today and has had a weight gain of 1 pound over a period of 2 to 3 weeks since her last visit. She has lost 2 lbs since starting treatment with [place].  Hypertension Sarah Warren is a 47 y.o. female with hypertension. Her blood pressure is elevated today. She is on HCTZ but blood pressure is not controlled. Sarah Warren denies chest pain or headache. She is working weight loss to help control her blood pressure with the goal of decreasing her risk of heart attack and stroke.   At risk for cardiovascular disease Sarah Warren is at a higher than average risk for cardiovascular disease due to obesity and hypertension. She currently denies any chest pain.  Insulin Resistance Sarah Warren has a diagnosis of insulin resistance based on her elevated fasting insulin level >5. Although Sarah Warren's blood glucose readings are still under good control, insulin resistance puts her at greater risk of metabolic syndrome and diabetes. She is on metformin but she doesn't feel it is helping and she wants to discontinue it and work on diet control. Sarah Warren continues to work on diet and exercise to decrease risk of diabetes.  ALLERGIES: No Known Allergies  MEDICATIONS: Current Outpatient Medications on File Prior to Visit  Medication Sig Dispense Refill  . buPROPion (WELLBUTRIN SR) 150 MG 12 hr tablet Take 1 tablet (150 mg total) by mouth daily. 30 tablet 0  . GILDESS 1/20 1-20  MG-MCG tablet TAKE ONE TABLET BY MOUTH ONE TIME DAILY. NO REFILLS UNTIL OFFICE VISIT.  0  . Multiple Vitamin (MULTIVITAMIN) tablet Take 1 tablet by mouth daily.    . Vitamin D, Ergocalciferol, (DRISDOL) 50000 units CAPS capsule Take 1 capsule (50,000 Units total) by mouth every 7 (seven) days. 4 capsule 0   No current facility-administered medications on file prior to visit.     PAST MEDICAL HISTORY: Past Medical History:  Diagnosis Date  . Ankle swelling   . Asthma    childhood  . Glaucoma   . Uterine fibroid     PAST SURGICAL HISTORY: Past Surgical History:  Procedure Laterality Date  . WISDOM TOOTH EXTRACTION      SOCIAL HISTORY: Social History   Tobacco Use  . Smoking status: Never Smoker  . Smokeless tobacco: Never Used  Substance Use Topics  . Alcohol use: Yes    Comment: occasional wine  . Drug use: No    FAMILY HISTORY: Family History  Problem Relation Age of Onset  . Hypertension Mother   . Diabetes Mother   . Hypertension Father   . Diabetes Father   . Diabetes Maternal Grandmother     ROS: Review of Systems  Constitutional: Negative for weight loss.  Cardiovascular: Negative for chest pain.  Neurological: Negative for headaches.    PHYSICAL EXAM: Blood pressure (!) 148/79, pulse (!) 59, temperature 98.1 F (36.7 C), temperature source Oral, height 5\' 10"  (1.778  m), weight 224 lb (101.6 kg), SpO2 100 %. Body mass index is 32.14 kg/m. Physical Exam  Constitutional: She is oriented to person, place, and time. She appears well-developed and well-nourished.  Cardiovascular: Normal rate.  Pulmonary/Chest: Effort normal.  Musculoskeletal: Normal range of motion.  Neurological: She is oriented to person, place, and time.  Skin: Skin is warm and dry.  Psychiatric: She has a normal mood and affect. Her behavior is normal.  Vitals reviewed.   RECENT LABS AND TESTS: BMET    Component Value Date/Time   NA 138 03/03/2018 1015   K 4.6 03/03/2018  1015   CL 104 03/03/2018 1015   CO2 23 03/03/2018 1015   GLUCOSE 94 03/03/2018 1015   GLUCOSE 91 06/01/2017 0957   BUN 10 03/03/2018 1015   CREATININE 0.81 03/03/2018 1015   CREATININE 0.81 06/01/2017 0957   CALCIUM 9.2 03/03/2018 1015   GFRNONAA 86 03/03/2018 1015   GFRNONAA 87 06/01/2017 0957   GFRAA 99 03/03/2018 1015   GFRAA >89 06/01/2017 0957   Lab Results  Component Value Date   HGBA1C 6.0 (H) 03/03/2018   HGBA1C 5.8 (H) 06/01/2017   HGBA1C 5.9 (H) 05/28/2016   HGBA1C 5.9 (H) 05/16/2015   HGBA1C 5.9 (H) 12/28/2014   Lab Results  Component Value Date   INSULIN 10.7 03/03/2018   CBC    Component Value Date/Time   WBC 7.1 03/03/2018 1015   WBC 7.3 06/01/2017 0957   RBC 5.41 (H) 03/03/2018 1015   RBC 5.38 (H) 06/01/2017 0957   HGB 15.6 03/03/2018 1015   HCT 46.2 03/03/2018 1015   PLT 306 06/01/2017 0957   MCV 85 03/03/2018 1015   MCH 28.8 03/03/2018 1015   MCH 28.3 06/01/2017 0957   MCHC 33.8 03/03/2018 1015   MCHC 33.0 06/01/2017 0957   RDW 14.4 03/03/2018 1015   LYMPHSABS 2.4 03/03/2018 1015   MONOABS 438 06/01/2017 0957   EOSABS 0.1 03/03/2018 1015   BASOSABS 0.0 03/03/2018 1015   Iron/TIBC/Ferritin/ %Sat    Component Value Date/Time   IRON 107 05/16/2015 1057   TIBC 295 05/16/2015 1057   FERRITIN 51 05/16/2015 1057   IRONPCTSAT 36 05/16/2015 1057   Lipid Panel     Component Value Date/Time   CHOL 187 03/03/2018 1015   TRIG 61 03/03/2018 1015   HDL 53 03/03/2018 1015   CHOLHDL 3.2 06/01/2017 0957   VLDL 14 06/01/2017 0957   LDLCALC 122 (H) 03/03/2018 1015   Hepatic Function Panel     Component Value Date/Time   PROT 7.4 03/03/2018 1015   ALBUMIN 3.9 03/03/2018 1015   AST 11 03/03/2018 1015   ALT 8 03/03/2018 1015   ALKPHOS 54 03/03/2018 1015   BILITOT 0.5 03/03/2018 1015   BILIDIR 0.1 06/01/2017 0957   IBILI 0.3 06/01/2017 0957      Component Value Date/Time   TSH 1.540 03/03/2018 1015   TSH 1.51 06/01/2017 0957   TSH 1.78  05/28/2016 0954   Results for MIESHA, BACHMANN (MRN [id number]) as of 05/18/2018 17:52  Ref. Range 03/03/2018 10:15  Vitamin D, 25-Hydroxy Latest Ref Range: 30.0 - 100.0 ng/mL 13.8 (L)   ASSESSMENT AND PLAN: Essential hypertension - Plan: chlorthalidone (HYGROTON) 25 MG tablet  Insulin resistance  At risk for heart disease  Class 1 obesity with serious comorbidity and body mass index (BMI) of 32.0 to 32.9 in adult, unspecified obesity type  PLAN:  Hypertension We discussed sodium restriction, working on healthy weight loss, and a regular  exercise program as the means to achieve improved blood pressure control. Snigdha agreed with this plan and agreed to follow up as directed. We will continue to monitor her blood pressure as well as her progress with the above lifestyle modifications. She  Agrees to discontinue HCTZ and start Chlorthalidone 25 mg qd #30 with no refills. We will recheck labs in 1 month and she will watch for signs of hypotension as she continues her lifestyle modifications.  Cardiovascular risk counseling Opha was given extended (15 minutes) coronary artery disease prevention counseling today. She is 47 y.o. female and has risk factors for heart disease including obesity and hypertension. We discussed intensive lifestyle modifications today with an emphasis on specific weight loss instructions and strategies. Pt was also informed of the importance of increasing exercise and decreasing saturated fats to help prevent heart disease.  Insulin Resistance Raffaella will get back to weight loss, exercise, and decreasing simple carbohydrates in her diet to help decrease the risk of diabetes. She was informed that eating too many simple carbohydrates or too many calories at one sitting increases the likelihood of GI side effects. Lacrisha agrees to discontinue metformin. We will recheck labs in 1 month and Elody agreed to follow up with [place] as directed to monitor her  progress.  Obesity Syenna is currently in the action stage of change. As such, her goal is to continue with weight loss efforts She has agreed to change to keeping a food journal with 1100 to 1300 calories and 75+ grams of protein daily Maurica has been instructed to work up to a goal of 150 minutes of combined cardio and strengthening exercise per week for weight loss and overall health benefits. We discussed the following Behavioral Modification Strategies today: increasing lean protein intake, decreasing simple carbohydrates , increasing vegetables and work on meal planning and easy cooking plans  Lashanna has agreed to follow up with our clinic in 2 to 3 weeks. She was informed of the importance of frequent follow up visits to maximize her success with intensive lifestyle modifications for her multiple health conditions.   OBESITY BEHAVIORAL INTERVENTION VISIT  Today's visit was # 5 out of 22.  Starting weight: 226 lbs Starting date: 03/03/18 Today's weight : 224 lbs Today's date: 05/18/2018 Total lbs lost to date: 2 (Patients must lose 7 lbs in the first 6 months to continue with counseling)   ASK: We discussed the diagnosis of obesity with De Hollingshead today and Klara agreed to give [place] permission to discuss obesity behavioral modification therapy today.  ASSESS: Oveda has the diagnosis of obesity and her BMI today is 32.14 Minyon is in the action stage of change   ADVISE: Monya was educated on the multiple health risks of obesity as well as the benefit of weight loss to improve her health. She was advised of the need for long term treatment and the importance of lifestyle modifications.  AGREE: Multiple dietary modification options and treatment options were discussed and  Daci agreed to the above obesity treatment plan.  I, Doreene Nest, am acting as transcriptionist for Dennard Nip, MD  I have reviewed the above documentation for accuracy and completeness, and I  agree with the above. -Dennard Nip, MD

## 2018-06-01 ENCOUNTER — Encounter: Payer: Self-pay | Admitting: Physician Assistant

## 2018-06-09 ENCOUNTER — Encounter (INDEPENDENT_AMBULATORY_CARE_PROVIDER_SITE_OTHER): Payer: Self-pay

## 2018-06-09 ENCOUNTER — Ambulatory Visit (INDEPENDENT_AMBULATORY_CARE_PROVIDER_SITE_OTHER): Payer: Managed Care, Other (non HMO) | Admitting: Family Medicine

## 2019-03-23 DIAGNOSIS — R7309 Other abnormal glucose: Secondary | ICD-10-CM | POA: Insufficient documentation

## 2019-03-23 DIAGNOSIS — I10 Essential (primary) hypertension: Secondary | ICD-10-CM | POA: Insufficient documentation

## 2019-03-23 DIAGNOSIS — Z79899 Other long term (current) drug therapy: Secondary | ICD-10-CM | POA: Insufficient documentation

## 2019-03-23 NOTE — Progress Notes (Signed)
Complete Physical  Assessment and Plan: Encounter for general adult medical examination with abnormal findings 1 year  Hyperlipidemia LDL goal <130 -     Lipid panel check lipids decrease fatty foods increase activity.   Abnormal glucose -     Hemoglobin A1c Discussed disease progression and risks Discussed diet/exercise, weight management and risk modification  Vitamin D deficiency -     Vitamin D, Ergocalciferol, (DRISDOL) 1.25 MG (50000 UT) CAPS capsule; 1 pill twice a week for 8 week for severe def -     VITAMIN D 25 Hydroxy (Vit-D Deficiency, Fractures)  Benign labile hypertension -     CBC with Differential/Platelet -     COMPLETE METABOLIC PANEL WITH GFR -     TSH -     Urinalysis, Routine w reflex microscopic -     Microalbumin / creatinine urine ratio -     EKG 12-Lead - continue medications, DASH diet, exercise and monitor at home. Call if greater than 130/80.   Medication management -     Magnesium  Uncomplicated asthma, unspecified asthma severity, unspecified whether persistent Weight loss advised.   B12 deficiency -     Vitamin B12 Need to get on sublingual, and check to see if it is better, if not will need injections  Screening, anemia, deficiency, iron -     Iron,Total/Total Iron Binding Cap   Discussed med's effects and SE's. Screening labs and tests as requested with regular follow-up as recommended. Over 40 minutes of exam, counseling, chart review and critical decision making was performed  HPI  This very nice 48 y.o. AA female presents for complete physical.   She does not workout, denies CP/SOB. BP: 128/84  She has a history of childhood asthma that is well controlled, SHE DENIES ANY SOB OR DYSPNEA WITH EXERTION.  Finally, patient has history of Vitamin D Deficiency, she is not on anything at thist ime.  Lab Results  Component Value Date   VD25OH 13.8 (L) 03/03/2018   She has a 28 year old son, 12th grade PT or [employer].   BMI is Body mass  index is 34.52 kg/m., she is working on diet and exercise. She was doing weight loss classes but it not anymore and has gained the majority of her weight back.  Wt Readings from Last 3 Encounters:  03/24/19 240 lb 9.6 oz (109.1 kg)  05/18/18 224 lb (101.6 kg)  04/29/18 223 lb (101.2 kg)  Due to her obesity she has prediabetes that is being monitored. She denies diabetic polys.  Lab Results  Component Value Date   HGBA1C 6.0 (H) 03/03/2018  She has hyperlipidemia but is not on medication. Her last cholesterol was Lab Results  Component Value Date   CHOL 187 03/03/2018   HDL 53 03/03/2018   LDLCALC 122 (H) 03/03/2018   TRIG 61 03/03/2018   CHOLHDL 3.2 06/01/2017     Current Medications:  Current Outpatient Medications on File Prior to Visit  Medication Sig Dispense Refill  . GILDESS 1/20 1-20 MG-MCG tablet TAKE ONE TABLET BY MOUTH ONE TIME DAILY. NO REFILLS UNTIL OFFICE VISIT.  0  . Multiple Vitamin (MULTIVITAMIN) tablet Take 1 tablet by mouth daily.    . Vitamin D, Ergocalciferol, (DRISDOL) 50000 units CAPS capsule Take 1 capsule (50,000 Units total) by mouth every 7 (seven) days. 4 capsule 0   No current facility-administered medications on file prior to visit.    Health Maintenance:   Immunization History  Administered Date(s) Administered  .  Influenza-Unspecified 09/13/2015  . PPD Test 04/25/2014  . Tdap 04/25/2014, 09/13/2015   Works at [employer], she is in the [employer]. Worked there 10 years, works 8-5   TD/TDAP: 2015 Influenza: did not get this year PPD 2015 Pneumovax: N/A Prevnar 13: N/A  LMP: Patient's last menstrual period was 03/05/2019. Pap: 2019- Dr. Raphael Gibney with [facility] retired will have new provider, normal pap MGM: 07/2018 at work, mobile comes around [employer] Exam:  q 6 months  Last Eye Exam: Dr. Len Childs, 2017, glasses  Medical History:  Past Medical History:  Diagnosis Date  . Ankle swelling   . Asthma    childhood  .  Glaucoma   . Uterine fibroid    Allergies No Known Allergies  SURGICAL HISTORY She  has a past surgical history that includes Wisdom tooth extraction. FAMILY HISTORY Her family history includes Diabetes in her father, maternal grandmother, and mother; Hypertension in her father and mother. SOCIAL HISTORY She  reports that she has never smoked. She has never used smokeless tobacco. She reports current alcohol use. She reports that she does not use drugs. 4 x a month  Review of Systems: Review of Systems  Constitutional: Negative.   HENT: Negative.   Eyes: Negative.   Respiratory: Negative.   Cardiovascular: Negative.   Gastrointestinal: Negative.   Genitourinary: Negative.   Musculoskeletal: Negative.   Skin: Negative for itching and rash.  Neurological: Negative.   Endo/Heme/Allergies: Negative.   Psychiatric/Behavioral: Negative.     Physical Exam: Estimated body mass index is 34.52 kg/m as calculated from the following:   Height as of this encounter: 5\' 10"  (1.778 m).   Weight as of this encounter: 240 lb 9.6 oz (109.1 kg). BP 128/84   Pulse 60   Temp 97.6 F (36.4 C)   Ht 5\' 10"  (1.778 m)   Wt 240 lb 9.6 oz (109.1 kg)   LMP 03/05/2019   SpO2 99%   BMI 34.52 kg/m  General Appearance: Well nourished, in no apparent distress.  Eyes: PERRLA, EOMs, conjunctiva no swelling or erythema, normal fundi and vessels.  Sinuses: No Frontal/maxillary tenderness  ENT/Mouth: Ext aud canals clear, normal light reflex with TMs without erythema, bulging. Good dentition. No erythema, swelling, or exudate on post pharynx. Tonsils not swollen or erythematous. Hearing normal.  Neck: Supple, thyroid normal. No bruits  Respiratory: Respiratory effort normal, BS equal bilaterally without rales, rhonchi, wheezing or stridor.  Cardio: RRR without murmurs, rubs or gallops. Brisk peripheral pulses without edema.  Chest: symmetric, with normal excursions and percussion.  Breasts:  defer Abdomen: Soft, nontender, + fixed enlarged uterus, nontender, rebound, hernias, masses, or organomegaly.  Lymphatics: Non tender without lymphadenopathy.  Genitourinary: defer Musculoskeletal: Full ROM all peripheral extremities,5/5 strength, and normal gait.  Skin: Acne on nose. Warm, dry without rashes, lesions, ecchymosis. Neuro: Cranial nerves intact, reflexes equal bilaterally. Normal muscle tone, no cerebellar symptoms. Sensation intact.  Psych: Awake and oriented X 3, normal affect, Insight and Judgment appropriate.   EKG: defer  Vicie Mutters 9:18 AM [facility]

## 2019-03-24 ENCOUNTER — Ambulatory Visit (INDEPENDENT_AMBULATORY_CARE_PROVIDER_SITE_OTHER): Payer: Managed Care, Other (non HMO) | Admitting: Physician Assistant

## 2019-03-24 ENCOUNTER — Encounter: Payer: Self-pay | Admitting: Physician Assistant

## 2019-03-24 ENCOUNTER — Other Ambulatory Visit: Payer: Self-pay

## 2019-03-24 VITALS — BP 128/84 | HR 60 | Temp 97.6°F | Ht 70.0 in | Wt 240.6 lb

## 2019-03-24 DIAGNOSIS — E559 Vitamin D deficiency, unspecified: Secondary | ICD-10-CM | POA: Diagnosis not present

## 2019-03-24 DIAGNOSIS — Z136 Encounter for screening for cardiovascular disorders: Secondary | ICD-10-CM | POA: Diagnosis not present

## 2019-03-24 DIAGNOSIS — Z130 Encounter for screening for diseases of the blood and blood-forming organs and certain disorders involving the immune mechanism: Secondary | ICD-10-CM | POA: Diagnosis not present

## 2019-03-24 DIAGNOSIS — Z131 Encounter for screening for diabetes mellitus: Secondary | ICD-10-CM

## 2019-03-24 DIAGNOSIS — Z0000 Encounter for general adult medical examination without abnormal findings: Secondary | ICD-10-CM | POA: Diagnosis not present

## 2019-03-24 DIAGNOSIS — E785 Hyperlipidemia, unspecified: Secondary | ICD-10-CM

## 2019-03-24 DIAGNOSIS — J45909 Unspecified asthma, uncomplicated: Secondary | ICD-10-CM

## 2019-03-24 DIAGNOSIS — Z1389 Encounter for screening for other disorder: Secondary | ICD-10-CM

## 2019-03-24 DIAGNOSIS — I10 Essential (primary) hypertension: Secondary | ICD-10-CM

## 2019-03-24 DIAGNOSIS — Z79899 Other long term (current) drug therapy: Secondary | ICD-10-CM | POA: Diagnosis not present

## 2019-03-24 DIAGNOSIS — E538 Deficiency of other specified B group vitamins: Secondary | ICD-10-CM

## 2019-03-24 DIAGNOSIS — Z1211 Encounter for screening for malignant neoplasm of colon: Secondary | ICD-10-CM

## 2019-03-24 DIAGNOSIS — Z13220 Encounter for screening for lipoid disorders: Secondary | ICD-10-CM

## 2019-03-24 DIAGNOSIS — R7309 Other abnormal glucose: Secondary | ICD-10-CM

## 2019-03-24 DIAGNOSIS — Z0001 Encounter for general adult medical examination with abnormal findings: Secondary | ICD-10-CM

## 2019-03-24 MED ORDER — VITAMIN D (ERGOCALCIFEROL) 1.25 MG (50000 UNIT) PO CAPS
ORAL_CAPSULE | ORAL | 0 refills | Status: DC
Start: 2019-03-24 — End: 2019-04-01

## 2019-03-24 NOTE — Patient Instructions (Addendum)
Check out  Mini habits for weight loss book  2 apps for tracking food is myfitness pal  loseit OR can take picture of your food  You can try a B12 sunglingual from over the counter but we need to recheck your B12 after 2-4 weeks and if it is not better than we need to get your on the shots.   Vitamin B12 Deficiency Vitamin B12 deficiency occurs when the body does not have enough vitamin B12. Vitamin B12 is an important vitamin. The body needs vitamin B12:  To make red blood cells.  To make DNA. This is the genetic material inside cells.  To help the nerves work properly so they can carry messages from the brain to the body. Vitamin B12 deficiency can cause various health problems, such as a low red blood cell count (anemia) or nerve damage. What are the causes? This condition may be caused by:  Not eating enough foods that contain vitamin B12.  Not having enough stomach acid and digestive fluids to properly absorb vitamin B12 from the food that you eat.  Certain digestive system diseases that make it hard to absorb vitamin B12. These diseases include Crohn disease, chronic pancreatitis, and cystic fibrosis.  Pernicious anemia. This is a condition in which the body does not make enough of a protein (intrinsic factor), resulting in too few red blood cells.  Having a surgery in which part of the stomach or small intestine is removed.  Taking certain medicines that make it hard for the body to absorb vitamin B12. These medicines include: ? Heartburn medicine (antacids and proton pump inhibitors). ? An antibiotic medicine called neomycin. ? Some medicines that are used to treat diabetes, tuberculosis, gout, or high cholesterol. What increases the risk? The following factors may make you more likely to develop a B12 deficiency:  Being older than age 42.  Eating a vegetarian or vegan diet, especially while you are pregnant.  Eating a poor diet while you are pregnant.  Taking  certain drugs.  Having alcoholism. What are the signs or symptoms? In some cases, there are no symptoms of this condition. If the condition leads to anemia or nerve damage, various symptoms can occur, such as:  Weakness.  Fatigue.  Loss of appetite.  Weight loss.  Numbness or tingling in your hands and feet.  Redness and burning of the tongue.  Confusion or memory problems.  Depression.  Sensory problems, such as color blindness, ringing in the ears, or loss of taste.  Diarrhea or constipation.  Trouble walking. If anemia is severe, symptoms can include:  Shortness of breath.  Dizziness.  Rapid heart rate (tachycardia).  How is this diagnosed? This condition may be diagnosed with a blood test to measure the level of vitamin B12 in your blood. You may have other tests to help find the cause of your vitamin B12 deficiency. These tests may include:  A complete blood count (CBC). This is a group of tests that measure certain characteristics of blood cells.  A blood test to measure intrinsic factor.  An endoscopy. In this procedure, a thin tube with a camera on the end is used to look into your stomach or intestines. How is this treated? Treatment for this condition depends on the cause. Common treatment options include:  Changing your eating and drinking habits, such as: ? Eating more foods that contain vitamin B12. ? Drinking less alcohol or no alcohol.  Taking vitamin B12 supplements. Your health care provider will tell you  which dosage is best for you.  Getting vitamin B12 injections. Follow these instructions at home:  Take supplements only as told by your health care provider. Follow the directions carefully.  Get any injections that are prescribed by your health care provider.  Do not miss your appointments.  Eat lots of healthy foods that contain vitamin B12. Ask your health care provider if you should work with a dietitian. Foods that contain vitamin B12  include: ? Meat. ? Meat from birds (poultry). ? Fish. ? Eggs. ? Cereal and dairy products that are fortified. This means that vitamin B12 has been added to the food. Check the label on the package to see if the food is fortified.  Do not abuse alcohol.  Keep all follow-up visits as told by your health care provider. This is important. Contact a health care provider if:  Your symptoms come back. Get help right away if:  You develop shortness of breath.  You have chest pain.  You become dizzy or you lose consciousness. This information is not intended to replace advice given to you by your health care provider. Make sure you discuss any questions you have with your health care provider. Document Released: 02/02/2012 Document Revised: 04/23/2016 Document Reviewed: 03/28/2015 Elsevier Interactive Patient Education  2019 [place] D IS IMPORTANT  Vitamin D goal is between 60-80  Please make sure that you are taking your Vitamin D as directed.   It is very important as a natural anti-inflammatory   helping hair, skin, and nails, as well as reducing stroke and heart attack risk.   It helps your bones and helps with mood.  We want you on at least 50000 IU 2 x a week for 8 weeks  It also decreases numerous cancer risks so please take it as directed.   Low Vit D is associated with a 200-300% higher risk for CANCER   and 200-300% higher risk for HEART   ATTACK  &  STROKE.    .....................................Marland Kitchen  It is also associated with higher death rate at younger ages,   autoimmune diseases like Rheumatoid arthritis, Lupus, Multiple Sclerosis.     Also many other serious conditions, like depression, Alzheimer's  Dementia, infertility, muscle aches, fatigue, fibromyalgia - just to name a few.  +++++++++++++++++++  Can get liquid vitamin D from [facility] here in [place] at  [facility] alternatives [address], [place], [place] [address] Or you can try  earth fare   Google mindful eating and here are some tips and tricks below.   Rate your hunger before you eat on a scale of 1-10, try to eat closer to a 6 or higher. And if you are at below that, why are you eating? Slow down and listen to your body.

## 2019-03-25 LAB — CBC WITH DIFFERENTIAL/PLATELET
Absolute Monocytes: 518 cells/uL (ref 200–950)
Basophils Absolute: 21 cells/uL (ref 0–200)
Basophils Relative: 0.3 %
Eosinophils Absolute: 91 cells/uL (ref 15–500)
Eosinophils Relative: 1.3 %
HCT: 46.2 % — ABNORMAL HIGH (ref 35.0–45.0)
Hemoglobin: 15.7 g/dL — ABNORMAL HIGH (ref 11.7–15.5)
Lymphs Abs: 2576 cells/uL (ref 850–3900)
MCH: 28.1 pg (ref 27.0–33.0)
MCHC: 34 g/dL (ref 32.0–36.0)
MCV: 82.8 fL (ref 80.0–100.0)
MPV: 12.7 fL — ABNORMAL HIGH (ref 7.5–12.5)
Monocytes Relative: 7.4 %
Neutro Abs: 3794 cells/uL (ref 1500–7800)
Neutrophils Relative %: 54.2 %
Platelets: 259 10*3/uL (ref 140–400)
RBC: 5.58 10*6/uL — ABNORMAL HIGH (ref 3.80–5.10)
RDW: 13.3 % (ref 11.0–15.0)
Total Lymphocyte: 36.8 %
WBC: 7 10*3/uL (ref 3.8–10.8)

## 2019-03-25 LAB — URINALYSIS, ROUTINE W REFLEX MICROSCOPIC
Bacteria, UA: NONE SEEN /HPF
Bilirubin Urine: NEGATIVE
Glucose, UA: NEGATIVE
Hyaline Cast: NONE SEEN /LPF
Ketones, ur: NEGATIVE
Leukocytes,Ua: NEGATIVE
Nitrite: NEGATIVE
Protein, ur: NEGATIVE
Specific Gravity, Urine: 1.017 (ref 1.001–1.03)
Squamous Epithelial / HPF: NONE SEEN /HPF (ref <=5)
WBC, UA: NONE SEEN /HPF (ref 0–5)
pH: 5.5 (ref 5.0–8.0)

## 2019-03-25 LAB — HEMOGLOBIN A1C
Hgb A1c MFr Bld: 6.4 % of total Hgb — ABNORMAL HIGH (ref <5.7)
Mean Plasma Glucose: 137 (calc)
eAG (mmol/L): 7.6 (calc)

## 2019-03-25 LAB — COMPLETE METABOLIC PANEL WITH GFR
AG Ratio: 1 (calc) (ref 1.0–2.5)
ALT: 9 U/L (ref 6–29)
AST: 11 U/L (ref 10–35)
Albumin: 3.7 g/dL (ref 3.6–5.1)
Alkaline phosphatase (APISO): 50 U/L (ref 31–125)
BUN: 8 mg/dL (ref 7–25)
CO2: 23 mmol/L (ref 20–32)
Calcium: 8.8 mg/dL (ref 8.6–10.2)
Chloride: 108 mmol/L (ref 98–110)
Creat: 0.76 mg/dL (ref 0.50–1.10)
GFR, Est African American: 107 mL/min/{1.73_m2} (ref >=60)
GFR, Est Non African American: 92 mL/min/{1.73_m2} (ref >=60)
Globulin: 3.7 g/dL (calc) (ref 1.9–3.7)
Glucose, Bld: 93 mg/dL (ref 65–99)
Potassium: 4.1 mmol/L (ref 3.5–5.3)
Sodium: 138 mmol/L (ref 135–146)
Total Bilirubin: 0.4 mg/dL (ref 0.2–1.2)
Total Protein: 7.4 g/dL (ref 6.1–8.1)

## 2019-03-25 LAB — LIPID PANEL
Cholesterol: 188 mg/dL (ref <200)
HDL: 52 mg/dL (ref >=50)
LDL Cholesterol (Calc): 119 mg/dL (calc) — ABNORMAL HIGH
Non-HDL Cholesterol (Calc): 136 mg/dL (calc) — ABNORMAL HIGH (ref <130)
Total CHOL/HDL Ratio: 3.6 (calc) (ref <5.0)
Triglycerides: 71 mg/dL (ref <150)

## 2019-03-25 LAB — IRON, TOTAL/TOTAL IRON BINDING CAP
%SAT: 28 % (calc) (ref 16–45)
Iron: 102 ug/dL (ref 40–190)
TIBC: 359 mcg/dL (calc) (ref 250–450)

## 2019-03-25 LAB — MAGNESIUM: Magnesium: 2 mg/dL (ref 1.5–2.5)

## 2019-03-25 LAB — VITAMIN B12: Vitamin B-12: 190 pg/mL — ABNORMAL LOW (ref 200–1100)

## 2019-03-25 LAB — VITAMIN D 25 HYDROXY (VIT D DEFICIENCY, FRACTURES): Vit D, 25-Hydroxy: 18 ng/mL — ABNORMAL LOW (ref 30–100)

## 2019-03-25 LAB — MICROALBUMIN / CREATININE URINE RATIO
Creatinine, Urine: 147 mg/dL (ref 20–275)
Microalb Creat Ratio: 12 mcg/mg creat (ref <30)
Microalb, Ur: 1.7 mg/dL

## 2019-03-25 LAB — TSH: TSH: 1.25 mIU/L

## 2019-04-01 ENCOUNTER — Other Ambulatory Visit: Payer: Self-pay | Admitting: Internal Medicine

## 2019-04-01 DIAGNOSIS — E559 Vitamin D deficiency, unspecified: Secondary | ICD-10-CM

## 2019-04-01 MED ORDER — VITAMIN D (ERGOCALCIFEROL) 1.25 MG (50000 UNIT) PO CAPS: ORAL_CAPSULE | ORAL | 0 refills | Status: DC

## 2019-05-04 NOTE — Progress Notes (Deleted)
   Subjective:    Patient ID: Sarah Warren, female    DOB: May 24, 1971, 48 y.o.   MRN: [id number]  HPI 48 y.o. AAF presents for follow up for vitamin D, B12 def, and cholesterol. She also had hematuria last visit.   Lab Results  Component Value Date   VITAMINB12 190 (L) 03/24/2019   Lab Results  Component Value Date   VD25OH 18 (L) 03/24/2019    Medications Current Outpatient Medications on File Prior to Visit  Medication Sig  . GILDESS 1/20 1-20 MG-MCG tablet TAKE ONE TABLET BY MOUTH ONE TIME DAILY. NO REFILLS UNTIL OFFICE VISIT.  Marland Kitchen Multiple Vitamin (MULTIVITAMIN) tablet Take 1 tablet by mouth daily.  . Vitamin D, Ergocalciferol, (DRISDOL) 1.25 MG (50000 UT) CAPS capsule 1 pill twice a week for 8 week for severe def   No current facility-administered medications on file prior to visit.     Problem list She has Uncomplicated asthma; Prediabetes; Hyperlipidemia LDL goal <130; Obesity; Vitamin D deficiency; B12 deficiency; Uterine leiomyoma; Abnormal glucose; Medication management; and Benign labile hypertension on their problem list.   Review of Systems     Objective:   Physical Exam        Assessment & Plan:

## 2019-05-05 ENCOUNTER — Ambulatory Visit: Payer: Managed Care, Other (non HMO) | Admitting: Physician Assistant

## 2019-05-11 NOTE — Progress Notes (Signed)
Subjective:    Patient ID: Sarah Warren, female    DOB: [birth year], 48 y.o.   MRN: [id number]  HPI 48 y.o. AAF presents for follow up for vitamin D, B12 def, and cholesterol.  She also had hematuria last visit. She has a history of fibroids, has history of anemia due to this, she is on BCP for this. She normally has 3-4 days of menses, she has had some dark blood on the TP with wiping after urination occ, especially a few days before her menses. No fever, chills, discharge, diarrhea, constipation, AB pain, back pain. Had GYN visit 09/01/2018.  B12 sublingual, forgot every day but would take every 2-3 days.  Lab Results  Component Value Date   VITAMINB12 190 (L) 03/24/2019  On 1 pill twice a week for 8 weeks.  Lab Results  Component Value Date   VD25OH 18 (L) 03/24/2019   Lab Results  Component Value Date   CHOL 188 03/24/2019   HDL 52 03/24/2019   LDLCALC 119 (H) 03/24/2019   TRIG 71 03/24/2019   CHOLHDL 3.6 03/24/2019    Medications Current Outpatient Medications on File Prior to Visit  Medication Sig  . GILDESS 1/20 1-20 MG-MCG tablet TAKE ONE TABLET BY MOUTH ONE TIME DAILY. NO REFILLS UNTIL OFFICE VISIT.  Marland Kitchen Multiple Vitamin (MULTIVITAMIN) tablet Take 1 tablet by mouth daily.  . Vitamin D, Ergocalciferol, (DRISDOL) 1.25 MG (50000 UT) CAPS capsule 1 pill twice a week for 8 week for severe def   No current facility-administered medications on file prior to visit.     Problem list She has Uncomplicated asthma; Prediabetes; Hyperlipidemia LDL goal <130; Obesity; Vitamin D deficiency; B12 deficiency; Uterine leiomyoma; Abnormal glucose; Medication management; and Benign labile hypertension on their problem list.   Review of Systems  Constitutional: Positive for fatigue. Negative for activity change, appetite change, chills, diaphoresis, fever and unexpected weight change.  HENT: Negative.   Respiratory: Negative.   Cardiovascular: Negative.   Gastrointestinal: Negative.    Genitourinary: Positive for menstrual problem. Negative for difficulty urinating, dyspareunia, dysuria, enuresis, flank pain, frequency, genital sores, hematuria, pelvic pain and urgency.  Musculoskeletal: Negative.   Skin: Negative.   Neurological: Negative.   Hematological: Negative.   Psychiatric/Behavioral: Negative.        Objective:   Physical Exam General Appearance: Well nourished, in no apparent distress.  Eyes: PERRLA, EOMs, conjunctiva no swelling or erythema, normal fundi and vessels.  Sinuses: No Frontal/maxillary tenderness  ENT/Mouth: Ext aud canals clear, normal light reflex with TMs without erythema, bulging. Good dentition. No erythema, swelling, or exudate on post pharynx. Tonsils not swollen or erythematous. Hearing normal.  Neck: Supple, thyroid normal. No bruits  Respiratory: Respiratory effort normal, BS equal bilaterally without rales, rhonchi, wheezing or stridor.  Cardio: RRR without murmurs, rubs or gallops. Brisk peripheral pulses without edema.  Chest: symmetric, with normal excursions and percussion.  Breasts: defer Abdomen: Soft, nontender, + fixed enlarged uterus, nontender, rebound, hernias, masses, or organomegaly.  Lymphatics: Non tender without lymphadenopathy.  Genitourinary: defer Musculoskeletal: Full ROM all peripheral extremities,5/5 strength, and normal gait.  Skin: Acne on nose. Warm, dry without rashes, lesions, ecchymosis. Neuro: Cranial nerves intact, reflexes equal bilaterally. Normal muscle tone, no cerebellar symptoms. Sensation intact.  Psych: Awake and oriented X 3, normal affect, Insight and Judgment appropriate     Assessment & Plan:  Audery was seen today for follow-up.  Diagnoses and all orders for this visit:  B12 deficiency -  Vitamin B12 ? If she will need injections if not better with sublingual  Medication management -     CBC with Differential/Platelet -     COMPLETE METABOLIC PANEL WITH GFR  Vitamin D  deficiency -     VITAMIN D 25 Hydroxy (Vit-D Deficiency, Fractures)  Hematuria, unspecified type -     Urinalysis, Routine w reflex microscopic -     Urine Culture - ? Perimenopause, if continues will get her to follow up with GYN    Future Appointments  Date Time Provider [place]  04/02/2020  9:00 AM Vicie Mutters, PA-C [facility] None

## 2019-05-16 ENCOUNTER — Encounter: Payer: Self-pay | Admitting: Physician Assistant

## 2019-05-16 ENCOUNTER — Other Ambulatory Visit: Payer: Self-pay

## 2019-05-16 ENCOUNTER — Ambulatory Visit (INDEPENDENT_AMBULATORY_CARE_PROVIDER_SITE_OTHER): Payer: Managed Care, Other (non HMO) | Admitting: Physician Assistant

## 2019-05-16 VITALS — BP 122/80 | HR 58 | Temp 97.5°F | Wt 238.6 lb

## 2019-05-16 DIAGNOSIS — E559 Vitamin D deficiency, unspecified: Secondary | ICD-10-CM

## 2019-05-16 DIAGNOSIS — R319 Hematuria, unspecified: Secondary | ICD-10-CM

## 2019-05-16 DIAGNOSIS — Z79899 Other long term (current) drug therapy: Secondary | ICD-10-CM | POA: Diagnosis not present

## 2019-05-16 DIAGNOSIS — E538 Deficiency of other specified B group vitamins: Secondary | ICD-10-CM

## 2019-05-17 LAB — COMPLETE METABOLIC PANEL WITH GFR
AG Ratio: 1 (calc) (ref 1.0–2.5)
ALT: 14 U/L (ref 6–29)
AST: 14 U/L (ref 10–35)
Albumin: 3.6 g/dL (ref 3.6–5.1)
Alkaline phosphatase (APISO): 54 U/L (ref 31–125)
BUN: 15 mg/dL (ref 7–25)
CO2: 26 mmol/L (ref 20–32)
Calcium: 9.4 mg/dL (ref 8.6–10.2)
Chloride: 106 mmol/L (ref 98–110)
Creat: 0.84 mg/dL (ref 0.50–1.10)
GFR, Est African American: 95 mL/min/{1.73_m2} (ref >=60)
GFR, Est Non African American: 82 mL/min/{1.73_m2} (ref >=60)
Globulin: 3.6 g/dL (calc) (ref 1.9–3.7)
Glucose, Bld: 80 mg/dL (ref 65–99)
Potassium: 4 mmol/L (ref 3.5–5.3)
Sodium: 138 mmol/L (ref 135–146)
Total Bilirubin: 0.3 mg/dL (ref 0.2–1.2)
Total Protein: 7.2 g/dL (ref 6.1–8.1)

## 2019-05-17 LAB — CBC WITH DIFFERENTIAL/PLATELET
Absolute Monocytes: 569 cells/uL (ref 200–950)
Basophils Absolute: 40 cells/uL (ref 0–200)
Basophils Relative: 0.5 %
Eosinophils Absolute: 87 cells/uL (ref 15–500)
Eosinophils Relative: 1.1 %
HCT: 45.9 % — ABNORMAL HIGH (ref 35.0–45.0)
Hemoglobin: 15.3 g/dL (ref 11.7–15.5)
Lymphs Abs: 2891 cells/uL (ref 850–3900)
MCH: 27.8 pg (ref 27.0–33.0)
MCHC: 33.3 g/dL (ref 32.0–36.0)
MCV: 83.3 fL (ref 80.0–100.0)
MPV: 12.9 fL — ABNORMAL HIGH (ref 7.5–12.5)
Monocytes Relative: 7.2 %
Neutro Abs: 4313 cells/uL (ref 1500–7800)
Neutrophils Relative %: 54.6 %
Platelets: 257 10*3/uL (ref 140–400)
RBC: 5.51 10*6/uL — ABNORMAL HIGH (ref 3.80–5.10)
RDW: 13.2 % (ref 11.0–15.0)
Total Lymphocyte: 36.6 %
WBC: 7.9 10*3/uL (ref 3.8–10.8)

## 2019-05-17 LAB — URINALYSIS, ROUTINE W REFLEX MICROSCOPIC
Bacteria, UA: NONE SEEN /HPF
Bilirubin Urine: NEGATIVE
Glucose, UA: NEGATIVE
Hyaline Cast: NONE SEEN /LPF
Ketones, ur: NEGATIVE
Leukocytes,Ua: NEGATIVE
Nitrite: NEGATIVE
Protein, ur: NEGATIVE
Specific Gravity, Urine: 1.026 (ref 1.001–1.03)
Squamous Epithelial / HPF: NONE SEEN /HPF (ref <=5)
WBC, UA: NONE SEEN /HPF (ref 0–5)
pH: 5.5 (ref 5.0–8.0)

## 2019-05-17 LAB — URINE CULTURE
MICRO NUMBER:: 594180
SPECIMEN QUALITY:: ADEQUATE

## 2019-05-17 LAB — VITAMIN D 25 HYDROXY (VIT D DEFICIENCY, FRACTURES): Vit D, 25-Hydroxy: 58 ng/mL (ref 30–100)

## 2019-05-17 LAB — VITAMIN B12: Vitamin B-12: 1638 pg/mL — ABNORMAL HIGH (ref 200–1100)

## 2019-08-05 ENCOUNTER — Other Ambulatory Visit: Payer: Self-pay

## 2019-08-05 DIAGNOSIS — Z20822 Contact with and (suspected) exposure to covid-19: Secondary | ICD-10-CM

## 2019-08-08 LAB — NOVEL CORONAVIRUS, NAA: SARS-CoV-2, NAA: DETECTED — AB

## 2019-09-30 ENCOUNTER — Other Ambulatory Visit: Payer: Self-pay

## 2019-09-30 ENCOUNTER — Encounter: Payer: Self-pay | Admitting: Physician Assistant

## 2019-09-30 ENCOUNTER — Ambulatory Visit (INDEPENDENT_AMBULATORY_CARE_PROVIDER_SITE_OTHER): Payer: Managed Care, Other (non HMO) | Admitting: Physician Assistant

## 2019-09-30 VITALS — BP 150/92 | HR 55 | Temp 97.5°F | Wt 236.0 lb

## 2019-09-30 DIAGNOSIS — E559 Vitamin D deficiency, unspecified: Secondary | ICD-10-CM

## 2019-09-30 DIAGNOSIS — R7309 Other abnormal glucose: Secondary | ICD-10-CM

## 2019-09-30 DIAGNOSIS — Z23 Encounter for immunization: Secondary | ICD-10-CM

## 2019-09-30 DIAGNOSIS — E785 Hyperlipidemia, unspecified: Secondary | ICD-10-CM | POA: Diagnosis not present

## 2019-09-30 DIAGNOSIS — I10 Essential (primary) hypertension: Secondary | ICD-10-CM

## 2019-09-30 DIAGNOSIS — E538 Deficiency of other specified B group vitamins: Secondary | ICD-10-CM

## 2019-09-30 DIAGNOSIS — Z79899 Other long term (current) drug therapy: Secondary | ICD-10-CM

## 2019-09-30 MED ORDER — ENALAPRIL MALEATE 10 MG PO TABS: 10.0000 mg | ORAL_TABLET | Freq: Every day | ORAL | 11 refills | Status: DC

## 2019-09-30 NOTE — Progress Notes (Signed)
FOLLOW UP  Assessment and Plan:   Hypertension Add on ACE, discussed AE's including angioedema, will call if any issues or go to ER -Continue medication, monitor blood pressure at home. Continue DASH diet.  Reminder to go to the ER if any CP, SOB, nausea, dizziness, severe HA, changes vision/speech, left arm numbness and tingling and jaw pain.  Cholesterol -Continue diet and exercise. Check cholesterol.   Abnormal glucose -Continue diet and exercise. Check A1C Check to see if in DM range, has had some increase urination and thirst  Vitamin D Def - check level and continue medications.   Continue diet and meds as discussed. Further disposition pending results of labs. Over 30 minutes of exam, counseling, chart review, and critical decision making was performed  Future Appointments  Date Time Provider [place]  04/02/2020  9:00 AM Vicie Mutters, PA-C [id number] None     HPI 48 y.o.AA female  presents for follow up on hypertension, cholesterol, prediabetes, B12 def, and vitamin D deficiency, and states that her BP has been elevated. Marland Kitchen   Her blood pressure has been elevated, she went to GYN for annual and it was 160/102, she does not have a way to check it at home today their BP is BP: (!) 150/92  She is not on NSAIDS or cold medications.  She stopped taking her BCP in July, she is not on that right now.  Has never smoked.  She has had a mild headache, started when she knew her BP was elevated. No changes in her vision however she is on eye drops for glacoma, she is on lantoprost eye drops x 1 year.  She denies chest pain, shortness of breath, dizziness.  She had a normal CBC, normal TSH and normal FSH at her GYN recently.   BMI is Body mass index is 33.86 kg/m., she is working on diet and exercise. Wt Readings from Last 3 Encounters:  09/30/19 236 lb (107 kg)  05/16/19 238 lb 9.6 oz (108.2 kg)  03/24/19 240 lb 9.6 oz (109.1 kg)    She  is not  on cholesterol  medication and denies myalgias. Her cholesterol is not at goal. The cholesterol last visit was:   Lab Results  Component Value Date   CHOL 188 03/24/2019   HDL 52 03/24/2019   LDLCALC 119 (H) 03/24/2019   TRIG 71 03/24/2019   CHOLHDL 3.6 03/24/2019    She has been working on diet and exercise for prediabetes, and denies increased appetite, nausea, paresthesia of the feet and polydipsia. Last A1C in the office was:  Lab Results  Component Value Date   HGBA1C 6.4 (H) 03/24/2019   Patient is on Vitamin D supplement.   Lab Results  Component Value Date   VD25OH 58 05/16/2019       Current Medications:   Current Outpatient Medications (Endocrine & Metabolic):  Marland Kitchen  GILDESS 1/20 1-20 MG-MCG tablet, TAKE ONE TABLET BY MOUTH ONE TIME DAILY. NO REFILLS UNTIL OFFICE VISIT.     Current Outpatient Medications (Hematological):  Marland Kitchen  Cyanocobalamin (VITAMIN B12 PO), Take by mouth daily. Takes 3-4 tablets twice a week  Current Outpatient Medications (Other):  Marland Kitchen  Multiple Vitamin (MULTIVITAMIN) tablet, Take 1 tablet by mouth daily. .  Vitamin D, Ergocalciferol, (DRISDOL) 1.25 MG (50000 UT) CAPS capsule, 1 pill twice a week for 8 week for severe def  Medical History:  Past Medical History:  Diagnosis Date  . Ankle swelling   . Asthma    childhood  .  Glaucoma   . Uterine fibroid    Allergies: No Known Allergies   Review of Systems:  Review of Systems  Constitutional: Negative.   HENT: Negative.  Negative for hearing loss and tinnitus.   Eyes: Negative.  Negative for blurred vision.  Respiratory: Negative.  Negative for shortness of breath.   Cardiovascular: Negative.  Negative for chest pain, palpitations and leg swelling.  Gastrointestinal: Negative.   Genitourinary: Negative.   Musculoskeletal: Negative.   Skin: Negative.   Neurological: Positive for headaches (very mild). Negative for dizziness, tingling, speech change, focal weakness and weakness.  Endo/Heme/Allergies:  Negative.   Psychiatric/Behavioral: Negative.     Family history- Review and unchanged Social history- Review and unchanged Physical Exam: BP (!) 150/92   Pulse (!) 55   Temp (!) 97.5 F (36.4 C)   Wt 236 lb (107 kg)   SpO2 98%   BMI 33.86 kg/m  Wt Readings from Last 3 Encounters:  09/30/19 236 lb (107 kg)  05/16/19 238 lb 9.6 oz (108.2 kg)  03/24/19 240 lb 9.6 oz (109.1 kg)   General Appearance: Well nourished, in no apparent distress. Eyes: PERRLA, EOMs, conjunctiva no swelling or erythema Sinuses: No Frontal/maxillary tenderness ENT/Mouth: Ext aud canals clear, TMs without erythema, bulging. No erythema, swelling, or exudate on post pharynx.  Tonsils not swollen or erythematous. Hearing normal.  Neck: Supple, thyroid normal.  Respiratory: Respiratory effort normal, BS equal bilaterally without rales, rhonchi, wheezing or stridor.  Cardio: RRR with no MRGs. Brisk peripheral pulses without edema.  Abdomen: Soft, + BS,  Non tender, no guarding, rebound, hernias, masses. Lymphatics: Non tender without lymphadenopathy.  Musculoskeletal: Full ROM, 5/5 strength, Normal gait Skin: Warm, dry without rashes, lesions, ecchymosis.  Neuro: Cranial nerves intact. Normal muscle tone, no cerebellar symptoms. Psych: Awake and oriented X 3, normal affect, Insight and Judgment appropriate.    Vicie Mutters, PA-C 10:39 AM [facility]

## 2019-09-30 NOTE — Patient Instructions (Signed)
STARTING ACE MEDICATION  ACE inhibitors are blood pressure medications that protect your heart and kidneys. It can cause two symptoms: The most common symptom is a dry cough/tickle in your throat that can happen the first day you take it or 5 years after you have been taking it. Please call us if you have this and we can switch it to a different medications. The least common side effect is called angioedema which is swelling of your lips and tongue and can cause problems with your breathing. This is a very very rare side effect but very serious. If this happens please stop the medication and go to the ER.   Can take 2 of the enalapril if  BP is still over 120/70 Follow up 2-6 weeks for follow up  HYPERTENSION INFORMATION  Monitor your blood pressure at home, please keep a record and bring that in with you to your next office visit.   Go to the ER if any CP, SOB, nausea, dizziness, severe HA, changes vision/speech  Testing/Procedures: HOW TO TAKE YOUR BLOOD PRESSURE:  Rest 5 minutes before taking your blood pressure.  Don't smoke or drink caffeinated beverages for at least 30 minutes before.  Take your blood pressure before (not after) you eat.  Sit comfortably with your back supported and both feet on the floor (don't cross your legs).  Elevate your arm to heart level on a table or a desk.  Use the proper sized cuff. It should fit smoothly and snugly around your bare upper arm. There should be enough room to slip a fingertip under the cuff. The bottom edge of the cuff should be 1 inch above the crease of the elbow.  Due to a recent study, SPRINT, we have changed our goal for the systolic or top blood pressure number. Ideally we want your top number at 120.  In the [facility] Trial, 5000 people were randomized to a goal BP of 120 and 5000 people were randomized to a goal BP of less than 140. The patients with the goal BP at 120 had LESS DEMENTIA, LESS HEART ATTACKS, AND LESS STROKES, AS  WELL AS OVERALL DECREASED MORTALITY OR DEATH RATE.   There was another study that showed taking your blood pressure medications at night decrease cardiovascular events.  However if you are on a fluid pill, please take this in the morning.   If you are willing, our goal BP is the top number of 120.  Your most recent BP: BP: (!) 150/92   Take your medications faithfully as instructed. Maintain a healthy weight. Get at least 150 minutes of aerobic exercise per week. Minimize salt intake. Minimize alcohol intake  DASH Eating Plan DASH stands for "Dietary Approaches to Stop Hypertension." The DASH eating plan is a healthy eating plan that has been shown to reduce high blood pressure (hypertension). Additional health benefits may include reducing the risk of type 2 diabetes mellitus, heart disease, and stroke. The DASH eating plan may also help with weight loss. WHAT DO I NEED TO KNOW ABOUT THE DASH EATING PLAN? For the DASH eating plan, you will follow these general guidelines:  Choose foods with a percent daily value for sodium of less than 5% (as listed on the food label).  Use salt-free seasonings or herbs instead of table salt or sea salt.  Check with your health care provider or pharmacist before using salt substitutes.  Eat lower-sodium products, often labeled as "lower sodium" or "no salt added."  Eat fresh foods.  Eat more vegetables, fruits, and low-fat dairy products.  Choose whole grains. Look for the word "whole" as the first word in the ingredient list.  Choose fish and skinless chicken or [place] more often than red meat. Limit fish, poultry, and meat to 6 oz (170 g) each day.  Limit sweets, desserts, sugars, and sugary drinks.  Choose heart-healthy fats.  Limit cheese to 1 oz (28 g) per day.  Eat more home-cooked food and less restaurant, buffet, and fast food.  Limit fried foods.  Cook foods using methods other than frying.  Limit canned vegetables. If you do  use them, rinse them well to decrease the sodium.  When eating at a restaurant, ask that your food be prepared with less salt, or no salt if possible. WHAT FOODS CAN I EAT? Seek help from a dietitian for individual calorie needs. Grains Whole grain or whole wheat bread. Brown rice. Whole grain or whole wheat pasta. Quinoa, bulgur, and whole grain cereals. Low-sodium cereals. Corn or whole wheat flour tortillas. Whole grain cornbread. Whole grain crackers. Low-sodium crackers. Vegetables Fresh or frozen vegetables (raw, steamed, roasted, or grilled). Low-sodium or reduced-sodium tomato and vegetable juices. Low-sodium or reduced-sodium tomato sauce and paste. Low-sodium or reduced-sodium canned vegetables.  Fruits All fresh, canned (in natural juice), or frozen fruits. Meat and Other Protein Products Ground beef (85% or leaner), grass-fed beef, or beef trimmed of fat. Skinless chicken or [place]. Ground chicken or [place]. Pork trimmed of fat. All fish and seafood. Eggs. Dried beans, peas, or lentils. Unsalted nuts and seeds. Unsalted canned beans. Dairy Low-fat dairy products, such as skim or 1% milk, 2% or reduced-fat cheeses, low-fat ricotta or cottage cheese, or plain low-fat yogurt. Low-sodium or reduced-sodium cheeses. Fats and Oils Tub margarines without trans fats. Light or reduced-fat mayonnaise and salad dressings (reduced sodium). Avocado. Safflower, olive, or canola oils. Natural peanut or almond butter. Other Unsalted popcorn and pretzels. The items listed above may not be a complete list of recommended foods or beverages. Contact your dietitian for more options. WHAT FOODS ARE NOT RECOMMENDED? Grains White bread. White pasta. White rice. Refined cornbread. Bagels and croissants. Crackers that contain trans fat. Vegetables Creamed or fried vegetables. Vegetables in a cheese sauce. Regular canned vegetables. Regular canned tomato sauce and paste. Regular tomato and vegetable  juices. Fruits Dried fruits. Canned fruit in light or heavy syrup. Fruit juice. Meat and Other Protein Products Fatty cuts of meat. Ribs, chicken wings, bacon, sausage, bologna, salami, chitterlings, fatback, hot dogs, bratwurst, and packaged luncheon meats. Salted nuts and seeds. Canned beans with salt. Dairy Whole or 2% milk, cream, half-and-half, and cream cheese. Whole-fat or sweetened yogurt. Full-fat cheeses or blue cheese. Nondairy creamers and whipped toppings. Processed cheese, cheese spreads, or cheese curds. Condiments Onion and garlic salt, seasoned salt, table salt, and sea salt. Canned and packaged gravies. Worcestershire sauce. Tartar sauce. Barbecue sauce. Teriyaki sauce. Soy sauce, including reduced sodium. Steak sauce. Fish sauce. Oyster sauce. Cocktail sauce. Horseradish. Ketchup and mustard. Meat flavorings and tenderizers. Bouillon cubes. Hot sauce. Tabasco sauce. Marinades. Taco seasonings. Relishes. Fats and Oils Butter, stick margarine, lard, shortening, ghee, and bacon fat. Coconut, palm kernel, or palm oils. Regular salad dressings. Other Pickles and olives. Salted popcorn and pretzels. The items listed above may not be a complete list of foods and beverages to avoid. Contact your dietitian for more information. WHERE CAN I FIND MORE INFORMATION? National Heart, Lung, and Blood Institute: travelstabloid.com Document Released: 10/30/2011 Document Revised: 03/27/2014 Document  Reviewed: 09/14/2013 [employer] Patient Information 2015 [place], [place]. This information is not intended to replace advice given to you by your health care provider. Make sure you discuss any questions you have with your health care provider.

## 2019-10-01 LAB — COMPLETE METABOLIC PANEL WITH GFR
AG Ratio: 1.1 (calc) (ref 1.0–2.5)
ALT: 32 U/L — ABNORMAL HIGH (ref 6–29)
AST: 25 U/L (ref 10–35)
Albumin: 3.9 g/dL (ref 3.6–5.1)
Alkaline phosphatase (APISO): 62 U/L (ref 31–125)
BUN: 16 mg/dL (ref 7–25)
CO2: 25 mmol/L (ref 20–32)
Calcium: 9.5 mg/dL (ref 8.6–10.2)
Chloride: 107 mmol/L (ref 98–110)
Creat: 0.73 mg/dL (ref 0.50–1.10)
GFR, Est African American: 112 mL/min/{1.73_m2} (ref >=60)
GFR, Est Non African American: 97 mL/min/{1.73_m2} (ref >=60)
Globulin: 3.4 g/dL (calc) (ref 1.9–3.7)
Glucose, Bld: 97 mg/dL (ref 65–99)
Potassium: 4.2 mmol/L (ref 3.5–5.3)
Sodium: 141 mmol/L (ref 135–146)
Total Bilirubin: 0.4 mg/dL (ref 0.2–1.2)
Total Protein: 7.3 g/dL (ref 6.1–8.1)

## 2019-10-01 LAB — HEMOGLOBIN A1C
Hgb A1c MFr Bld: 6.3 % of total Hgb — ABNORMAL HIGH (ref <5.7)
Mean Plasma Glucose: 134 (calc)
eAG (mmol/L): 7.4 (calc)

## 2019-10-01 LAB — LIPID PANEL
Cholesterol: 199 mg/dL (ref <200)
HDL: 54 mg/dL (ref >=50)
LDL Cholesterol (Calc): 131 mg/dL (calc) — ABNORMAL HIGH
Non-HDL Cholesterol (Calc): 145 mg/dL (calc) — ABNORMAL HIGH (ref <130)
Total CHOL/HDL Ratio: 3.7 (calc) (ref <5.0)
Triglycerides: 50 mg/dL (ref <150)

## 2019-10-01 LAB — MAGNESIUM: Magnesium: 1.9 mg/dL (ref 1.5–2.5)

## 2019-10-01 LAB — VITAMIN D 25 HYDROXY (VIT D DEFICIENCY, FRACTURES): Vit D, 25-Hydroxy: 51 ng/mL (ref 30–100)

## 2019-10-27 ENCOUNTER — Other Ambulatory Visit: Payer: Self-pay

## 2019-10-27 ENCOUNTER — Ambulatory Visit (INDEPENDENT_AMBULATORY_CARE_PROVIDER_SITE_OTHER): Payer: Managed Care, Other (non HMO) | Admitting: Physician Assistant

## 2019-10-27 ENCOUNTER — Encounter: Payer: Self-pay | Admitting: Physician Assistant

## 2019-10-27 VITALS — BP 142/90 | HR 55 | Temp 97.7°F | Wt 232.0 lb

## 2019-10-27 DIAGNOSIS — I10 Essential (primary) hypertension: Secondary | ICD-10-CM

## 2019-10-27 MED ORDER — HYDROCHLOROTHIAZIDE 12.5 MG PO CAPS: ORAL_CAPSULE | ORAL | 1 refills | Status: DC

## 2019-10-27 NOTE — Patient Instructions (Signed)
I'm going to give you HCTZ This is a fluid pill that makes you pee more often, take it in the morning   If can deplete your magnesium and potassium so if you get leg cramps let me know and we may need to replace this Monitor your weight and blood pressure while on it If you get dizzy while on it stop the medication and call me Any questions or concerns stop the medication and call the office.   Start on 1 and can go up to 2 after 1 week  BP goal less than 130/80  HYPERTENSION INFORMATION  Monitor your blood pressure at home, please keep a record and bring that in with you to your next office visit.   Go to the ER if any CP, SOB, nausea, dizziness, severe HA, changes vision/speech  Testing/Procedures: HOW TO TAKE YOUR BLOOD PRESSURE:  Rest 5 minutes before taking your blood pressure.  Don't smoke or drink caffeinated beverages for at least 30 minutes before.  Take your blood pressure before (not after) you eat.  Sit comfortably with your back supported and both feet on the floor (don't cross your legs).  Elevate your arm to heart level on a table or a desk.  Use the proper sized cuff. It should fit smoothly and snugly around your bare upper arm. There should be enough room to slip a fingertip under the cuff. The bottom edge of the cuff should be 1 inch above the crease of the elbow.  Due to a recent study, SPRINT, we have changed our goal for the systolic or top blood pressure number. Ideally we want your top number at 120.  In the [facility] Trial, 5000 people were randomized to a goal BP of 120 and 5000 people were randomized to a goal BP of less than 140. The patients with the goal BP at 120 had LESS DEMENTIA, LESS HEART ATTACKS, AND LESS STROKES, AS WELL AS OVERALL DECREASED MORTALITY OR DEATH RATE.   There was another study that showed taking your blood pressure medications at night decrease cardiovascular events.  However if you are on a fluid pill, please take this in the  morning.   If you are willing, our goal BP is the top number of 120.  Your most recent BP: BP: (!) 142/90   Take your medications faithfully as instructed. Maintain a healthy weight. Get at least 150 minutes of aerobic exercise per week. Minimize salt intake. Minimize alcohol intake  DASH Eating Plan DASH stands for "Dietary Approaches to Stop Hypertension." The DASH eating plan is a healthy eating plan that has been shown to reduce high blood pressure (hypertension). Additional health benefits may include reducing the risk of type 2 diabetes mellitus, heart disease, and stroke. The DASH eating plan may also help with weight loss. WHAT DO I NEED TO KNOW ABOUT THE DASH EATING PLAN? For the DASH eating plan, you will follow these general guidelines:  Choose foods with a percent daily value for sodium of less than 5% (as listed on the food label).  Use salt-free seasonings or herbs instead of table salt or sea salt.  Check with your health care provider or pharmacist before using salt substitutes.  Eat lower-sodium products, often labeled as "lower sodium" or "no salt added."  Eat fresh foods.  Eat more vegetables, fruits, and low-fat dairy products.  Choose whole grains. Look for the word "whole" as the first word in the ingredient list.  Choose fish and skinless chicken or [place]  more often than red meat. Limit fish, poultry, and meat to 6 oz (170 g) each day.  Limit sweets, desserts, sugars, and sugary drinks.  Choose heart-healthy fats.  Limit cheese to 1 oz (28 g) per day.  Eat more home-cooked food and less restaurant, buffet, and fast food.  Limit fried foods.  Cook foods using methods other than frying.  Limit canned vegetables. If you do use them, rinse them well to decrease the sodium.  When eating at a restaurant, ask that your food be prepared with less salt, or no salt if possible. WHAT FOODS CAN I EAT? Seek help from a dietitian for individual calorie  needs. Grains Whole grain or whole wheat bread. Brown rice. Whole grain or whole wheat pasta. Quinoa, bulgur, and whole grain cereals. Low-sodium cereals. Corn or whole wheat flour tortillas. Whole grain cornbread. Whole grain crackers. Low-sodium crackers. Vegetables Fresh or frozen vegetables (raw, steamed, roasted, or grilled). Low-sodium or reduced-sodium tomato and vegetable juices. Low-sodium or reduced-sodium tomato sauce and paste. Low-sodium or reduced-sodium canned vegetables.  Fruits All fresh, canned (in natural juice), or frozen fruits. Meat and Other Protein Products Ground beef (85% or leaner), grass-fed beef, or beef trimmed of fat. Skinless chicken or [place]. Ground chicken or [place]. Pork trimmed of fat. All fish and seafood. Eggs. Dried beans, peas, or lentils. Unsalted nuts and seeds. Unsalted canned beans. Dairy Low-fat dairy products, such as skim or 1% milk, 2% or reduced-fat cheeses, low-fat ricotta or cottage cheese, or plain low-fat yogurt. Low-sodium or reduced-sodium cheeses. Fats and Oils Tub margarines without trans fats. Light or reduced-fat mayonnaise and salad dressings (reduced sodium). Avocado. Safflower, olive, or canola oils. Natural peanut or almond butter. Other Unsalted popcorn and pretzels. The items listed above may not be a complete list of recommended foods or beverages. Contact your dietitian for more options. WHAT FOODS ARE NOT RECOMMENDED? Grains White bread. White pasta. White rice. Refined cornbread. Bagels and croissants. Crackers that contain trans fat. Vegetables Creamed or fried vegetables. Vegetables in a cheese sauce. Regular canned vegetables. Regular canned tomato sauce and paste. Regular tomato and vegetable juices. Fruits Dried fruits. Canned fruit in light or heavy syrup. Fruit juice. Meat and Other Protein Products Fatty cuts of meat. Ribs, chicken wings, bacon, sausage, bologna, salami, chitterlings, fatback, hot dogs, bratwurst,  and packaged luncheon meats. Salted nuts and seeds. Canned beans with salt. Dairy Whole or 2% milk, cream, half-and-half, and cream cheese. Whole-fat or sweetened yogurt. Full-fat cheeses or blue cheese. Nondairy creamers and whipped toppings. Processed cheese, cheese spreads, or cheese curds. Condiments Onion and garlic salt, seasoned salt, table salt, and sea salt. Canned and packaged gravies. Worcestershire sauce. Tartar sauce. Barbecue sauce. Teriyaki sauce. Soy sauce, including reduced sodium. Steak sauce. Fish sauce. Oyster sauce. Cocktail sauce. Horseradish. Ketchup and mustard. Meat flavorings and tenderizers. Bouillon cubes. Hot sauce. Tabasco sauce. Marinades. Taco seasonings. Relishes. Fats and Oils Butter, stick margarine, lard, shortening, ghee, and bacon fat. Coconut, palm kernel, or palm oils. Regular salad dressings. Other Pickles and olives. Salted popcorn and pretzels. The items listed above may not be a complete list of foods and beverages to avoid. Contact your dietitian for more information. WHERE CAN I FIND MORE INFORMATION? National Heart, Lung, and Blood Institute: travelstabloid.com Document Released: 10/30/2011 Document Revised: 03/27/2014 Document Reviewed: 09/14/2013 [facility] Patient Information 2015 [place], [place]. This information is not intended to replace advice given to you by your health care provider. Make sure you discuss any questions you have with  your health care provider.

## 2019-10-27 NOTE — Progress Notes (Signed)
FOLLOW UP  Assessment and Plan:   Hypertension ACE, tolerating well, will add on HCTZ to lower diastolic number -Continue medication, monitor blood pressure at home. Continue DASH diet.  Reminder to go to the ER if any CP, SOB, nausea, dizziness, severe HA, changes vision/speech, left arm numbness and tingling and jaw pain.   Continue diet and meds as discussed. Further disposition pending results of labs. Over 30 minutes of exam, counseling, chart review, and critical decision making was performed  Future Appointments  Date Time Provider [place]  04/02/2020  9:00 AM Vicie Mutters, PA-C [facility] None     HPI 48 y.o.AA female  presents for follow up on hypertension, cholesterol, prediabetes, B12 def, and vitamin D deficiency, and states that her BP has been elevated. Marland Kitchen   Her blood pressure has been elevated, started on enalapril last visit, their BP is BP: (!) 142/90  She is not on NSAIDS or cold medications.  She stopped taking her BCP in July, she is not on that right now.  Has never smoked. Denies sleep apnea symptoms.   She denies chest pain, shortness of breath, dizziness, no headaches.   She had a normal CBC, normal TSH and normal FSH at her GYN recently.  Has lost some weight and is working on diet.  Lab Results  Component Value Date   GFRAA 112 09/30/2019   BMI is Body mass index is 33.29 kg/m., she is working on diet and exercise. Wt Readings from Last 3 Encounters:  10/27/19 232 lb (105.2 kg)  09/30/19 236 lb (107 kg)  05/16/19 238 lb 9.6 oz (108.2 kg)    She  is not  on cholesterol medication and denies myalgias. Her cholesterol is not at goal. The cholesterol last visit was:   Lab Results  Component Value Date   CHOL 199 09/30/2019   HDL 54 09/30/2019   LDLCALC 131 (H) 09/30/2019   TRIG 50 09/30/2019   CHOLHDL 3.7 09/30/2019    Current Medications:   Current Outpatient Medications (Endocrine & Metabolic):  Marland Kitchen  GILDESS 1/20 1-20 MG-MCG tablet,  TAKE ONE TABLET BY MOUTH ONE TIME DAILY. NO REFILLS UNTIL OFFICE VISIT.  Current Outpatient Medications (Cardiovascular):  .  enalapril (VASOTEC) 10 MG tablet, Take 1 tablet (10 mg total) by mouth daily.    Current Outpatient Medications (Hematological):  Marland Kitchen  Cyanocobalamin (VITAMIN B12 PO), Take by mouth daily. Takes 3-4 tablets twice a week  Current Outpatient Medications (Other):  Marland Kitchen  Latanoprost 0.005 % EMUL, Place 0.005 drops into both eyes at bedtime. .  Multiple Vitamin (MULTIVITAMIN) tablet, Take 1 tablet by mouth daily. .  timolol (TIMOPTIC) 0.5 % ophthalmic solution, Place 0.5 drops into both eyes 2 (two) times daily. .  Vitamin D, Ergocalciferol, (DRISDOL) 1.25 MG (50000 UT) CAPS capsule, 1 pill twice a week for 8 week for severe def  Medical History:  Past Medical History:  Diagnosis Date  . Ankle swelling   . Asthma    childhood  . Glaucoma   . Uterine fibroid    Allergies: No Known Allergies   Review of Systems:  Review of Systems  Constitutional: Negative.   HENT: Negative.  Negative for hearing loss and tinnitus.   Eyes: Negative.  Negative for blurred vision.  Respiratory: Negative.  Negative for shortness of breath.   Cardiovascular: Negative.  Negative for chest pain, palpitations and leg swelling.  Gastrointestinal: Negative.   Genitourinary: Negative.   Musculoskeletal: Negative.   Skin: Negative.   Neurological:  Positive for headaches (very mild). Negative for dizziness, tingling, speech change, focal weakness and weakness.  Endo/Heme/Allergies: Negative.   Psychiatric/Behavioral: Negative.     Family history- Review and unchanged Social history- Review and unchanged Physical Exam: BP (!) 142/90   Pulse (!) 55   Temp 97.7 F (36.5 C)   Wt 232 lb (105.2 kg)   SpO2 98%   BMI 33.29 kg/m  Wt Readings from Last 3 Encounters:  10/27/19 232 lb (105.2 kg)  09/30/19 236 lb (107 kg)  05/16/19 238 lb 9.6 oz (108.2 kg)   General Appearance: Well  nourished, in no apparent distress. Eyes: PERRLA, EOMs, conjunctiva no swelling or erythema Sinuses: No Frontal/maxillary tenderness ENT/Mouth: Ext aud canals clear, TMs without erythema, bulging. No erythema, swelling, or exudate on post pharynx.  Tonsils not swollen or erythematous. Hearing normal.  Neck: Supple, thyroid normal.  Respiratory: Respiratory effort normal, BS equal bilaterally without rales, rhonchi, wheezing or stridor.  Cardio: RRR with no MRGs. Brisk peripheral pulses without edema.  Abdomen: Soft, + BS,  Non tender, no guarding, rebound, hernias, masses. Lymphatics: Non tender without lymphadenopathy.  Musculoskeletal: Full ROM, 5/5 strength, Normal gait Skin: Warm, dry without rashes, lesions, ecchymosis.  Neuro: Cranial nerves intact. Normal muscle tone, no cerebellar symptoms. Psych: Awake and oriented X 3, normal affect, Insight and Judgment appropriate.    Vicie Mutters, PA-C 3:19 PM [facility]

## 2019-12-01 MED ORDER — OLMESARTAN MEDOXOMIL 20 MG PO TABS: 20.0000 mg | ORAL_TABLET | Freq: Every day | ORAL | 1 refills | Status: DC

## 2019-12-21 NOTE — Progress Notes (Signed)
FOLLOW UP  Assessment and Plan:   Benign labile hypertension - continue medications, DASH diet, exercise and monitor at home. Call if greater than 130/80.  Stop benicar switch to norvasc, continue hctz, check kidney function -     CBC with Differential/Platelet -     COMPLETE METABOLIC PANEL WITH GFR -     TSH -     amLODipine (NORVASC) 5 MG tablet; Take 1 tablet (5 mg total) by mouth daily.  Abnormal glucose -     Hemoglobin A1c Discussed disease progression and risks Discussed diet/exercise, weight management and risk modification  Hyperlipidemia LDL goal <130 -     Lipid panel check lipids decrease fatty foods increase activity.   B12 deficiency   Continue diet and meds as discussed. Further disposition pending results of labs. Over 30 minutes of exam, counseling, chart review, and critical decision making was performed  Future Appointments  Date Time Provider [place]  04/02/2020  9:00 AM Vicie Mutters, PA-C [facility] None     HPI 49 y.o.AA female  presents for follow up on hypertension, cholesterol, prediabetes, B12 def, and vitamin D deficiency, and states that her BP has been elevated. Marland Kitchen   Her blood pressure has been elevated, she was started on benicar and HCTZ 12.5mg  and her BP has been better however she states her hair is thinning with the new meds. She is checking her BP at home and it has been 120's/80.  BP Readings from Last 3 Encounters:  12/22/19 120/60  10/27/19 (!) 142/90  09/30/19 (!) 150/92    She denies chest pain, shortness of breath, dizziness.  She had a normal CBC, normal TSH and normal FSH at her GYN recently.   BMI is Body mass index is 34.03 kg/m., she is working on diet and exercise. Wt Readings from Last 3 Encounters:  12/22/19 237 lb 3.2 oz (107.6 kg)  10/27/19 232 lb (105.2 kg)  09/30/19 236 lb (107 kg)    She  is not  on cholesterol medication and denies myalgias. Her cholesterol is not at goal. The cholesterol last  visit was:   Lab Results  Component Value Date   CHOL 199 09/30/2019   HDL 54 09/30/2019   LDLCALC 131 (H) 09/30/2019   TRIG 50 09/30/2019   CHOLHDL 3.7 09/30/2019    She has been working on diet and exercise for prediabetes, and denies increased appetite, nausea, paresthesia of the feet and polydipsia. Last A1C in the office was:  Lab Results  Component Value Date   HGBA1C 6.3 (H) 09/30/2019   Patient is on Vitamin D supplement.   Lab Results  Component Value Date   VD25OH 51 09/30/2019     Lab Results  Component Value Date   VITAMINB12 1,638 (H) 05/16/2019     Current Medications:   Current Outpatient Medications (Endocrine & Metabolic):  Marland Kitchen  GILDESS 1/20 1-20 MG-MCG tablet, TAKE ONE TABLET BY MOUTH ONE TIME DAILY. NO REFILLS UNTIL OFFICE VISIT.  Current Outpatient Medications (Cardiovascular):  .  hydrochlorothiazide (MICROZIDE) 12.5 MG capsule, 1-2 pills a day for blood pressure. Marland Kitchen  amLODipine (NORVASC) 5 MG tablet, Take 1 tablet (5 mg total) by mouth daily.    Current Outpatient Medications (Hematological):  Marland Kitchen  Cyanocobalamin (VITAMIN B12 PO), Take by mouth daily. Takes 3-4 tablets twice a week  Current Outpatient Medications (Other):  Marland Kitchen  Latanoprost 0.005 % EMUL, Place 0.005 drops into both eyes at bedtime. .  Multiple Vitamin (MULTIVITAMIN) tablet, Take 1 tablet  by mouth daily. .  timolol (TIMOPTIC) 0.5 % ophthalmic solution, Place 0.5 drops into both eyes 2 (two) times daily. .  Vitamin D, Ergocalciferol, (DRISDOL) 1.25 MG (50000 UT) CAPS capsule, 1 pill twice a week for 8 week for severe def  Medical History:  Past Medical History:  Diagnosis Date  . Ankle swelling   . Asthma    childhood  . Glaucoma   . Uterine fibroid    Allergies: No Known Allergies   Review of Systems:  Review of Systems  Constitutional: Negative.   HENT: Negative.  Negative for hearing loss and tinnitus.   Eyes: Negative.  Negative for blurred vision.  Respiratory: Negative.   Negative for shortness of breath.   Cardiovascular: Negative.  Negative for chest pain, palpitations and leg swelling.  Gastrointestinal: Negative.   Genitourinary: Negative.   Musculoskeletal: Negative.   Skin: Negative.   Neurological: Positive for headaches (very mild). Negative for dizziness, tingling, speech change, focal weakness and weakness.  Endo/Heme/Allergies: Negative.   Psychiatric/Behavioral: Negative.     Family history- Review and unchanged Social history- Review and unchanged Physical Exam: BP 120/60   Pulse 60   Temp (!) 97.2 F (36.2 C)   Wt 237 lb 3.2 oz (107.6 kg)   SpO2 97%   BMI 34.03 kg/m  Wt Readings from Last 3 Encounters:  12/22/19 237 lb 3.2 oz (107.6 kg)  10/27/19 232 lb (105.2 kg)  09/30/19 236 lb (107 kg)   General Appearance: Well nourished, in no apparent distress. Eyes: PERRLA, EOMs, conjunctiva no swelling or erythema Sinuses: No Frontal/maxillary tenderness ENT/Mouth: Ext aud canals clear, TMs without erythema, bulging. No erythema, swelling, or exudate on post pharynx.  Tonsils not swollen or erythematous. Hearing normal.  Neck: Supple, thyroid normal.  Respiratory: Respiratory effort normal, BS equal bilaterally without rales, rhonchi, wheezing or stridor.  Cardio: RRR with no MRGs. Brisk peripheral pulses without edema.  Abdomen: Soft, + BS,  Non tender, no guarding, rebound, hernias, masses. Lymphatics: Non tender without lymphadenopathy.  Musculoskeletal: Full ROM, 5/5 strength, Normal gait Skin: Warm, dry without rashes, lesions, ecchymosis.  Neuro: Cranial nerves intact. Normal muscle tone, no cerebellar symptoms. Psych: Awake and oriented X 3, normal affect, Insight and Judgment appropriate.    Vicie Mutters, PA-C 4:06 PM [facility]

## 2019-12-22 ENCOUNTER — Ambulatory Visit (INDEPENDENT_AMBULATORY_CARE_PROVIDER_SITE_OTHER): Payer: Managed Care, Other (non HMO) | Admitting: Physician Assistant

## 2019-12-22 ENCOUNTER — Other Ambulatory Visit: Payer: Self-pay

## 2019-12-22 ENCOUNTER — Encounter: Payer: Self-pay | Admitting: Physician Assistant

## 2019-12-22 VITALS — BP 120/60 | HR 60 | Temp 97.2°F | Wt 237.2 lb

## 2019-12-22 DIAGNOSIS — I10 Essential (primary) hypertension: Secondary | ICD-10-CM | POA: Diagnosis not present

## 2019-12-22 DIAGNOSIS — R7309 Other abnormal glucose: Secondary | ICD-10-CM

## 2019-12-22 DIAGNOSIS — Z79899 Other long term (current) drug therapy: Secondary | ICD-10-CM

## 2019-12-22 DIAGNOSIS — E785 Hyperlipidemia, unspecified: Secondary | ICD-10-CM | POA: Diagnosis not present

## 2019-12-22 DIAGNOSIS — E538 Deficiency of other specified B group vitamins: Secondary | ICD-10-CM

## 2019-12-22 MED ORDER — AMLODIPINE BESYLATE 5 MG PO TABS: 5.0000 mg | ORAL_TABLET | Freq: Every day | ORAL | 1 refills | Status: DC

## 2019-12-22 NOTE — Patient Instructions (Addendum)
Check out hair loss due to stress called telogen effluvium Stop the benicar due to hair loss, continue the HCTZ and add the norvasc 5 mg, 1/2-1 tablet daily Work on cutting back on salt Continue exercise Continue to get enough sleep  HYPERTENSION INFORMATION  Monitor your blood pressure at home, please keep a record and bring that in with you to your next office visit.   Go to the ER if any CP, SOB, nausea, dizziness, severe HA, changes vision/speech  Testing/Procedures: HOW TO TAKE YOUR BLOOD PRESSURE:  Rest 5 minutes before taking your blood pressure.  Don't smoke or drink caffeinated beverages for at least 30 minutes before.  Take your blood pressure before (not after) you eat.  Sit comfortably with your back supported and both feet on the floor (don't cross your legs).  Elevate your arm to heart level on a table or a desk.  Use the proper sized cuff. It should fit smoothly and snugly around your bare upper arm. There should be enough room to slip a fingertip under the cuff. The bottom edge of the cuff should be 1 inch above the crease of the elbow.  Due to a recent study, SPRINT, we have changed our goal for the systolic or top blood pressure number. Ideally we want your top number at 120.  In the [facility] Trial, 5000 people were randomized to a goal BP of 120 and 5000 people were randomized to a goal BP of less than 140. The patients with the goal BP at 120 had LESS DEMENTIA, LESS HEART ATTACKS, AND LESS STROKES, AS WELL AS OVERALL DECREASED MORTALITY OR DEATH RATE.   There was another study that showed taking your blood pressure medications at night decrease cardiovascular events.  However if you are on a fluid pill, please take this in the morning.   If you are willing, our goal BP is the top number of 120.  Your most recent BP: BP: 140/74   Take your medications faithfully as instructed. Maintain a healthy weight. Get at least 150 minutes of aerobic exercise per  week. Minimize salt intake. Minimize alcohol intake  DASH Eating Plan DASH stands for "Dietary Approaches to Stop Hypertension." The DASH eating plan is a healthy eating plan that has been shown to reduce high blood pressure (hypertension). Additional health benefits may include reducing the risk of type 2 diabetes mellitus, heart disease, and stroke. The DASH eating plan may also help with weight loss. WHAT DO I NEED TO KNOW ABOUT THE DASH EATING PLAN? For the DASH eating plan, you will follow these general guidelines:  Choose foods with a percent daily value for sodium of less than 5% (as listed on the food label).  Use salt-free seasonings or herbs instead of table salt or sea salt.  Check with your health care provider or pharmacist before using salt substitutes.  Eat lower-sodium products, often labeled as "lower sodium" or "no salt added."  Eat fresh foods.  Eat more vegetables, fruits, and low-fat dairy products.  Choose whole grains. Look for the word "whole" as the first word in the ingredient list.  Choose fish and skinless chicken or [place] more often than red meat. Limit fish, poultry, and meat to 6 oz (170 g) each day.  Limit sweets, desserts, sugars, and sugary drinks.  Choose heart-healthy fats.  Limit cheese to 1 oz (28 g) per day.  Eat more home-cooked food and less restaurant, buffet, and fast food.  Limit fried foods.  Cook foods using  methods other than frying.  Limit canned vegetables. If you do use them, rinse them well to decrease the sodium.  When eating at a restaurant, ask that your food be prepared with less salt, or no salt if possible. WHAT FOODS CAN I EAT? Seek help from a dietitian for individual calorie needs. Grains Whole grain or whole wheat bread. Brown rice. Whole grain or whole wheat pasta. Quinoa, bulgur, and whole grain cereals. Low-sodium cereals. Corn or whole wheat flour tortillas. Whole grain cornbread. Whole grain crackers.  Low-sodium crackers. Vegetables Fresh or frozen vegetables (raw, steamed, roasted, or grilled). Low-sodium or reduced-sodium tomato and vegetable juices. Low-sodium or reduced-sodium tomato sauce and paste. Low-sodium or reduced-sodium canned vegetables.  Fruits All fresh, canned (in natural juice), or frozen fruits. Meat and Other Protein Products Ground beef (85% or leaner), grass-fed beef, or beef trimmed of fat. Skinless chicken or [place]. Ground chicken or [place]. Pork trimmed of fat. All fish and seafood. Eggs. Dried beans, peas, or lentils. Unsalted nuts and seeds. Unsalted canned beans. Dairy Low-fat dairy products, such as skim or 1% milk, 2% or reduced-fat cheeses, low-fat ricotta or cottage cheese, or plain low-fat yogurt. Low-sodium or reduced-sodium cheeses. Fats and Oils Tub margarines without trans fats. Light or reduced-fat mayonnaise and salad dressings (reduced sodium). Avocado. Safflower, olive, or canola oils. Natural peanut or almond butter. Other Unsalted popcorn and pretzels. The items listed above may not be a complete list of recommended foods or beverages. Contact your dietitian for more options. WHAT FOODS ARE NOT RECOMMENDED? Grains White bread. White pasta. White rice. Refined cornbread. Bagels and croissants. Crackers that contain trans fat. Vegetables Creamed or fried vegetables. Vegetables in a cheese sauce. Regular canned vegetables. Regular canned tomato sauce and paste. Regular tomato and vegetable juices. Fruits Dried fruits. Canned fruit in light or heavy syrup. Fruit juice. Meat and Other Protein Products Fatty cuts of meat. Ribs, chicken wings, bacon, sausage, bologna, salami, chitterlings, fatback, hot dogs, bratwurst, and packaged luncheon meats. Salted nuts and seeds. Canned beans with salt. Dairy Whole or 2% milk, cream, half-and-half, and cream cheese. Whole-fat or sweetened yogurt. Full-fat cheeses or blue cheese. Nondairy creamers and whipped  toppings. Processed cheese, cheese spreads, or cheese curds. Condiments Onion and garlic salt, seasoned salt, table salt, and sea salt. Canned and packaged gravies. Worcestershire sauce. Tartar sauce. Barbecue sauce. Teriyaki sauce. Soy sauce, including reduced sodium. Steak sauce. Fish sauce. Oyster sauce. Cocktail sauce. Horseradish. Ketchup and mustard. Meat flavorings and tenderizers. Bouillon cubes. Hot sauce. Tabasco sauce. Marinades. Taco seasonings. Relishes. Fats and Oils Butter, stick margarine, lard, shortening, ghee, and bacon fat. Coconut, palm kernel, or palm oils. Regular salad dressings. Other Pickles and olives. Salted popcorn and pretzels. The items listed above may not be a complete list of foods and beverages to avoid. Contact your dietitian for more information. WHERE CAN I FIND MORE INFORMATION? National Heart, Lung, and Blood Institute: travelstabloid.com Document Released: 10/30/2011 Document Revised: 03/27/2014 Document Reviewed: 09/14/2013 [facility] Patient Information 2015 [place], [place]. This information is not intended to replace advice given to you by your health care provider. Make sure you discuss any questions you have with your health care provider.   If I told you I had a single pill that would help you with everything listed below and more, would you be interested? . decrease stress by improving anxiety and depression . help you achieve a healthy weight . give you more energy . make you more productive . help you focus .  decrease your risk of dementia/heart attack/stroke/falls . improve your bone health  These are just some of the benefits that exercise brings to you.   IT IS WORTH carving out some time every day to fit in exercise. It will help in every aspect of your health. Even if you have injuries that prevent you from participating in a type of exercise you used to do; there is always something that you can do to keep  exercise a part of your life. If improving your health is important, make exercise your priority. It is worth the time! If you have questions about the type of exercise that is right for you, please talk with me about this!  EXERCISE IS MEDICINE!  Benefits of Exercise  Reduces breast cancer onset and recurrence by 50% Lowers risk of colon cancer by 66% Reduces the risk of Alzheimer's by almost 50% Reduces heart disease and high blood pressure by almost 50% Lowers risk of stroke by 33% Lowers risk of Type II Diabetes Mellitus by over 60% Treats depression as well as medication or cognitive behavioral therapy       Exercising to Stay Healthy  Exercising regularly is important. It has many health benefits, such as:  Improving your overall fitness, flexibility, and endurance.  Increasing your bone density.  Helping with weight control.  Decreasing your body fat.  Increasing your muscle strength.  Reducing stress and tension.  Improving your overall health.   In order to become healthy and stay healthy, it is recommended that you do moderate-intensity and vigorous-intensity exercise. You can tell that you are exercising at a moderate intensity if you have a higher heart rate and faster breathing, but you are still able to hold a conversation. You can tell that you are exercising at a vigorous intensity if you are breathing much harder and faster and cannot hold a conversation while exercising. How often should I exercise? Choose an activity that you enjoy and set realistic goals. Your health care provider can help you to make an activity plan that works for you. Exercise regularly as directed by your health care provider. This may include:  Doing resistance training twice each week, such as: ? Push-ups. ? Sit-ups. ? Lifting weights. ? Using resistance bands.  Doing a given intensity of exercise for a given amount of time. Choose from these options: ? 150 minutes of  moderate-intensity exercise every week. ? 75 minutes of vigorous-intensity exercise every week. ? A mix of moderate-intensity and vigorous-intensity exercise every week.   Children, pregnant women, people who are out of shape, people who are overweight, and older adults may need to consult a health care provider for individual recommendations. If you have any sort of medical condition, be sure to consult your health care provider before starting a new exercise program. What are some exercise ideas? Some moderate-intensity exercise ideas include:  Walking at a rate of 1 mile in 15 minutes.  Biking.  Hiking.  Golfing.  Dancing.   Some vigorous-intensity exercise ideas include:  Walking at a rate of at least 4.5 miles per hour.  Jogging or running at a rate of 5 miles per hour.  Biking at a rate of at least 10 miles per hour.  Lap swimming.  Roller-skating or in-line skating.  Cross-country skiing.  Vigorous competitive sports, such as football, basketball, and soccer.  Jumping rope.  Aerobic dancing.   What are some everyday activities that can help me to get exercise?  [place] work, such as: ?  Pushing a Conservation officer, nature. ? Raking and bagging leaves.  Washing and waxing your car.  Pushing a stroller.  Shoveling snow.  Gardening.  Washing windows or floors. How can I be more active in my day-to-day activities?  Use the stairs instead of the elevator.  Take a walk during your lunch break.  If you drive, park your car farther away from work or school.  If you take public transportation, get off one stop early and walk the rest of the way.  Make all of your phone calls while standing up and walking around.  Get up, stretch, and walk around every 30 minutes throughout the day. What guidelines should I follow while exercising?  Do not exercise so much that you hurt yourself, feel dizzy, or get very short of breath.  Consult your health care provider before  starting a new exercise program.  Wear comfortable clothes and shoes with good support.  Drink plenty of water while you exercise to prevent dehydration or heat stroke. Body water is lost during exercise and must be replaced.  Work out until you breathe faster and your heart beats faster. This information is not intended to replace advice given to you by your health care provider. Make sure you discuss any questions you have with your health care provider.

## 2019-12-23 LAB — COMPLETE METABOLIC PANEL WITH GFR
AG Ratio: 1.1 (calc) (ref 1.0–2.5)
ALT: 23 U/L (ref 6–29)
AST: 17 U/L (ref 10–35)
Albumin: 3.9 g/dL (ref 3.6–5.1)
Alkaline phosphatase (APISO): 63 U/L (ref 37–153)
BUN: 18 mg/dL (ref 7–25)
CO2: 29 mmol/L (ref 20–32)
Calcium: 9.6 mg/dL (ref 8.6–10.4)
Chloride: 102 mmol/L (ref 98–110)
Creat: 0.77 mg/dL (ref 0.50–1.05)
GFR, Est African American: 104 mL/min/{1.73_m2} (ref >=60)
GFR, Est Non African American: 90 mL/min/{1.73_m2} (ref >=60)
Globulin: 3.5 g/dL (calc) (ref 1.9–3.7)
Glucose, Bld: 87 mg/dL (ref 65–99)
Potassium: 4.2 mmol/L (ref 3.5–5.3)
Sodium: 136 mmol/L (ref 135–146)
Total Bilirubin: 0.3 mg/dL (ref 0.2–1.2)
Total Protein: 7.4 g/dL (ref 6.1–8.1)

## 2019-12-23 LAB — CBC WITH DIFFERENTIAL/PLATELET
Absolute Monocytes: 640 cells/uL (ref 200–950)
Basophils Absolute: 57 cells/uL (ref 0–200)
Basophils Relative: 0.7 %
Eosinophils Absolute: 139 cells/uL (ref 15–500)
Eosinophils Relative: 1.7 %
HCT: 46.7 % — ABNORMAL HIGH (ref 35.0–45.0)
Hemoglobin: 15.5 g/dL (ref 11.7–15.5)
Lymphs Abs: 3682 cells/uL (ref 850–3900)
MCH: 27.4 pg (ref 27.0–33.0)
MCHC: 33.2 g/dL (ref 32.0–36.0)
MCV: 82.7 fL (ref 80.0–100.0)
MPV: 12.8 fL — ABNORMAL HIGH (ref 7.5–12.5)
Monocytes Relative: 7.8 %
Neutro Abs: 3682 cells/uL (ref 1500–7800)
Neutrophils Relative %: 44.9 %
Platelets: 257 10*3/uL (ref 140–400)
RBC: 5.65 10*6/uL — ABNORMAL HIGH (ref 3.80–5.10)
RDW: 13.3 % (ref 11.0–15.0)
Total Lymphocyte: 44.9 %
WBC: 8.2 10*3/uL (ref 3.8–10.8)

## 2019-12-23 LAB — HEMOGLOBIN A1C
Hgb A1c MFr Bld: 6.7 % of total Hgb — ABNORMAL HIGH (ref <5.7)
Mean Plasma Glucose: 146 (calc)
eAG (mmol/L): 8.1 (calc)

## 2019-12-23 LAB — LIPID PANEL
Cholesterol: 214 mg/dL — ABNORMAL HIGH (ref <200)
HDL: 53 mg/dL (ref >=50)
LDL Cholesterol (Calc): 140 mg/dL (calc) — ABNORMAL HIGH
Non-HDL Cholesterol (Calc): 161 mg/dL (calc) — ABNORMAL HIGH (ref <130)
Total CHOL/HDL Ratio: 4 (calc) (ref <5.0)
Triglycerides: 97 mg/dL (ref <150)

## 2019-12-23 LAB — TSH: TSH: 1.9 mIU/L

## 2020-02-18 ENCOUNTER — Ambulatory Visit: Payer: Self-pay | Attending: Internal Medicine

## 2020-02-18 DIAGNOSIS — Z23 Encounter for immunization: Secondary | ICD-10-CM

## 2020-02-18 NOTE — Progress Notes (Signed)
   Covid-19 Vaccination Clinic  Name:  Hawa Dicecco    MRN: [id number] DOB: 08-23-1971  02/18/2020  Ms. Brauner was observed post Covid-19 immunization for 15 minutes without incident. She was provided with Vaccine Information Sheet and instruction to access the V-Safe system.   Ms. Dec was instructed to call 911 with any severe reactions post vaccine: Marland Kitchen Difficulty breathing  . Swelling of face and throat  . A fast heartbeat  . A bad rash all over body  . Dizziness and weakness   Immunizations Administered    Name Date Dose VIS Date Route   Pfizer COVID-19 Vaccine 02/18/2020  9:48 AM 0.3 mL 11/04/2019 Intramuscular   Manufacturer: [place]   Lot: [id number]   [place]: [id number]

## 2020-02-23 MED ORDER — HYDROCHLOROTHIAZIDE 12.5 MG PO CAPS: ORAL_CAPSULE | ORAL | 1 refills | Status: DC

## 2020-03-05 ENCOUNTER — Other Ambulatory Visit: Payer: Self-pay

## 2020-03-05 MED ORDER — HYDROCHLOROTHIAZIDE 12.5 MG PO CAPS: ORAL_CAPSULE | ORAL | 1 refills | Status: DC

## 2020-03-13 ENCOUNTER — Ambulatory Visit: Payer: Self-pay | Attending: Internal Medicine

## 2020-03-13 DIAGNOSIS — Z23 Encounter for immunization: Secondary | ICD-10-CM

## 2020-03-13 NOTE — Progress Notes (Signed)
   Covid-19 Vaccination Clinic  Name:  Sarah Warren    MRN: [id number] DOB: 1971/11/13  03/13/2020  [name] was observed post Covid-19 immunization for 15 minutes without incident. She was provided with Vaccine Information Sheet and instruction to access the V-Safe system.   [name] was instructed to call 911 with any severe reactions post vaccine: Marland Kitchen Difficulty breathing  . Swelling of face and throat  . A fast heartbeat  . A bad rash all over body  . Dizziness and weakness   Immunizations Administered    Name Date Dose VIS Date Route   Pfizer COVID-19 Vaccine 03/13/2020  4:21 PM 0.3 mL 01/18/2019 Intramuscular   Manufacturer: [place]   Lot: [id number]   [place]: [id number]

## 2020-03-29 NOTE — Progress Notes (Signed)
Complete Physical  Assessment and Plan:  Encounter for general adult medical examination with abnormal findings 1 year  BMI 34 - follow up 1 month for progress monitoring - increase veggies, decrease carbs - long discussion about weight loss, diet, and exercise  Benign labile hypertension -     EKG 12-Lead - continue medications, DASH diet, exercise and monitor at home. Call if greater than 130/80.   Screen for colon cancer -     Ambulatory referral to Gastroenterology No symptoms at this time  Hyperlipidemia associated with type 2 diabetes mellitus ([place]) -     TSH -     Lipid panel -     Hemoglobin A1c -     EKG 12-Lead -     empagliflozin (JARDIANCE) 25 MG TABS tablet; Take 25 mg by mouth daily. Will stop fluid pill, given samples of jardiance with information, follow up 4-6 week for labs and office visit- if any yeast infection can try ryeblsus.  Discussed general issues about diabetes pathophysiology and management., Educational material distributed., Suggested low cholesterol diet., Encouraged aerobic exercise., Discussed foot care., Reminded to get yearly retinal exam.  Type 2 diabetes mellitus with hyperlipidemia (HCC) -     Hemoglobin A1c -     empagliflozin (JARDIANCE) 25 MG TABS tablet; Take 25 mg by mouth daily. Discussed general issues about diabetes pathophysiology and management., Educational material distributed., Suggested low cholesterol diet., Encouraged aerobic exercise., Discussed foot care., Reminded to get yearly retinal exam.  Screening for hematuria or proteinuria -     Urinalysis, Routine w reflex microscopic -     Microalbumin / creatinine urine ratio  Medication management -     CBC with Differential/Platelet -     COMPLETE METABOLIC PANEL WITH GFR -     Magnesium  Vitamin D deficiency -     VITAMIN D 25 Hydroxy (Vit-D Deficiency, Fractures)  B12 deficiency Continue b12  Uterine leiomyoma, unspecified location Continue follow up  GYN  Uncomplicated asthma, unspecified asthma severity, unspecified whether persistent Monitor  Hair loss -     Iron,Total/Total Iron Binding Cap -     Ferritin -     Folate RBC -     Zinc Likely from telogen effluvium with COVID in 07/2019 and hair loss starting in 10/2019- will check labs, continue b12, try to cut back on stress.     Discussed med's effects and SE's. Screening labs and tests as requested with regular follow-up as recommended. Over 40 minutes of exam, counseling, chart review and critical decision making was performed  HPI  This very nice 49 y.o. AA female presents for complete physical.    She does not workout, denies CP/SOB. BP: 122/76 She is on HCTZ 12.5mg  a day, she would like to get off of that.   She has a history of childhood asthma that is well controlled, SHE DENIES ANY SOB OR DYSPNEA WITH EXERTION.  Finally, patient has history of Vitamin D Deficiency, she is not on anything at thist ime.  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   BMI is Body mass index is 34.56 kg/m., she is working on diet and exercise. She was doing weight loss classes but it not anymore and has gained the majority of her weight back.  Wt Readings from Last 3 Encounters:  04/02/20 237 lb 6.4 oz (107.7 kg)  12/22/19 237 lb 3.2 oz (  107.6 kg)  10/27/19 232 lb (105.2 kg)   Due to her obesity she has diabetes since her last visit.  With hyperlipidemia She is not checking her sugars. She is starting healthy eating/weight loss- her son wants to drop weight for football.   She denies diabetic polys.  Lab Results  Component Value Date   HGBA1C 6.7 (H) 12/22/2019   She has hyperlipidemia but is not on medication. Her last cholesterol was Lab Results  Component Value Date   CHOL 214 (H) 12/22/2019   HDL 53 12/22/2019   LDLCALC 140 (H) 12/22/2019   TRIG 97 12/22/2019   CHOLHDL 4.0 12/22/2019     Current  Medications:  Current Outpatient Medications on File Prior to Visit  Medication Sig Dispense Refill  . Cyanocobalamin (VITAMIN B12 PO) Take by mouth daily. Takes 3-4 tablets twice a week    . GILDESS 1/20 1-20 MG-MCG tablet TAKE ONE TABLET BY MOUTH ONE TIME DAILY. NO REFILLS UNTIL OFFICE VISIT.  0  . Latanoprost 0.005 % EMUL Place 0.005 drops into both eyes at bedtime.    . Multiple Vitamin (MULTIVITAMIN) tablet Take 1 tablet by mouth daily.    . timolol (TIMOPTIC) 0.5 % ophthalmic solution Place 0.5 drops into both eyes 2 (two) times daily.    . Vitamin D, Ergocalciferol, (DRISDOL) 1.25 MG (50000 UT) CAPS capsule 1 pill twice a week for 8 week for severe def 16 capsule 0   No current facility-administered medications on file prior to visit.   Health Maintenance:   Immunization History  Administered Date(s) Administered  . Influenza Inj Mdck Quad With Preservative 09/30/2019  . Influenza-Unspecified 09/13/2015  . PFIZER SARS-COV-2 Vaccination 02/18/2020, 03/13/2020  . PPD Test 04/25/2014  . Tdap 04/25/2014, 09/13/2015   Works at [employer], she is in the [employer]. Worked there 10 years, works 8-5   TD/TDAP: 2015 Influenza: did not get this year PPD 2015 Pneumovax: N/A Prevnar 13: N/A  LMP: Patient's last menstrual period was 03/17/2020. Pap: 08/2019- [facility] , normal pap MGM: 07/2019 at work, mobile comes around Colonoscopy: DUE Last Dental Exam:  q 6 months  Last Eye Exam: Dr. Dimple Nanas My eye doctor, 2020, glasses Dr. Nancy Fetter retinal specialist for glaucoma.   Medical History:  Past Medical History:  Diagnosis Date  . Ankle swelling   . Asthma    childhood  . Glaucoma   . Uterine fibroid    Allergies No Known Allergies  SURGICAL HISTORY She  has a past surgical history that includes Wisdom tooth extraction. FAMILY HISTORY Her family history includes Diabetes in her father, maternal grandmother, and mother; Hypertension in her father and  mother. SOCIAL HISTORY She  reports that she has never smoked. She has never used smokeless tobacco. She reports current alcohol use. She reports that she does not use drugs. 4 x a month  Review of Systems: Review of Systems  Constitutional: Negative.   HENT: Negative.   Eyes: Negative.   Respiratory: Negative.   Cardiovascular: Negative.   Gastrointestinal: Negative.   Genitourinary: Negative.   Musculoskeletal: Negative.   Skin: Negative for itching and rash.  Neurological: Negative.   Endo/Heme/Allergies: Negative.   Psychiatric/Behavioral: Negative.     Physical Exam: Estimated body mass index is 34.56 kg/m as calculated from the following:   Height as of this encounter: 5' 9.5" (1.765 m).   Weight as of this encounter: 237 lb 6.4 oz (107.7 kg). BP 122/76   Pulse 74   Temp (!) 97.5  F (36.4 C)   Ht 5' 9.5" (1.765 m)   Wt 237 lb 6.4 oz (107.7 kg)   LMP 03/17/2020   SpO2 99%   BMI 34.56 kg/m  General Appearance: Well nourished, in no apparent distress.  Eyes: PERRLA, EOMs, conjunctiva no swelling or erythema, normal fundi and vessels.  Sinuses: No Frontal/maxillary tenderness  ENT/Mouth: Ext aud canals clear, normal light reflex with TMs without erythema, bulging. Good dentition. No erythema, swelling, or exudate on post pharynx. Tonsils not swollen or erythematous. Hearing normal.  Neck: Supple, thyroid normal. No bruits  Respiratory: Respiratory effort normal, BS equal bilaterally without rales, rhonchi, wheezing or stridor.  Cardio: RRR without murmurs, rubs or gallops. Brisk peripheral pulses without edema.  Chest: symmetric, with normal excursions and percussion.  Breasts: defer Abdomen: Soft, nontender, + fixed enlarged uterus, nontender, rebound, hernias, masses, or organomegaly.  Lymphatics: Non tender without lymphadenopathy.  Genitourinary: defer Musculoskeletal: Full ROM all peripheral extremities,5/5 strength, and normal gait.  Skin: Acne on nose. Warm,  dry without rashes, lesions, ecchymosis. Neuro: Cranial nerves intact, reflexes equal bilaterally. Normal muscle tone, no cerebellar symptoms. Sensation intact.  Psych: Awake and oriented X 3, normal affect, Insight and Judgment appropriate.   EKG: WNL, no ST changes  Vicie Mutters 10:00 AM [facility]

## 2020-04-02 ENCOUNTER — Other Ambulatory Visit: Payer: Self-pay

## 2020-04-02 ENCOUNTER — Encounter: Payer: Self-pay | Admitting: Physician Assistant

## 2020-04-02 ENCOUNTER — Ambulatory Visit (INDEPENDENT_AMBULATORY_CARE_PROVIDER_SITE_OTHER): Payer: Managed Care, Other (non HMO) | Admitting: Physician Assistant

## 2020-04-02 VITALS — BP 122/76 | HR 74 | Temp 97.5°F | Ht 69.5 in | Wt 237.4 lb

## 2020-04-02 DIAGNOSIS — J45909 Unspecified asthma, uncomplicated: Secondary | ICD-10-CM

## 2020-04-02 DIAGNOSIS — Z79899 Other long term (current) drug therapy: Secondary | ICD-10-CM

## 2020-04-02 DIAGNOSIS — Z0000 Encounter for general adult medical examination without abnormal findings: Secondary | ICD-10-CM | POA: Diagnosis not present

## 2020-04-02 DIAGNOSIS — E1169 Type 2 diabetes mellitus with other specified complication: Secondary | ICD-10-CM | POA: Insufficient documentation

## 2020-04-02 DIAGNOSIS — L659 Nonscarring hair loss, unspecified: Secondary | ICD-10-CM

## 2020-04-02 DIAGNOSIS — Z1211 Encounter for screening for malignant neoplasm of colon: Secondary | ICD-10-CM

## 2020-04-02 DIAGNOSIS — I10 Essential (primary) hypertension: Secondary | ICD-10-CM

## 2020-04-02 DIAGNOSIS — Z1389 Encounter for screening for other disorder: Secondary | ICD-10-CM | POA: Diagnosis not present

## 2020-04-02 DIAGNOSIS — R7309 Other abnormal glucose: Secondary | ICD-10-CM

## 2020-04-02 DIAGNOSIS — Z130 Encounter for screening for diseases of the blood and blood-forming organs and certain disorders involving the immune mechanism: Secondary | ICD-10-CM | POA: Diagnosis not present

## 2020-04-02 DIAGNOSIS — Z0001 Encounter for general adult medical examination with abnormal findings: Secondary | ICD-10-CM

## 2020-04-02 DIAGNOSIS — Z13220 Encounter for screening for lipoid disorders: Secondary | ICD-10-CM

## 2020-04-02 DIAGNOSIS — Z131 Encounter for screening for diabetes mellitus: Secondary | ICD-10-CM | POA: Diagnosis not present

## 2020-04-02 DIAGNOSIS — Z6834 Body mass index (BMI) 34.0-34.9, adult: Secondary | ICD-10-CM

## 2020-04-02 DIAGNOSIS — D259 Leiomyoma of uterus, unspecified: Secondary | ICD-10-CM

## 2020-04-02 DIAGNOSIS — E559 Vitamin D deficiency, unspecified: Secondary | ICD-10-CM

## 2020-04-02 DIAGNOSIS — E785 Hyperlipidemia, unspecified: Secondary | ICD-10-CM | POA: Insufficient documentation

## 2020-04-02 DIAGNOSIS — E538 Deficiency of other specified B group vitamins: Secondary | ICD-10-CM

## 2020-04-02 DIAGNOSIS — Z136 Encounter for screening for cardiovascular disorders: Secondary | ICD-10-CM

## 2020-04-02 MED ORDER — JARDIANCE 25 MG PO TABS: 25.0000 mg | ORAL_TABLET | Freq: Every day | ORAL | 0 refills | Status: DC

## 2020-04-02 NOTE — Patient Instructions (Addendum)
telogen effluvium- hair loss- but we will check labs  STARTING A DIABETIC MEDICATION  The medication we are going to start you on is a sodium-glucose cotransporter-2 (SGLT2) inhibitors for your diabetes and weight.   This can decrease your blood pressure so please contact us if you have any dizziness, sometime we need to decrease or stop fluid pills or decrease BP meds.  You are peeing out 300-400 calories a day of sugar which can lead to yeast infections, you can take the diflucan as needed but if the infections continue we will stop the medications.  If you get any pain or discomfort around your buttocks or genitals please let us know if it does not go away. We will have you follow up in 1 month to check your kidney function and weight. Call if you need anything.   Diabetes is a very complicated disease...lets simplify it.  An easy way to look at it to understand the complications is if you think of the extra sugar floating in your blood stream as glass shards floating through your blood stream.    Diabetes affects your small vessels first: 1) The glass shards (sugar) scraps down the tiny blood vessels in your eyes and lead to diabetic retinopathy, the leading cause of blindness in the [place]. Diabetes is the leading cause of newly diagnosed adult (45 to 50 years of age) blindness in the [place].  2) The glass shards scratches down the tiny vessels of your legs leading to nerve damage called neuropathy and can lead to amputations of your feet. More than 60% of all non-traumatic amputations of lower limbs occur in people with diabetes.  3) Over time the small vessels in your brain are shredded and closed off, individually this does not cause any problems but over a long period of time many of the small vessels being blocked can lead to Vascular Dementia.   4) Your kidney's are a filter system and have a "net" that keeps certain things in the body and lets bad things out. Sugar shreds this  net and leads to kidney damage and eventually failure. Decreasing the sugar that is destroying the net and certain blood pressure medications can help stop or decrease progression of kidney disease. Diabetes was the primary cause of kidney failure in 44 percent of all new cases in 2011.  5) Diabetes also destroys the small vessels in your penis that lead to erectile dysfunction. Eventually the vessels are so damaged that you may not be responsive to cialis or viagra.   Diabetes and your large vessels: Your larger vessels consist of your coronary arteries in your heart and the carotid vessels to your brain. Diabetes or even increased sugars put you at 300% increased risk of heart attack and stroke and this is why.. The sugar scrapes down your large blood vessels and your body sees this as an internal injury and tries to repair itself. Just like you get a scab on your skin, your platelets will stick to the blood vessel wall trying to heal it. This is why we have diabetics on low dose aspirin daily, this prevents the platelets from sticking and can prevent plaque formation. In addition, your body takes cholesterol and tries to shove it into the open wound. This is why we want your LDL, or bad cholesterol, below 70.   The combination of platelets and cholesterol over 5-10 years forms plaque that can break off and cause a heart attack or stroke.   PLEASE REMEMBER:  Diabetes is preventable! Up to 56 percent of complications and morbidities among individuals with type 2 diabetes can be prevented, delayed, or effectively treated and minimized with regular visits to a health professional, appropriate monitoring and medication, and a healthy diet and lifestyle.  RANGE OF A1C   Your A1C is a measure of your sugar over the past 3 months and is not affected by what you have eaten over the past few days. Diabetes increases your chances of stroke and heart attack over 300 % and is the leading cause of blindness and  kidney failure in the [place]. Please make sure you decrease bad carbs like white bread, white rice, potatoes, corn, soft drinks, pasta, cereals, refined sugars, sweet tea, dried fruits, and fruit juice. Good carbs are okay to eat in moderation like sweet potatoes, brown rice, whole grain pasta/bread, most fruit (except dried fruit) and you can eat as many veggies as you want.   Greater than 6.5 is considered diabetic. Between 6.4 and 5.7 is prediabetic If your A1C is less than 5.7 you are NOT diabetic.  Targets for Glucose Readings: Time of Check Target for patients WITHOUT Diabetes Target for DIABETICS  Before Meals Less than 100  less than 150  Two hours after meals Less than 200  Less than 250     Check out  Mini habits for weight loss book  2 free apps for tracking food is myfitness pal  loseit  If you want more structured weight loss that you have to pay for, you can look into  [employer]  weight watchers  General eating tips  What to Avoid . Avoid added sugars o Often added sugar can be found in processed foods such as many condiments, dry cereals, cakes, cookies, chips, crisps, crackers, candies, sweetened drinks, etc.  o Read labels and AVOID/DECREASE use of foods with the following in their ingredient list: Sugar, fructose, high fructose corn syrup, sucrose, glucose, maltose, dextrose, molasses, cane sugar, brown sugar, any type of syrup, agave nectar, etc.   . Avoid snacking in between meals- drink water or if you feel you need a snack, pick a high water content snack such as cucumbers, watermelon, or any veggie.  Marland Kitchen Avoid foods made with flour o If you are going to eat food made with flour, choose those made with whole-grains; and, minimize your consumption as much as is tolerable . Avoid processed foods o These foods are generally stocked in the middle of the grocery store.  o Focus on shopping on the perimeter of the grocery.  What to Include . Vegetables o GREEN  LEAFY VEGETABLES: Kale, spinach, mustard greens, collard greens, cabbage, broccoli, etc. o OTHER: Asparagus, cauliflower, eggplant, carrots, peas, Brussel sprouts, tomatoes, bell peppers, zucchini, beets, cucumbers, etc. . Grains, seeds, and legumes o Beans: kidney beans, black eyed peas, garbanzo beans, black beans, pinto beans, etc. o Whole, unrefined grains: brown rice, barley, bulgur, oatmeal, etc. . Healthy fats  o Avoid highly processed fats such as vegetable oil o Examples of healthy fats: avocado, olives, virgin olive oil, dark chocolate (?72% Cocoa), nuts (peanuts, almonds, walnuts, cashews, pecans, etc.) o Please still do small amount of these healthy fats, they are dense in calories.  . Low - Moderate Intake of Animal Sources of Protein o Meat sources: chicken, [place], salmon, tuna. Limit to 4 ounces of meat at one time or the size of your palm. o Consider limiting dairy sources, but when choosing dairy focus on: PLAIN [place] yogurt, cottage  cheese, high-protein milk . Fruit o Choose berries

## 2020-04-04 LAB — COMPLETE METABOLIC PANEL WITH GFR
AG Ratio: 1 (calc) (ref 1.0–2.5)
ALT: 17 U/L (ref 6–29)
AST: 15 U/L (ref 10–35)
Albumin: 3.7 g/dL (ref 3.6–5.1)
Alkaline phosphatase (APISO): 58 U/L (ref 37–153)
BUN: 12 mg/dL (ref 7–25)
CO2: 31 mmol/L (ref 20–32)
Calcium: 9.4 mg/dL (ref 8.6–10.4)
Chloride: 102 mmol/L (ref 98–110)
Creat: 0.82 mg/dL (ref 0.50–1.05)
GFR, Est African American: 97 mL/min/{1.73_m2} (ref >=60)
GFR, Est Non African American: 83 mL/min/{1.73_m2} (ref >=60)
Globulin: 3.7 g/dL (calc) (ref 1.9–3.7)
Glucose, Bld: 114 mg/dL — ABNORMAL HIGH (ref 65–99)
Potassium: 4.1 mmol/L (ref 3.5–5.3)
Sodium: 139 mmol/L (ref 135–146)
Total Bilirubin: 0.5 mg/dL (ref 0.2–1.2)
Total Protein: 7.4 g/dL (ref 6.1–8.1)

## 2020-04-04 LAB — CBC WITH DIFFERENTIAL/PLATELET
Absolute Monocytes: 462 cells/uL (ref 200–950)
Basophils Absolute: 41 cells/uL (ref 0–200)
Basophils Relative: 0.6 %
Eosinophils Absolute: 122 cells/uL (ref 15–500)
Eosinophils Relative: 1.8 %
HCT: 45.2 % — ABNORMAL HIGH (ref 35.0–45.0)
Hemoglobin: 14.9 g/dL (ref 11.7–15.5)
Lymphs Abs: 2978 cells/uL (ref 850–3900)
MCH: 27.7 pg (ref 27.0–33.0)
MCHC: 33 g/dL (ref 32.0–36.0)
MCV: 84.2 fL (ref 80.0–100.0)
MPV: 12.4 fL (ref 7.5–12.5)
Monocytes Relative: 6.8 %
Neutro Abs: 3196 cells/uL (ref 1500–7800)
Neutrophils Relative %: 47 %
Platelets: 274 10*3/uL (ref 140–400)
RBC: 5.37 10*6/uL — ABNORMAL HIGH (ref 3.80–5.10)
RDW: 12.8 % (ref 11.0–15.0)
Total Lymphocyte: 43.8 %
WBC: 6.8 10*3/uL (ref 3.8–10.8)

## 2020-04-04 LAB — MAGNESIUM: Magnesium: 2 mg/dL (ref 1.5–2.5)

## 2020-04-04 LAB — HEMOGLOBIN A1C
Hgb A1c MFr Bld: 6.4 % of total Hgb — ABNORMAL HIGH (ref <5.7)
Mean Plasma Glucose: 137 (calc)
eAG (mmol/L): 7.6 (calc)

## 2020-04-04 LAB — URINALYSIS, ROUTINE W REFLEX MICROSCOPIC
Bilirubin Urine: NEGATIVE
Glucose, UA: NEGATIVE
Hgb urine dipstick: NEGATIVE
Ketones, ur: NEGATIVE
Leukocytes,Ua: NEGATIVE
Nitrite: NEGATIVE
Protein, ur: NEGATIVE
Specific Gravity, Urine: 1.011 (ref 1.001–1.03)
pH: <=5 (ref 5.0–8.0)

## 2020-04-04 LAB — LIPID PANEL
Cholesterol: 198 mg/dL (ref <200)
HDL: 52 mg/dL (ref >=50)
LDL Cholesterol (Calc): 130 mg/dL (calc) — ABNORMAL HIGH
Non-HDL Cholesterol (Calc): 146 mg/dL (calc) — ABNORMAL HIGH (ref <130)
Total CHOL/HDL Ratio: 3.8 (calc) (ref <5.0)
Triglycerides: 64 mg/dL (ref <150)

## 2020-04-04 LAB — MICROALBUMIN / CREATININE URINE RATIO
Creatinine, Urine: 65 mg/dL (ref 20–275)
Microalb Creat Ratio: 5 mcg/mg creat (ref <30)
Microalb, Ur: 0.3 mg/dL

## 2020-04-04 LAB — FERRITIN: Ferritin: 19 ng/mL (ref 16–232)

## 2020-04-04 LAB — ZINC: Zinc: 71 ug/dL (ref 60–130)

## 2020-04-04 LAB — VITAMIN D 25 HYDROXY (VIT D DEFICIENCY, FRACTURES): Vit D, 25-Hydroxy: 110 ng/mL — ABNORMAL HIGH (ref 30–100)

## 2020-04-04 LAB — IRON, TOTAL/TOTAL IRON BINDING CAP
%SAT: 17 % (calc) (ref 16–45)
Iron: 56 ug/dL (ref 45–160)
TIBC: 336 mcg/dL (calc) (ref 250–450)

## 2020-04-04 LAB — TSH: TSH: 1.48 mIU/L

## 2020-04-04 LAB — FOLATE RBC: RBC Folate: 576 ng/mL RBC (ref >280)

## 2020-04-30 NOTE — Progress Notes (Signed)
Diabetes Education and Follow-Up Visit  49 y.o.female presents for diabetic education. She has Diabetes Mellitus type 2 and obesity.  She was started on jardiance last visit for weight loss, states she is tolerating it well, no yeast, no dizziness. She has lost 3 lbs. .   BP: 130/72   Hyperlipidemia not on medication.   BMI is Body mass index is 34.41 kg/m., she is working on diet and exercise. Wt Readings from Last 3 Encounters:  05/02/20 233 lb (105.7 kg)  04/02/20 237 lb 6.4 oz (107.7 kg)  12/22/19 237 lb 3.2 oz (107.6 kg)   Last hemoglobin A1c was: Lab Results  Component Value Date   HGBA1C 6.4 (H) 04/02/2020   HGBA1C 6.7 (H) 12/22/2019   HGBA1C 6.3 (H) 09/30/2019   Lab Results  Component Value Date   GFRAA 97 04/02/2020   Lab Results  Component Value Date   CHOL 198 04/02/2020   HDL 52 04/02/2020   LDLCALC 130 (H) 04/02/2020   TRIG 64 04/02/2020   CHOLHDL 3.8 04/02/2020     Problem List has Uncomplicated asthma; Hyperlipidemia LDL goal <130; Obesity; Vitamin D deficiency; B12 deficiency; Uterine leiomyoma; Abnormal glucose; Medication management; Benign labile hypertension; and Type 2 diabetes mellitus with hyperlipidemia (HCC) on their problem list.  Medications Current Outpatient Medications on File Prior to Visit  Medication Sig  . Cyanocobalamin (VITAMIN B12 PO) Take by mouth daily. Takes 3-4 tablets twice a week  . empagliflozin (JARDIANCE) 25 MG TABS tablet Take 25 mg by mouth daily.  Marland Kitchen GILDESS 1/20 1-20 MG-MCG tablet TAKE ONE TABLET BY MOUTH ONE TIME DAILY. NO REFILLS UNTIL OFFICE VISIT.  Marland Kitchen Latanoprost 0.005 % EMUL Place 0.005 drops into both eyes at bedtime.  . Multiple Vitamin (MULTIVITAMIN) tablet Take 1 tablet by mouth daily.  . timolol (TIMOPTIC) 0.5 % ophthalmic solution Place 0.5 drops into both eyes 2 (two) times daily.  . Vitamin D, Ergocalciferol, (DRISDOL) 1.25 MG (50000 UT) CAPS capsule 1 pill twice a week for 8 week for severe def   No  current facility-administered medications on file prior to visit.    ROS- see HPI  Physical Exam: Blood pressure 130/72, pulse (!) 56, temperature (!) 97.3 F (36.3 C), height 5\' 9"  (1.753 m), weight 233 lb (105.7 kg), SpO2 96 %. Body mass index is 34.41 kg/m. General Appearance: Well nourished, in no apparent distress. Eyes: PERRLA, EOMs, conjunctiva no swelling or erythema ENT/Mouth: Ext aud canals clear, TMs without erythema, bulging. No erythema, swelling, or exudate on post pharynx.  Tonsils not swollen or erythematous. Hearing normal.  Respiratory: Respiratory effort normal, BS equal bilaterally without rales, rhonchi, wheezing or stridor.  Cardio: RRR with no MRGs. Brisk peripheral pulses without edema.  Abdomen: Soft, + BS.  Non tender, no guarding, rebound, hernias, masses. Musculoskeletal: Full ROM, 5/5 strength, normal gait.  Skin: Warm, dry without rashes, lesions, ecchymosis.  Neuro: Cranial nerves intact. Normal muscle tone, no cerebellar symptoms. Sensation intact.    Plan and Assessment: Diabetes Education: Reviewed 'ABCs' of diabetes management (respective goals in parentheses):  A1C (<7), blood pressure (<130/80), and cholesterol (LDL <70) Eye Exam yearly and Dental Exam every 6 months. Dietary recommendations Physical Activity recommendations - continue jardiance and follow up in 2 months - given foot care handout and explained the principles  - given instructions for hypoglycemia management    Future Appointments  Date Time Provider [place]  04/02/2021  9:00 AM Vicie Mutters, PA-C [facility] None

## 2020-05-02 ENCOUNTER — Encounter: Payer: Self-pay | Admitting: Physician Assistant

## 2020-05-02 ENCOUNTER — Other Ambulatory Visit: Payer: Self-pay

## 2020-05-02 ENCOUNTER — Ambulatory Visit (INDEPENDENT_AMBULATORY_CARE_PROVIDER_SITE_OTHER): Payer: Managed Care, Other (non HMO) | Admitting: Physician Assistant

## 2020-05-02 VITALS — BP 130/72 | HR 56 | Temp 97.3°F | Ht 69.0 in | Wt 233.0 lb

## 2020-05-02 DIAGNOSIS — Z79899 Other long term (current) drug therapy: Secondary | ICD-10-CM | POA: Diagnosis not present

## 2020-05-02 DIAGNOSIS — E785 Hyperlipidemia, unspecified: Secondary | ICD-10-CM

## 2020-05-02 DIAGNOSIS — E1169 Type 2 diabetes mellitus with other specified complication: Secondary | ICD-10-CM | POA: Diagnosis not present

## 2020-05-02 MED ORDER — EMPAGLIFLOZIN 25 MG PO TABS: 25.0000 mg | ORAL_TABLET | Freq: Every day | ORAL | 0 refills | Status: DC

## 2020-05-02 NOTE — Patient Instructions (Addendum)
Call GI to set up Phone: [phone];  Continue jardiance for now Follow up 2 months  Go back to the better breakfast but try [employer]  Keep food diary and bring with you  General eating tips  What to Avoid . Avoid added sugars o Often added sugar can be found in processed foods such as many condiments, dry cereals, cakes, cookies, chips, crisps, crackers, candies, sweetened drinks, etc.  o Read labels and AVOID/DECREASE use of foods with the following in their ingredient list: Sugar, fructose, high fructose corn syrup, sucrose, glucose, maltose, dextrose, molasses, cane sugar, brown sugar, any type of syrup, agave nectar, etc.   . Avoid snacking in between meals- drink water or if you feel you need a snack, pick a high water content snack such as cucumbers, watermelon, or any veggie.  Marland Kitchen Avoid foods made with flour o If you are going to eat food made with flour, choose those made with whole-grains; and, minimize your consumption as much as is tolerable . Avoid processed foods o These foods are generally stocked in the middle of the grocery store.  o Focus on shopping on the perimeter of the grocery.  What to Include . Vegetables o GREEN LEAFY VEGETABLES: Kale, spinach, mustard greens, collard greens, cabbage, broccoli, etc. o OTHER: Asparagus, cauliflower, eggplant, carrots, peas, Brussel sprouts, tomatoes, bell peppers, zucchini, beets, cucumbers, etc. . Grains, seeds, and legumes o Beans: kidney beans, black eyed peas, garbanzo beans, black beans, pinto beans, etc. o Whole, unrefined grains: brown rice, barley, bulgur, oatmeal, etc. . Healthy fats  o Avoid highly processed fats such as vegetable oil o Examples of healthy fats: avocado, olives, virgin olive oil, dark chocolate (?72% Cocoa), nuts (peanuts, almonds, walnuts, cashews, pecans, etc.) o Please still do small amount of these healthy fats, they are dense in calories.  . Low - Moderate Intake of Animal Sources of  Protein o Meat sources: chicken, [place], salmon, tuna. Limit to 4 ounces of meat at one time or the size of your palm. o Consider limiting dairy sources, but when choosing dairy focus on: PLAIN [place] yogurt, cottage cheese, high-protein milk . Fruit o Choose berries

## 2020-05-03 ENCOUNTER — Encounter: Payer: Self-pay | Admitting: Gastroenterology

## 2020-05-03 LAB — COMPLETE METABOLIC PANEL WITH GFR
AG Ratio: 1.1 (calc) (ref 1.0–2.5)
ALT: 16 U/L (ref 6–29)
AST: 16 U/L (ref 10–35)
Albumin: 3.9 g/dL (ref 3.6–5.1)
Alkaline phosphatase (APISO): 55 U/L (ref 37–153)
BUN: 14 mg/dL (ref 7–25)
CO2: 28 mmol/L (ref 20–32)
Calcium: 9.3 mg/dL (ref 8.6–10.4)
Chloride: 105 mmol/L (ref 98–110)
Creat: 0.79 mg/dL (ref 0.50–1.05)
GFR, Est African American: 101 mL/min/{1.73_m2} (ref >=60)
GFR, Est Non African American: 87 mL/min/{1.73_m2} (ref >=60)
Globulin: 3.6 g/dL (calc) (ref 1.9–3.7)
Glucose, Bld: 83 mg/dL (ref 65–99)
Potassium: 4.4 mmol/L (ref 3.5–5.3)
Sodium: 138 mmol/L (ref 135–146)
Total Bilirubin: 0.6 mg/dL (ref 0.2–1.2)
Total Protein: 7.5 g/dL (ref 6.1–8.1)

## 2020-05-03 LAB — CBC WITH DIFFERENTIAL/PLATELET
Absolute Monocytes: 409 cells/uL (ref 200–950)
Basophils Absolute: 37 cells/uL (ref 0–200)
Basophils Relative: 0.6 %
Eosinophils Absolute: 130 cells/uL (ref 15–500)
Eosinophils Relative: 2.1 %
HCT: 45.3 % — ABNORMAL HIGH (ref 35.0–45.0)
Hemoglobin: 14.8 g/dL (ref 11.7–15.5)
Lymphs Abs: 2864 cells/uL (ref 850–3900)
MCH: 27.4 pg (ref 27.0–33.0)
MCHC: 32.7 g/dL (ref 32.0–36.0)
MCV: 83.9 fL (ref 80.0–100.0)
MPV: 12 fL (ref 7.5–12.5)
Monocytes Relative: 6.6 %
Neutro Abs: 2759 cells/uL (ref 1500–7800)
Neutrophils Relative %: 44.5 %
Platelets: 262 10*3/uL (ref 140–400)
RBC: 5.4 10*6/uL — ABNORMAL HIGH (ref 3.80–5.10)
RDW: 13.1 % (ref 11.0–15.0)
Total Lymphocyte: 46.2 %
WBC: 6.2 10*3/uL (ref 3.8–10.8)

## 2020-06-12 ENCOUNTER — Other Ambulatory Visit: Payer: Self-pay

## 2020-06-12 ENCOUNTER — Ambulatory Visit (AMBULATORY_SURGERY_CENTER): Payer: Self-pay | Admitting: *Deleted

## 2020-06-12 ENCOUNTER — Encounter: Payer: Self-pay | Admitting: Gastroenterology

## 2020-06-12 VITALS — Ht 70.0 in | Wt 228.0 lb

## 2020-06-12 DIAGNOSIS — Z1211 Encounter for screening for malignant neoplasm of colon: Secondary | ICD-10-CM

## 2020-06-24 ENCOUNTER — Encounter: Payer: Self-pay | Admitting: Certified Registered Nurse Anesthetist

## 2020-06-25 ENCOUNTER — Ambulatory Visit (AMBULATORY_SURGERY_CENTER): Payer: Managed Care, Other (non HMO) | Admitting: Gastroenterology

## 2020-06-25 ENCOUNTER — Other Ambulatory Visit: Payer: Self-pay

## 2020-06-25 ENCOUNTER — Encounter: Payer: Self-pay | Admitting: Gastroenterology

## 2020-06-25 VITALS — BP 126/81 | HR 61 | Temp 97.3°F | Resp 13 | Ht 70.0 in | Wt 228.0 lb

## 2020-06-25 DIAGNOSIS — Z1211 Encounter for screening for malignant neoplasm of colon: Secondary | ICD-10-CM

## 2020-06-25 MED ORDER — SODIUM CHLORIDE 0.9 % IV SOLN: 500.0000 mL | Freq: Once | INTRAVENOUS | Status: DC

## 2020-06-25 NOTE — Op Note (Signed)
[place] Patient Name: Sarah Warren Procedure Date: 06/25/2020 10:08 AM MRN: [id number] Endoscopist: Mallie Mussel L. Loletha Carrow , MD Age: 49 Referring MD:  Date of Birth: 08/31/1971 Gender: Female Account #: [id number] Procedure:                Colonoscopy Indications:              Screening for colorectal malignant neoplasm, This                            is the patient's first colonoscopy Medicines:                Monitored Anesthesia Care Procedure:                Pre-Anesthesia Assessment:                           - Prior to the procedure, a History and Physical                            was performed, and patient medications and                            allergies were reviewed. The patient's tolerance of                            previous anesthesia was also reviewed. The risks                            and benefits of the procedure and the sedation                            options and risks were discussed with the patient.                            All questions were answered, and informed consent                            was obtained. Prior Anticoagulants: The patient has                            taken no previous anticoagulant or antiplatelet                            agents. ASA Grade Assessment: II - A patient with                            mild systemic disease. After reviewing the risks                            and benefits, the patient was deemed in                            satisfactory condition to undergo the procedure.  After obtaining informed consent, the colonoscope                            was passed under direct vision. Throughout the                            procedure, the patient's blood pressure, pulse, and                            oxygen saturations were monitored continuously. The                            Colonoscope was introduced through the anus and                            advanced to the the cecum,  identified by                            appendiceal orifice and ileocecal valve. The                            colonoscopy was performed without difficulty. The                            patient tolerated the procedure well. The bowel                            preparation used was Miralax. The quality of the                            bowel preparation was fair (lavage performed). The                            ileocecal valve, appendiceal orifice, and rectum                            were photographed. The bowel preparation used was                            Miralax. Scope In: 10:14:18 AM Scope Out: 10:31:25 AM Scope Withdrawal Time: 0 hours 11 minutes 57 seconds  Total Procedure Duration: 0 hours 17 minutes 7 seconds  Findings:                 The perianal and digital rectal examinations were                            normal.                           The entire examined colon appeared normal on direct                            and retroflexion views. Complications:            No immediate complications. Estimated  Blood Loss:     Estimated blood loss: none. Impression:               - Preparation of the colon was fair.                           - The entire examined colon is normal on direct and                            retroflexion views.                           - No specimens collected. Recommendation:           - Patient has a contact number available for                            emergencies. The signs and symptoms of potential                            delayed complications were discussed with the                            patient. Return to normal activities tomorrow.                            Written discharge instructions were provided to the                            patient.                           - Resume previous diet.                           - Continue present medications.                           (Suprep or Pelnvu for next exam)                            - Repeat colonoscopy in 5 years for screening                            purposes (prep). Suprep or Plenvu for next exam. Mallie Mussel L. Loletha Carrow, MD 06/25/2020 10:44:18 AM This report has been signed electronically.

## 2020-06-25 NOTE — Progress Notes (Signed)
Report given to PACU, vss 

## 2020-06-25 NOTE — Patient Instructions (Signed)
YOU HAD AN ENDOSCOPIC PROCEDURE TODAY AT THE [facility]:   Refer to the procedure report that was given to you for any specific questions about what was found during the examination.  If the procedure report does not answer your questions, please call your gastroenterologist to clarify.  If you requested that your care partner not be given the details of your procedure findings, then the procedure report has been included in a sealed envelope for you to review at your convenience later.  YOU SHOULD EXPECT: Some feelings of bloating in the abdomen. Passage of more gas than usual.  Walking can help get rid of the air that was put into your GI tract during the procedure and reduce the bloating. If you had a lower endoscopy (such as a colonoscopy or flexible sigmoidoscopy) you may notice spotting of blood in your stool or on the toilet paper. If you underwent a bowel prep for your procedure, you may not have a normal bowel movement for a few days.  Please Note:  You might notice some irritation and congestion in your nose or some drainage.  This is from the oxygen used during your procedure.  There is no need for concern and it should clear up in a day or so.  SYMPTOMS TO REPORT IMMEDIATELY:   Following lower endoscopy (colonoscopy or flexible sigmoidoscopy):  Excessive amounts of blood in the stool  Significant tenderness or worsening of abdominal pains  Swelling of the abdomen that is new, acute  Fever of 100F or higher  For urgent or emergent issues, a gastroenterologist can be reached at any hour by calling [phone]. Do not use MyChart messaging for urgent concerns.    DIET:  We do recommend a small meal at first, but then you may proceed to your regular diet.  Drink plenty of fluids but you should avoid alcoholic beverages for 24 hours.  ACTIVITY:  You should plan to take it easy for the rest of today and you should NOT DRIVE or use heavy machinery until tomorrow (because  of the sedation medicines used during the test).    FOLLOW UP: Our staff will call the number listed on your records 48-72 hours following your procedure to check on you and address any questions or concerns that you may have regarding the information given to you following your procedure. If we do not reach you, we will leave a message.  We will attempt to reach you two times.  During this call, we will ask if you have developed any symptoms of COVID 19. If you develop any symptoms (ie: fever, flu-like symptoms, shortness of breath, cough etc.) before then, please call [phone].  If you test positive for Covid 19 in the 2 weeks post procedure, please call and report this information to us.    If any biopsies were taken you will be contacted by phone or by letter within the next 1-3 weeks.  Please call us at [phone] if you have not heard about the biopsies in 3 weeks.    SIGNATURES/CONFIDENTIALITY: You and/or your care partner have signed paperwork which will be entered into your electronic medical record.  These signatures attest to the fact that that the information above on your After Visit Summary has been reviewed and is understood.  Full responsibility of the confidentiality of this discharge information lies with you and/or your care-partner. 

## 2020-06-25 NOTE — Progress Notes (Signed)
[facility]  Pt's states no medical or surgical changes since previsit or office visit. 

## 2020-06-27 ENCOUNTER — Telehealth: Payer: Self-pay

## 2020-06-27 NOTE — Telephone Encounter (Signed)
No answer, unable to leave a message, B.Kyndal Gloster RN. 

## 2020-06-27 NOTE — Telephone Encounter (Signed)
Attempted to reach patient for post-procedure f/u call. No answer. Unable to leave message as voicemail has not been set up yet.

## 2020-07-10 ENCOUNTER — Encounter: Payer: Self-pay | Admitting: Physician Assistant

## 2020-07-10 ENCOUNTER — Ambulatory Visit (INDEPENDENT_AMBULATORY_CARE_PROVIDER_SITE_OTHER): Payer: Managed Care, Other (non HMO) | Admitting: Physician Assistant

## 2020-07-10 ENCOUNTER — Other Ambulatory Visit: Payer: Self-pay

## 2020-07-10 VITALS — BP 134/80 | HR 64 | Temp 97.4°F | Wt 236.0 lb

## 2020-07-10 DIAGNOSIS — I10 Essential (primary) hypertension: Secondary | ICD-10-CM

## 2020-07-10 DIAGNOSIS — E785 Hyperlipidemia, unspecified: Secondary | ICD-10-CM

## 2020-07-10 DIAGNOSIS — E538 Deficiency of other specified B group vitamins: Secondary | ICD-10-CM

## 2020-07-10 DIAGNOSIS — Z79899 Other long term (current) drug therapy: Secondary | ICD-10-CM | POA: Diagnosis not present

## 2020-07-10 DIAGNOSIS — E1169 Type 2 diabetes mellitus with other specified complication: Secondary | ICD-10-CM | POA: Diagnosis not present

## 2020-07-10 DIAGNOSIS — E559 Vitamin D deficiency, unspecified: Secondary | ICD-10-CM | POA: Diagnosis not present

## 2020-07-10 NOTE — Patient Instructions (Signed)
RANGE OF A1C   Your A1C is a measure of your sugar over the past 3 months and is not affected by what you have eaten over the past few days. Diabetes increases your chances of stroke and heart attack over 300 % and is the leading cause of blindness and kidney failure in the [place]. Please make sure you decrease bad carbs like white bread, white rice, potatoes, corn, soft drinks, pasta, cereals, refined sugars, sweet tea, dried fruits, and fruit juice. Good carbs are okay to eat in moderation like sweet potatoes, brown rice, whole grain pasta/bread, most fruit (except dried fruit) and you can eat as many veggies as you want.   Greater than 6.5 is considered diabetic. Between 6.4 and 5.7 is prediabetic If your A1C is less than 5.7 you are NOT diabetic.  Targets for Glucose Readings: Time of Check Target for patients WITHOUT Diabetes Target for DIABETICS  Before Meals Less than 100  less than 150  Two hours after meals Less than 200  Less than 250    General eating tips  What to Avoid . Avoid added sugars o Often added sugar can be found in processed foods such as many condiments, dry cereals, cakes, cookies, chips, crisps, crackers, candies, sweetened drinks, etc.  o Read labels and AVOID/DECREASE use of foods with the following in their ingredient list: Sugar, fructose, high fructose corn syrup, sucrose, glucose, maltose, dextrose, molasses, cane sugar, brown sugar, any type of syrup, agave nectar, etc.   . Avoid snacking in between meals- drink water or if you feel you need a snack, pick a high water content snack such as cucumbers, watermelon, or any veggie.  Marland Kitchen Avoid foods made with flour o If you are going to eat food made with flour, choose those made with whole-grains; and, minimize your consumption as much as is tolerable . Avoid processed foods o These foods are generally stocked in the middle of the grocery store.  o Focus on shopping on the perimeter of the grocery.  What to  Include . Vegetables o GREEN LEAFY VEGETABLES: Kale, spinach, mustard greens, collard greens, cabbage, broccoli, etc. o OTHER: Asparagus, cauliflower, eggplant, carrots, peas, Brussel sprouts, tomatoes, bell peppers, zucchini, beets, cucumbers, etc. . Grains, seeds, and legumes o Beans: kidney beans, black eyed peas, garbanzo beans, black beans, pinto beans, etc. o Whole, unrefined grains: brown rice, barley, bulgur, oatmeal, etc. . Healthy fats  o Avoid highly processed fats such as vegetable oil o Examples of healthy fats: avocado, olives, virgin olive oil, dark chocolate (?72% Cocoa), nuts (peanuts, almonds, walnuts, cashews, pecans, etc.) o Please still do small amount of these healthy fats, they are dense in calories.  . Low - Moderate Intake of Animal Sources of Protein o Meat sources: chicken, [place], salmon, tuna. Limit to 4 ounces of meat at one time or the size of your palm. o Consider limiting dairy sources, but when choosing dairy focus on: PLAIN [place] yogurt, cottage cheese, high-protein milk . Fruit o Choose berries   Semaglutide oral tablets RYBELSUS- CHECK OUT ONLINE What is this medicine? SEMAGLUTIDE (Sem a GLOO tide) is used to improve blood sugar control in adults with type 2 diabetes. This medicine may be used with other diabetes medicines. This medicine may be used for other purposes; ask your health care provider or pharmacist if you have questions. COMMON BRAND NAME(S): Rybelsus What should I tell my health care provider before I take this medicine? They need to know if you have  any of these conditions:  endocrine tumors (MEN 2) or if someone in your family had these tumors  history of pancreatitis  kidney disease  stomach problems  thyroid cancer or if someone in your family had thyroid cancer  an unusual or allergic reaction to semaglutide, other medicines, foods, dyes, or preservatives  pregnant or trying to get pregnant  breast-feeding How should I  use this medicine? Take this medicine by mouth with a glass of plain water that is less than 4 ounces (less than 120 mL). Follow the directions on the prescription label. Do not cut, crush or chew this medicine. Swallow the tablets whole. Take at least 30 minutes before the first food, other beverage, or other oral medications of the day. Take your medicine at regular intervals. Do not take your medicine more often than directed. Do not stop taking except on your doctor's advice. A special MedGuide will be given to you by the pharmacist with each prescription and refill. Be sure to read this information carefully each time. Talk to your pediatrician regarding the use of this medicine in children. Special care may be needed. Overdosage: If you think you have taken too much of this medicine contact a poison control center or emergency room at once. NOTE: This medicine is only for you. Do not share this medicine with others. What if I miss a dose? If you miss a dose, skip it. Take your next dose at the normal time. Do not take extra or 2 doses at the same time to make up for the missed dose. What may interact with this medicine? What may interact with this medicine?  aminophylline  carbamazepine  cyclosporine  digoxin  levothyroxine  other medicines for diabetes  phenytoin  tacrolimus  theophylline  warfarin Many medications may cause changes in blood sugar, these include:  alcohol containing beverages  antiviral medicines for HIV or AIDS  aspirin and aspirin-like drugs  certain medicines for blood pressure, heart disease, irregular heart beat  chromium  diuretics  female hormones, such as estrogens or progestins, birth control pills  fenofibrate  gemfibrozil  isoniazid  lanreotide  female hormones or anabolic steroids  MAOIs like Carbex, Eldepryl, Marplan, Nardil, and Parnate  medicines for weight loss  medicines for allergies, asthma, cold, or  cough  medicines for depression, anxiety, or psychotic disturbances  niacin  nicotine  NSAIDs, medicines for pain and inflammation, like ibuprofen or naproxen  octreotide  pasireotide  pentamidine  phenytoin  probenecid  quinolone antibiotics such as ciprofloxacin, levofloxacin, ofloxacin  some herbal dietary supplements  steroid medicines such as prednisone or cortisone  sulfamethoxazole; trimethoprim  thyroid hormones Some medications can hide the warning symptoms of low blood sugar (hypoglycemia). You may need to monitor your blood sugar more closely if you are taking one of these medications. These include:  beta-blockers, often used for high blood pressure or heart problems (examples include atenolol, metoprolol, propranolol)  clonidine  guanethidine  reserpine This list may not describe all possible interactions. Give your health care provider a list of all the medicines, herbs, non-prescription drugs, or dietary supplements you use. Also tell them if you smoke, drink alcohol, or use illegal drugs. Some items may interact with your medicine. What should I watch for while using this medicine? Visit your doctor or health care professional for regular checks on your progress. Drink plenty of fluids while taking this medicine. Check with your doctor or health care professional if you get an attack of severe diarrhea, nausea,  and vomiting. The loss of too much body fluid can make it dangerous for you to take this medicine. A test called the HbA1C (A1C) will be monitored. This is a simple blood test. It measures your blood sugar control over the last 2 to 3 months. You will receive this test every 3 to 6 months. Learn how to check your blood sugar. Learn the symptoms of low and high blood sugar and how to manage them. Always carry a quick-source of sugar with you in case you have symptoms of low blood sugar. Examples include hard sugar candy or glucose tablets. Make sure  others know that you can choke if you eat or drink when you develop serious symptoms of low blood sugar, such as seizures or unconsciousness. They must get medical help at once. Tell your doctor or health care professional if you have high blood sugar. You might need to change the dose of your medicine. If you are sick or exercising more than usual, you might need to change the dose of your medicine. Do not skip meals. Ask your doctor or health care professional if you should avoid alcohol. Many nonprescription cough and cold products contain sugar or alcohol. These can affect blood sugar. Wear a medical ID bracelet or chain, and carry a card that describes your disease and details of your medicine and dosage times. Do not become pregnant while taking this medicine. Women should inform their doctor if they wish to become pregnant or think they might be pregnant. There is a potential for serious side effects to an unborn child. Talk to your health care professional or pharmacist for more information. Do not breast-feed an infant while taking this medicine. What side effects may I notice from receiving this medicine? Side effects that you should report to your doctor or health care professional as soon as possible:  allergic reactions like skin rash, itching or hives, swelling of the face, lips, or tongue  breathing problems  changes in vision  diarrhea that continues or is severe  lump or swelling on the neck  severe nausea  signs and symptoms of infection like fever or chills; cough; sore throat; pain or trouble passing urine  signs and symptoms of low blood sugar such as feeling anxious, confusion, dizziness, increased hunger, unusually weak or tired, sweating, shakiness, cold, irritable, headache, blurred vision, fast heartbeat, loss of consciousness  signs and symptoms of kidney injury like trouble passing urine or change in the amount of urine  trouble swallowing  unusual stomach  upset or pain  vomiting Side effects that usually do not require medical attention (report these to your doctor or health care professional if they continue or are bothersome):  constipation  diarrhea  nausea  stomach upset This list may not describe all possible side effects. Call your doctor for medical advice about side effects. You may report side effects to FDA at 1-800-FDA-1088. Where should I keep my medicine? Keep out of the reach of children. Store at room temperature between 15 and 30 degrees C (59 and 86 degrees F). Keep this medicine in the original blister card until use. Keep in a dry place. Throw away any unused medicine after the expiration date. NOTE: This sheet is a summary. It may not cover all possible information. If you have questions about this medicine, talk to your doctor, pharmacist, or health care provider.  2020 Elsevier/Gold Standard (2018-08-17 10:07:43)

## 2020-07-10 NOTE — Progress Notes (Signed)
FOLLOW UP  Assessment and Plan:  BMI 34 - follow up 1 month for progress monitoring - increase veggies, decrease carbs DISCUSSED MOVING JARDIANCE TO RYBELSUS FOR WEIGHT LOSS PENDING A1C - long discussion about weight loss, diet, and exercise  Benign labile hypertension - continue medications, DASH diet, exercise and monitor at home. Call if greater than 130/80.   Hyperlipidemia associated with type 2 diabetes mellitus (HCC) -     TSH -     Lipid panel -     Hemoglobin A1c -     empagliflozin (JARDIANCE) 25 MG TABS tablet; Take 25 mg by mouth daily. Will stop fluid pill, given samples of jardiance with information, follow up 4-6 week for labs and office visit- if any yeast infection can try ryeblsus.  Discussed general issues about diabetes pathophysiology and management., Educational material distributed., Suggested low cholesterol diet., Encouraged aerobic exercise., Discussed foot care., Reminded to get yearly retinal exam.  Type 2 diabetes mellitus with hyperlipidemia (HCC) -     Hemoglobin A1c -     empagliflozin (JARDIANCE) 25 MG TABS tablet; Take 25 mg by mouth daily. DISCUSSED MOVING JARDIANCE TO RYBELSUS FOR WEIGHT LOSS PENDING A1C Discussed general issues about diabetes pathophysiology and management., Educational material distributed., Suggested low cholesterol diet., Encouraged aerobic exercise., Discussed foot care., Reminded to get yearly retinal exam.  Medication management -     CBC with Differential/Platelet -     COMPLETE METABOLIC PANEL WITH GFR -     Magnesium  Vitamin D deficiency -     VITAMIN D 25 Hydroxy (Vit-D Deficiency, Fractures)   Discussed med's effects and SE's. Screening labs and tests as requested with regular follow-up as recommended. Over 40 minutes of exam, counseling, chart review and critical decision making was performed  HPI  This very nice 49 y.o. AA female presents for follow up for DM   She does not workout, denies CP/SOB. BP: 134/80    She has a history of childhood asthma that is well controlled, SHE DENIES ANY SOB OR DYSPNEA WITH EXERTION.  Finally, patient has history of Vitamin D Deficiency, she is on it twice a week.  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   BMI is Body mass index is 33.86 kg/m., she is working on diet and exercise. She was doing weight loss classes but it not anymore and has gained the majority of her weight back.  Wt Readings from Last 3 Encounters:  07/10/20 236 lb (107 kg)  06/25/20 (!) 228 lb (103.4 kg)  06/12/20 228 lb (103.4 kg)   Due to her obesity she has diabetes since her last visit.  With hyperlipidemia She is not checking her sugars. She is starting healthy eating/weight loss- her son wants to drop weight for football.   She denies diabetic polys.  Lab Results  Component Value Date   HGBA1C 6.4 (H) 04/02/2020   She has hyperlipidemia but is not on medication. Her last cholesterol was Lab Results  Component Value Date   CHOL 198 04/02/2020   HDL 52 04/02/2020   LDLCALC 130 (H) 04/02/2020   TRIG 64 04/02/2020   CHOLHDL 3.8 04/02/2020     Current Medications:   Current Outpatient Medications (Endocrine & Metabolic):  .  empagliflozin (JARDIANCE) 25 MG TABS tablet, Take 1 tablet (25 mg total) by mouth daily.     Current Outpatient Medications (Hematological):  Marland Kitchen  Cyanocobalamin (VITAMIN B12 PO), Take by mouth daily. Takes 3-4 tablets twice a week  Current Outpatient Medications (Other):  Marland Kitchen  Latanoprost 0.005 % EMUL, Place 0.005 drops into both eyes at bedtime. .  Multiple Vitamin (MULTIVITAMIN) tablet, Take 1 tablet by mouth daily. .  timolol (TIMOPTIC) 0.5 % ophthalmic solution, Place 0.5 drops into both eyes 2 (two) times daily. .  Vitamin D, Ergocalciferol, (DRISDOL) 1.25 MG (50000 UT) CAPS capsule, 1 pill twice a week for 8 week for severe def   Medical History:  Past Medical  History:  Diagnosis Date  . Ankle swelling   . Asthma    childhood  . Glaucoma   . Hypertension   . Uterine fibroid    Allergies No Known Allergies  SURGICAL HISTORY She  has a past surgical history that includes Wisdom tooth extraction. FAMILY HISTORY Her family history includes Diabetes in her father, maternal grandmother, and mother; Hypertension in her father and mother. SOCIAL HISTORY She  reports that she has never smoked. She has never used smokeless tobacco. She reports current alcohol use. She reports that she does not use drugs. 4 x a month  Review of Systems: Review of Systems  Constitutional: Negative.   HENT: Negative.   Eyes: Negative.   Respiratory: Negative.   Cardiovascular: Negative.   Gastrointestinal: Negative.   Genitourinary: Negative.   Musculoskeletal: Negative.   Skin: Negative for itching and rash.  Neurological: Negative.   Endo/Heme/Allergies: Negative.   Psychiatric/Behavioral: Negative.     Physical Exam: Estimated body mass index is 33.86 kg/m as calculated from the following:   Height as of 06/25/20: 5\' 10"  (1.778 m).   Weight as of this encounter: 236 lb (107 kg). BP 134/80   Pulse 64   Temp (!) 97.4 F (36.3 C)   Wt 236 lb (107 kg)   SpO2 99%   BMI 33.86 kg/m  General Appearance: Well nourished, in no apparent distress.  Eyes: PERRLA, EOMs, conjunctiva no swelling or erythema, normal fundi and vessels.  Sinuses: No Frontal/maxillary tenderness  ENT/Mouth: Ext aud canals clear, normal light reflex with TMs without erythema, bulging. Good dentition. No erythema, swelling, or exudate on post pharynx. Tonsils not swollen or erythematous. Hearing normal.  Neck: Supple, thyroid normal. No bruits  Respiratory: Respiratory effort normal, BS equal bilaterally without rales, rhonchi, wheezing or stridor.  Cardio: RRR without murmurs, rubs or gallops. Brisk peripheral pulses without edema.  Chest: symmetric, with normal excursions and  percussion.  Abdomen: Soft, nontender, + fixed enlarged uterus, nontender, rebound, hernias, masses, or organomegaly.  Lymphatics: Non tender without lymphadenopathy.  Musculoskeletal: Full ROM all peripheral extremities,5/5 strength, and normal gait.  Skin: Acne on nose. Warm, dry without rashes, lesions, ecchymosis. Neuro: Cranial nerves intact, reflexes equal bilaterally. Normal muscle tone, no cerebellar symptoms. Sensation intact.  Psych: Awake and oriented X 3, normal affect, Insight and Judgment appropriate.   Vicie Mutters 4:29 PM [facility]

## 2020-07-11 ENCOUNTER — Ambulatory Visit: Payer: Managed Care, Other (non HMO) | Admitting: Physician Assistant

## 2020-07-11 LAB — CBC WITH DIFFERENTIAL/PLATELET
Absolute Monocytes: 539 cells/uL (ref 200–950)
Basophils Absolute: 39 cells/uL (ref 0–200)
Basophils Relative: 0.5 %
Eosinophils Absolute: 131 cells/uL (ref 15–500)
Eosinophils Relative: 1.7 %
HCT: 45.6 % — ABNORMAL HIGH (ref 35.0–45.0)
Hemoglobin: 14.8 g/dL (ref 11.7–15.5)
Lymphs Abs: 3473 cells/uL (ref 850–3900)
MCH: 26.9 pg — ABNORMAL LOW (ref 27.0–33.0)
MCHC: 32.5 g/dL (ref 32.0–36.0)
MCV: 82.9 fL (ref 80.0–100.0)
MPV: 12.7 fL — ABNORMAL HIGH (ref 7.5–12.5)
Monocytes Relative: 7 %
Neutro Abs: 3519 cells/uL (ref 1500–7800)
Neutrophils Relative %: 45.7 %
Platelets: 266 10*3/uL (ref 140–400)
RBC: 5.5 10*6/uL — ABNORMAL HIGH (ref 3.80–5.10)
RDW: 13.9 % (ref 11.0–15.0)
Total Lymphocyte: 45.1 %
WBC: 7.7 10*3/uL (ref 3.8–10.8)

## 2020-07-11 LAB — COMPLETE METABOLIC PANEL WITH GFR
AG Ratio: 1.1 (calc) (ref 1.0–2.5)
ALT: 16 U/L (ref 6–29)
AST: 16 U/L (ref 10–35)
Albumin: 3.9 g/dL (ref 3.6–5.1)
Alkaline phosphatase (APISO): 71 U/L (ref 37–153)
BUN: 12 mg/dL (ref 7–25)
CO2: 26 mmol/L (ref 20–32)
Calcium: 9.2 mg/dL (ref 8.6–10.4)
Chloride: 105 mmol/L (ref 98–110)
Creat: 0.71 mg/dL (ref 0.50–1.05)
GFR, Est African American: 115 mL/min/{1.73_m2} (ref >=60)
GFR, Est Non African American: 99 mL/min/{1.73_m2} (ref >=60)
Globulin: 3.5 g/dL (calc) (ref 1.9–3.7)
Glucose, Bld: 82 mg/dL (ref 65–99)
Potassium: 4.2 mmol/L (ref 3.5–5.3)
Sodium: 139 mmol/L (ref 135–146)
Total Bilirubin: 0.3 mg/dL (ref 0.2–1.2)
Total Protein: 7.4 g/dL (ref 6.1–8.1)

## 2020-07-11 LAB — LIPID PANEL
Cholesterol: 202 mg/dL — ABNORMAL HIGH (ref <200)
HDL: 54 mg/dL (ref >=50)
LDL Cholesterol (Calc): 133 mg/dL (calc) — ABNORMAL HIGH
Non-HDL Cholesterol (Calc): 148 mg/dL (calc) — ABNORMAL HIGH (ref <130)
Total CHOL/HDL Ratio: 3.7 (calc) (ref <5.0)
Triglycerides: 59 mg/dL (ref <150)

## 2020-07-11 LAB — HEMOGLOBIN A1C
Hgb A1c MFr Bld: 6 % of total Hgb — ABNORMAL HIGH (ref <5.7)
Mean Plasma Glucose: 126 (calc)
eAG (mmol/L): 7 (calc)

## 2020-07-11 LAB — MAGNESIUM: Magnesium: 2 mg/dL (ref 1.5–2.5)

## 2020-07-11 LAB — VITAMIN D 25 HYDROXY (VIT D DEFICIENCY, FRACTURES): Vit D, 25-Hydroxy: 74 ng/mL (ref 30–100)

## 2020-07-11 LAB — TSH: TSH: 1.84 mIU/L

## 2020-08-23 ENCOUNTER — Other Ambulatory Visit: Payer: Self-pay | Admitting: Physician Assistant

## 2020-08-23 DIAGNOSIS — E559 Vitamin D deficiency, unspecified: Secondary | ICD-10-CM

## 2020-08-23 DIAGNOSIS — E1169 Type 2 diabetes mellitus with other specified complication: Secondary | ICD-10-CM

## 2020-08-23 LAB — HM MAMMOGRAPHY

## 2020-08-24 ENCOUNTER — Encounter: Payer: Self-pay | Admitting: Physician Assistant

## 2020-08-24 DIAGNOSIS — R928 Other abnormal and inconclusive findings on diagnostic imaging of breast: Secondary | ICD-10-CM | POA: Insufficient documentation

## 2020-09-05 LAB — HM MAMMOGRAPHY

## 2020-10-15 NOTE — Progress Notes (Signed)
FOLLOW UP  Assessment and Plan:   Hypertension Mildly elevated; working on lifestyle, strong preference to avoid medications Monitor blood pressure at home; patient to call if consistently greater than 130/80 Continue DASH diet.   Reminder to go to the ER if any CP, SOB, nausea, dizziness, severe HA, changes vision/speech, left arm numbness and tingling and jaw pain. Will do close follow up in 3 months  Cholesterol Currently above goal; discussed increased CVD risk with diabetes, risk reduction Her goal is to reverse diabetes with lifestyle and get off of medication Discussed low cholesterol diet and exercise.  Check lipid panel.   Diabetes without complications Continue medication: jardiance 25 mg daily  ? SE with metformin Continue diet and exercise.  Perform daily foot/skin check, notify office of any concerning changes.  Check A1C  Obesity with co morbidities Long discussion about weight loss, diet, and exercise Recommended diet heavy in fruits and veggies and low in animal meats, cheeses, and dairy products, appropriate calorie intake Discussed ideal weight for height and initial weight goal (<200lb) Patient will work on reducing bread; discussed high fiber diet Will follow up in 3 months  Vitamin D Def At goal at last visit; continue supplementation to maintain goal of 60-100 Defer Vit D level  Need for influenza vaccine Quadrivalent flu vaccine administered without complication today    Continue diet and meds as discussed. Further disposition pending results of labs. Discussed med's effects and SE's.   Over 30 minutes of exam, counseling, chart review, and critical decision making was performed.   Future Appointments  Date Time Provider [place]  04/03/2021 11:00 AM Garnet Sierras, NP [facility] None    ----------------------------------------------------------------------------------------------------------------------  HPI 49 y.o. female   presents for 3 month follow up on hypertension, cholesterol, diabetes, weight and vitamin D deficiency.    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   BMI is Body mass index is 33.58 kg/m., she has not been working on diet and exercise,  She knows she needs to do better with diet, was doing really well but been busy r/t work, needs to cut back on bread, particularly in AM. Will do oatmeal.  She does exercise, 3 days a week.  Wt Readings from Last 3 Encounters:  10/16/20 234 lb (106.1 kg)  07/10/20 236 lb (107 kg)  06/25/20 (!) 228 lb (103.4 kg)   Her blood pressure has been controlled at home (130s/80s, off of HCTZ on jardiance and working on lifestyle), today their BP is BP: 138/84  She does workout. She denies chest pain, shortness of breath, dizziness.   She is not on cholesterol medication per strong patient preference to reverse diebetes and limit pills. Her cholesterol is not at goal. The cholesterol last visit was:   Lab Results  Component Value Date   CHOL 202 (H) 07/10/2020   HDL 54 07/10/2020   LDLCALC 133 (H) 07/10/2020   TRIG 59 07/10/2020   CHOLHDL 3.7 07/10/2020    She has been working on diet and exercise for T2 diabetes (had 160+ fasting at GYN in 2020, had A1C of 6.7% in 11/2019, was concerned with hair loss on metformin), on jardiance 25 mg daily and doing well, and denies paresthesia of the feet, polydipsia, polyuria, visual disturbances, vomiting and weight loss. She does not currently check fasting glucose. Last A1C in the office was:  Lab Results  Component Value Date   HGBA1C 6.0 (H) 07/10/2020    Last GFR:  Lab Results  Component Value Date   GFRAA 115 07/10/2020   Patient is on Vitamin D supplement, taking 50000 IU twice weekly  Lab Results  Component Value Date   VD25OH 74 07/10/2020       Current Medications:  Current Outpatient Medications on File Prior to Visit  Medication Sig  . Cyanocobalamin (VITAMIN B12 PO) Take by mouth daily.  Takes 3-4 tablets twice a week  . JARDIANCE 25 MG TABS tablet TAKE 1 TABLET DAILY  . Latanoprost 0.005 % EMUL Place 0.005 drops into both eyes at bedtime.  . Multiple Vitamin (MULTIVITAMIN) tablet Take 1 tablet by mouth daily.  . timolol (TIMOPTIC) 0.5 % ophthalmic solution Place 0.5 drops into both eyes 2 (two) times daily.  . Vitamin D, Ergocalciferol, (DRISDOL) 1.25 MG (50000 UNIT) CAPS capsule TAKE 1 CAPSULE 2 TIMES WEEKLY   No current facility-administered medications on file prior to visit.     Allergies: No Known Allergies   Medical History:  Past Medical History:  Diagnosis Date  . Ankle swelling   . Asthma    childhood  . Glaucoma   . Hypertension   . Uterine fibroid    Family history- Reviewed and unchanged Social history- Reviewed and unchanged   Review of Systems:  Review of Systems  Constitutional: Negative for malaise/fatigue and weight loss.  HENT: Negative for hearing loss and tinnitus.   Eyes: Negative for blurred vision and double vision.  Respiratory: Negative for cough, shortness of breath and wheezing.   Cardiovascular: Negative for chest pain, palpitations, orthopnea, claudication and leg swelling.  Gastrointestinal: Negative for abdominal pain, blood in stool, constipation, diarrhea, heartburn, melena, nausea and vomiting.  Genitourinary: Negative.   Musculoskeletal: Negative for joint pain and myalgias.  Skin: Negative for rash.  Neurological: Negative for dizziness, tingling, sensory change, weakness and headaches.  Endo/Heme/Allergies: Negative for polydipsia.  Psychiatric/Behavioral: Negative.   All other systems reviewed and are negative.     Physical Exam: BP 138/84   Pulse (!) 58   Temp (!) 97.2 F (36.2 C)   Wt 234 lb (106.1 kg)   SpO2 99%   BMI 33.58 kg/m  Wt Readings from Last 3 Encounters:  10/16/20 234 lb (106.1 kg)  07/10/20 236 lb (107 kg)  06/25/20 (!) 228 lb (103.4 kg)   General Appearance: Well nourished, in no  apparent distress. Eyes: PERRLA, EOMs, conjunctiva no swelling or erythema Sinuses: No Frontal/maxillary tenderness ENT/Mouth: Ext aud canals clear, TMs without erythema, bulging. No erythema, swelling, or exudate on post pharynx.  Tonsils not swollen or erythematous. Hearing normal.  Neck: Supple, thyroid normal.  Respiratory: Respiratory effort normal, BS equal bilaterally without rales, rhonchi, wheezing or stridor.  Cardio: RRR with no MRGs. Brisk peripheral pulses without edema.  Abdomen: Soft, + BS.  Non tender, no guarding, rebound, hernias, masses. Lymphatics: Non tender without lymphadenopathy.  Musculoskeletal: Full ROM, 5/5 strength, Normal gait Skin: Warm, dry without rashes, lesions, ecchymosis.  Neuro: Cranial nerves intact. No cerebellar symptoms.  Psych: Awake and oriented X 3, normal affect, Insight and Judgment appropriate.    Izora Ribas, NP 4:39 PM [facility]

## 2020-10-16 ENCOUNTER — Ambulatory Visit (INDEPENDENT_AMBULATORY_CARE_PROVIDER_SITE_OTHER): Payer: Managed Care, Other (non HMO) | Admitting: Adult Health

## 2020-10-16 ENCOUNTER — Other Ambulatory Visit: Payer: Self-pay

## 2020-10-16 ENCOUNTER — Encounter: Payer: Self-pay | Admitting: Adult Health

## 2020-10-16 VITALS — BP 138/84 | HR 58 | Temp 97.2°F | Wt 234.0 lb

## 2020-10-16 DIAGNOSIS — Z79899 Other long term (current) drug therapy: Secondary | ICD-10-CM

## 2020-10-16 DIAGNOSIS — E1169 Type 2 diabetes mellitus with other specified complication: Secondary | ICD-10-CM | POA: Diagnosis not present

## 2020-10-16 DIAGNOSIS — Z23 Encounter for immunization: Secondary | ICD-10-CM | POA: Diagnosis not present

## 2020-10-16 DIAGNOSIS — E559 Vitamin D deficiency, unspecified: Secondary | ICD-10-CM

## 2020-10-16 DIAGNOSIS — J45909 Unspecified asthma, uncomplicated: Secondary | ICD-10-CM

## 2020-10-16 DIAGNOSIS — I10 Essential (primary) hypertension: Secondary | ICD-10-CM | POA: Diagnosis not present

## 2020-10-16 DIAGNOSIS — E669 Obesity, unspecified: Secondary | ICD-10-CM

## 2020-10-16 DIAGNOSIS — E785 Hyperlipidemia, unspecified: Secondary | ICD-10-CM

## 2020-10-16 NOTE — Patient Instructions (Addendum)
Goals    . Blood Pressure < 130/80      Recommend cutting down on bread as discussed  Aim for low processed, plant based high fiber diet for weight loss, cholesterol benefit and diabetes   Fiber Chart  You should be consuming 25-30g of fiber per day and drinking 8 glasses of water to help your bowels move regularly.  In the chart below you can look up how much fiber you are getting in an average day.  If you are not getting enough fiber, you should add a fiber supplement to your diet.  Examples of this include Metamucil, FiberCon and Citrucel.  These can be purchased at your local grocery store or pharmacy.      [email or web address]

## 2020-10-17 LAB — LIPID PANEL
Cholesterol: 204 mg/dL — ABNORMAL HIGH (ref <200)
HDL: 54 mg/dL (ref >=50)
LDL Cholesterol (Calc): 132 mg/dL (calc) — ABNORMAL HIGH
Non-HDL Cholesterol (Calc): 150 mg/dL (calc) — ABNORMAL HIGH (ref <130)
Total CHOL/HDL Ratio: 3.8 (calc) (ref <5.0)
Triglycerides: 85 mg/dL (ref <150)

## 2020-10-17 LAB — HEMOGLOBIN A1C
Hgb A1c MFr Bld: 6.2 % of total Hgb — ABNORMAL HIGH (ref <5.7)
Mean Plasma Glucose: 131 (calc)
eAG (mmol/L): 7.3 (calc)

## 2020-10-17 LAB — COMPLETE METABOLIC PANEL WITH GFR
AG Ratio: 1.1 (calc) (ref 1.0–2.5)
ALT: 12 U/L (ref 6–29)
AST: 14 U/L (ref 10–35)
Albumin: 3.9 g/dL (ref 3.6–5.1)
Alkaline phosphatase (APISO): 74 U/L (ref 37–153)
BUN: 11 mg/dL (ref 7–25)
CO2: 27 mmol/L (ref 20–32)
Calcium: 9.3 mg/dL (ref 8.6–10.4)
Chloride: 104 mmol/L (ref 98–110)
Creat: 0.72 mg/dL (ref 0.50–1.05)
GFR, Est African American: 113 mL/min/{1.73_m2} (ref >=60)
GFR, Est Non African American: 98 mL/min/{1.73_m2} (ref >=60)
Globulin: 3.7 g/dL (calc) (ref 1.9–3.7)
Glucose, Bld: 83 mg/dL (ref 65–99)
Potassium: 4 mmol/L (ref 3.5–5.3)
Sodium: 138 mmol/L (ref 135–146)
Total Bilirubin: 0.4 mg/dL (ref 0.2–1.2)
Total Protein: 7.6 g/dL (ref 6.1–8.1)

## 2020-10-17 LAB — MAGNESIUM: Magnesium: 2.1 mg/dL (ref 1.5–2.5)

## 2020-10-17 LAB — CBC WITH DIFFERENTIAL/PLATELET
Absolute Monocytes: 548 cells/uL (ref 200–950)
Basophils Absolute: 37 cells/uL (ref 0–200)
Basophils Relative: 0.5 %
Eosinophils Absolute: 148 cells/uL (ref 15–500)
Eosinophils Relative: 2 %
HCT: 46.2 % — ABNORMAL HIGH (ref 35.0–45.0)
Hemoglobin: 14.8 g/dL (ref 11.7–15.5)
Lymphs Abs: 3471 cells/uL (ref 850–3900)
MCH: 26 pg — ABNORMAL LOW (ref 27.0–33.0)
MCHC: 32 g/dL (ref 32.0–36.0)
MCV: 81.1 fL (ref 80.0–100.0)
MPV: 12.3 fL (ref 7.5–12.5)
Monocytes Relative: 7.4 %
Neutro Abs: 3197 cells/uL (ref 1500–7800)
Neutrophils Relative %: 43.2 %
Platelets: 260 10*3/uL (ref 140–400)
RBC: 5.7 10*6/uL — ABNORMAL HIGH (ref 3.80–5.10)
RDW: 14.4 % (ref 11.0–15.0)
Total Lymphocyte: 46.9 %
WBC: 7.4 10*3/uL (ref 3.8–10.8)

## 2020-10-17 LAB — TSH: TSH: 2.35 mIU/L

## 2021-01-31 ENCOUNTER — Encounter: Payer: Self-pay | Admitting: Adult Health

## 2021-01-31 ENCOUNTER — Ambulatory Visit (INDEPENDENT_AMBULATORY_CARE_PROVIDER_SITE_OTHER): Payer: Managed Care, Other (non HMO) | Admitting: Adult Health

## 2021-01-31 ENCOUNTER — Other Ambulatory Visit: Payer: Self-pay

## 2021-01-31 VITALS — BP 136/82 | HR 66 | Temp 97.5°F | Wt 229.0 lb

## 2021-01-31 DIAGNOSIS — Z79899 Other long term (current) drug therapy: Secondary | ICD-10-CM | POA: Diagnosis not present

## 2021-01-31 DIAGNOSIS — E785 Hyperlipidemia, unspecified: Secondary | ICD-10-CM

## 2021-01-31 DIAGNOSIS — I10 Essential (primary) hypertension: Secondary | ICD-10-CM | POA: Diagnosis not present

## 2021-01-31 DIAGNOSIS — E559 Vitamin D deficiency, unspecified: Secondary | ICD-10-CM

## 2021-01-31 DIAGNOSIS — E669 Obesity, unspecified: Secondary | ICD-10-CM

## 2021-01-31 DIAGNOSIS — E1169 Type 2 diabetes mellitus with other specified complication: Secondary | ICD-10-CM | POA: Diagnosis not present

## 2021-01-31 MED ORDER — VITAMIN D (ERGOCALCIFEROL) 1.25 MG (50000 UNIT) PO CAPS: ORAL_CAPSULE | ORAL | 3 refills | Status: DC

## 2021-01-31 NOTE — Patient Instructions (Addendum)
Goals    . Blood Pressure < 130/80    . Fasting glucose <100       Old fashioned oats (most whole grains), black beans (any beans), lentils, berries, fibrous veggies, seeds (chia seeds, ground flax seeds) are particularly high in soluble fiber    High-Fiber Eating Plan Fiber, also called dietary fiber, is a type of carbohydrate. It is found foods such as fruits, vegetables, whole grains, and beans. A high-fiber diet can have many health benefits. Your health care provider may recommend a high-fiber diet to help:  Prevent constipation. Fiber can make your bowel movements more regular.  Lower your cholesterol.  Relieve the following conditions: ? Inflammation of veins in the anus (hemorrhoids). ? Inflammation of specific areas of the digestive tract (uncomplicated diverticulosis). ? A problem of the large intestine, also called the colon, that sometimes causes pain and diarrhea (irritable bowel syndrome, or IBS).  Prevent overeating as part of a weight-loss plan.  Prevent heart disease, type 2 diabetes, and certain cancers. What are tips for following this plan? Reading food labels  Check the nutrition facts label on food products for the amount of dietary fiber. Choose foods that have 5 grams of fiber or more per serving.  The goals for recommended daily fiber intake include: ? Men (age 46 or younger): 34-38 g. ? Men (over age 27): 28-34 g. ? Women (age 81 or younger): 25-28 g. ? Women (over age 68): 22-25 g. Your daily fiber goal is _____________ g.   Shopping  Choose whole fruits and vegetables instead of processed forms, such as apple juice or applesauce.  Choose a wide variety of high-fiber foods such as avocados, lentils, oats, and kidney beans.  Read the nutrition facts label of the foods you choose. Be aware of foods with added fiber. These foods often have high sugar and sodium amounts per serving. Cooking  Use whole-grain flour for baking and cooking.  Cook with  brown rice instead of white rice. Meal planning  Start the day with a breakfast that is high in fiber, such as a cereal that contains 5 g of fiber or more per serving.  Eat breads and cereals that are made with whole-grain flour instead of refined flour or white flour.  Eat brown rice, bulgur wheat, or millet instead of white rice.  Use beans in place of meat in soups, salads, and pasta dishes.  Be sure that half of the grains you eat each day are whole grains. General information  You can get the recommended daily intake of dietary fiber by: ? Eating a variety of fruits, vegetables, grains, nuts, and beans. ? Taking a fiber supplement if you are not able to take in enough fiber in your diet. It is better to get fiber through food than from a supplement.  Gradually increase how much fiber you consume. If you increase your intake of dietary fiber too quickly, you may have bloating, cramping, or gas.  Drink plenty of water to help you digest fiber.  Choose high-fiber snacks, such as berries, raw vegetables, nuts, and popcorn. What foods should I eat? Fruits Berries. Pears. Apples. Oranges. Avocado. Prunes and raisins. Dried figs. Vegetables Sweet potatoes. Spinach. Kale. Artichokes. Cabbage. Broccoli. Cauliflower. Green peas. Carrots. Squash. Grains Whole-grain breads. Multigrain cereal. Oats and oatmeal. Brown rice. Barley. Bulgur wheat. [place]. Quinoa. Bran muffins. Popcorn. Rye wafer crackers. Meats and other proteins Navy beans, kidney beans, and pinto beans. Soybeans. Split peas. Lentils. Nuts and seeds. Dairy Fiber-fortified yogurt.  Beverages Fiber-fortified soy milk. Fiber-fortified orange juice. Other foods Fiber bars. The items listed above may not be a complete list of recommended foods and beverages. Contact a dietitian for more information. What foods should I avoid? Fruits Fruit juice. Cooked, strained fruit. Vegetables Fried potatoes. Canned vegetables.  Well-cooked vegetables. Grains White bread. Pasta made with refined flour. White rice. Meats and other proteins Fatty cuts of meat. Fried chicken or fried fish. Dairy Milk. Yogurt. Cream cheese. Sour cream. Fats and oils Butters. Beverages Soft drinks. Other foods Cakes and pastries. The items listed above may not be a complete list of foods and beverages to avoid. Talk with your dietitian about what choices are best for you. Summary  Fiber is a type of carbohydrate. It is found in foods such as fruits, vegetables, whole grains, and beans.  A high-fiber diet has many benefits. It can help to prevent constipation, lower blood cholesterol, aid weight loss, and reduce your risk of heart disease, diabetes, and certain cancers.  Increase your intake of fiber gradually. Increasing fiber too quickly may cause cramping, bloating, and gas. Drink plenty of water while you increase the amount of fiber you consume.  The best sources of fiber include whole fruits and vegetables, whole grains, nuts, seeds, and beans. This information is not intended to replace advice given to you by your health care provider. Make sure you discuss any questions you have with your health care provider. Document Revised: 03/15/2020 Document Reviewed: 03/15/2020 Elsevier Patient Education  2021 [employer].

## 2021-01-31 NOTE — Progress Notes (Signed)
FOLLOW UP  Assessment and Plan:   Hypertension Mildly elevated; working on lifestyle, strong preference to avoid medications Monitor blood pressure at home; patient to call if consistently greater than 130/80 Continue DASH diet.   Reminder to go to the ER if any CP, SOB, nausea, dizziness, severe HA, changes vision/speech, left arm numbness and tingling and jaw pain. Will do close follow up in 3 months  Cholesterol Currently above goal; discussed increased CVD risk with diabetes, risk reduction Her goal is to reverse diabetes with lifestyle and get off of medication Focusing on reducing saturated fat and increasing soluble fiber intake Discussed low cholesterol diet and exercise.  Check lipid panel.   Diabetes without complications Continue medication: jardiance 25 mg daily  ? SE with metformin Continue diet and exercise.  Perform daily foot/skin check, notify office of any concerning changes.  Check A1C  Obesity with co morbidities Long discussion about weight loss, diet, and exercise Recommended diet heavy in fruits and veggies and low in animal meats, cheeses, and dairy products, appropriate calorie intake Discussed ideal weight for height and initial weight goal (<200lb) Patient will work on reducing bread; discussed high fiber diet Will follow up in 3 months  Vitamin D Def At goal at last visit; continue supplementation to maintain goal of 60-100 Defer Vit D level    Continue diet and meds as discussed. Further disposition pending results of labs. Discussed med's effects and SE's.   Over 30 minutes of exam, counseling, chart review, and critical decision making was performed.   Future Appointments  Date Time Provider [place]  04/03/2021 11:00 AM Garnet Sierras, NP [facility] None    ----------------------------------------------------------------------------------------------------------------------  HPI 50 y.o. female  presents for 3 month follow up  on hypertension, cholesterol, diabetes, weight and vitamin D deficiency.    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   Abnormal mammogram 07/2020, right upper outer breast cluster calcification, had repeat that was improved, has upcoming planned in April (gets done at mobile unit at work).   BMI is Body mass index is 32.86 kg/m., she has been working on diet and exercise, she has tried intermittent fasting that did work. Trying to reduce saturated fat,  She does exercise, 3 days a week. On home scale 224.7 lb.  Wt Readings from Last 3 Encounters:  01/31/21 229 lb (103.9 kg)  10/16/20 234 lb (106.1 kg)  07/10/20 236 lb (107 kg)   Her blood pressure has been controlled at home (130s/80s, off of HCTZ on jardiance and working on lifestyle), today their BP is BP: 136/82  She does workout. She denies chest pain, shortness of breath, dizziness.   She is not on cholesterol medication per strong patient preference to reverse diebetes and limit pills, reducing saturated fat, doing oatmeal with black berries. Her cholesterol is not at goal. The cholesterol last visit was:   Lab Results  Component Value Date   CHOL 204 (H) 10/16/2020   HDL 54 10/16/2020   LDLCALC 132 (H) 10/16/2020   TRIG 85 10/16/2020   CHOLHDL 3.8 10/16/2020    She has been working on diet and exercise for T2 diabetes (had 160+ fasting at GYN in 2020, had A1C of 6.7% in 11/2019, was concerned with hair loss on metformin), on jardiance 25 mg daily and doing well, and denies paresthesia of the feet, polydipsia, polyuria, visual disturbances, vomiting and weight loss. She does check fasting daily, 99-129. Last A1C in the office was:  Lab Results  Component Value Date   HGBA1C 6.2 (H) 10/16/2020    Last GFR:  Lab Results  Component Value Date   GFRAA 113 10/16/2020   Patient is on Vitamin D supplement, taking 50000 IU twice weekly  Lab Results  Component Value Date   VD25OH 74 07/10/2020        Current  Medications:  Current Outpatient Medications on File Prior to Visit  Medication Sig  . Cyanocobalamin (VITAMIN B12 PO) Take by mouth daily. Takes 3-4 tablets twice a week  . JARDIANCE 25 MG TABS tablet TAKE 1 TABLET DAILY  . Latanoprost 0.005 % EMUL Place 0.005 drops into both eyes at bedtime.  . Multiple Vitamin (MULTIVITAMIN) tablet Take 1 tablet by mouth daily.  . timolol (TIMOPTIC) 0.5 % ophthalmic solution Place 0.5 drops into both eyes 2 (two) times daily.  . Vitamin D, Ergocalciferol, (DRISDOL) 1.25 MG (50000 UNIT) CAPS capsule TAKE 1 CAPSULE 2 TIMES WEEKLY   No current facility-administered medications on file prior to visit.     Allergies: No Known Allergies   Medical History:  Past Medical History:  Diagnosis Date  . Ankle swelling   . Asthma    childhood  . Glaucoma   . Hypertension   . Uterine fibroid    Family history- Reviewed and unchanged Social history- Reviewed and unchanged   Review of Systems:  Review of Systems  Constitutional: Negative for malaise/fatigue and weight loss.  HENT: Negative for hearing loss and tinnitus.   Eyes: Negative for blurred vision and double vision.  Respiratory: Negative for cough, shortness of breath and wheezing.   Cardiovascular: Negative for chest pain, palpitations, orthopnea, claudication and leg swelling.  Gastrointestinal: Negative for abdominal pain, blood in stool, constipation, diarrhea, heartburn, melena, nausea and vomiting.  Genitourinary: Negative.   Musculoskeletal: Negative for joint pain and myalgias.  Skin: Negative for rash.  Neurological: Negative for dizziness, tingling, sensory change, weakness and headaches.  Endo/Heme/Allergies: Negative for polydipsia.  Psychiatric/Behavioral: Negative.   All other systems reviewed and are negative.     Physical Exam: BP 136/82   Pulse 66   Temp (!) 97.5 F (36.4 C)   Wt 229 lb (103.9 kg)   SpO2 98%   BMI 32.86 kg/m  Wt Readings from Last 3 Encounters:   01/31/21 229 lb (103.9 kg)  10/16/20 234 lb (106.1 kg)  07/10/20 236 lb (107 kg)   General Appearance: Well nourished, in no apparent distress. Eyes: PERRLA, EOMs, conjunctiva no swelling or erythema Sinuses: No Frontal/maxillary tenderness ENT/Mouth: Ext aud canals clear, TMs without erythema, bulging. No erythema, swelling, or exudate on post pharynx.  Tonsils not swollen or erythematous. Hearing normal.  Neck: Supple, thyroid normal.  Respiratory: Respiratory effort normal, BS equal bilaterally without rales, rhonchi, wheezing or stridor.  Cardio: RRR with no MRGs. Brisk peripheral pulses without edema.  Abdomen: Soft, + BS.  Non tender, no guarding, rebound, hernias, masses. Lymphatics: Non tender without lymphadenopathy.  Musculoskeletal: Full ROM, 5/5 strength, Normal gait Skin: Warm, dry without rashes, lesions, ecchymosis.  Neuro: Cranial nerves intact. No cerebellar symptoms.  Psych: Awake and oriented X 3, normal affect, Insight and Judgment appropriate.    Izora Ribas, NP 4:15 PM [facility]

## 2021-02-01 LAB — CBC WITH DIFFERENTIAL/PLATELET
Absolute Monocytes: 495 cells/uL (ref 200–950)
Basophils Absolute: 38 cells/uL (ref 0–200)
Basophils Relative: 0.5 %
Eosinophils Absolute: 150 cells/uL (ref 15–500)
Eosinophils Relative: 2 %
HCT: 42.9 % (ref 35.0–45.0)
Hemoglobin: 13.9 g/dL (ref 11.7–15.5)
Lymphs Abs: 3420 cells/uL (ref 850–3900)
MCH: 25.8 pg — ABNORMAL LOW (ref 27.0–33.0)
MCHC: 32.4 g/dL (ref 32.0–36.0)
MCV: 79.6 fL — ABNORMAL LOW (ref 80.0–100.0)
MPV: 12.2 fL (ref 7.5–12.5)
Monocytes Relative: 6.6 %
Neutro Abs: 3398 cells/uL (ref 1500–7800)
Neutrophils Relative %: 45.3 %
Platelets: 251 10*3/uL (ref 140–400)
RBC: 5.39 10*6/uL — ABNORMAL HIGH (ref 3.80–5.10)
RDW: 14.5 % (ref 11.0–15.0)
Total Lymphocyte: 45.6 %
WBC: 7.5 10*3/uL (ref 3.8–10.8)

## 2021-02-01 LAB — HEMOGLOBIN A1C
Hgb A1c MFr Bld: 6.1 % of total Hgb — ABNORMAL HIGH (ref <5.7)
Mean Plasma Glucose: 128 mg/dL
eAG (mmol/L): 7.1 mmol/L

## 2021-02-01 LAB — COMPLETE METABOLIC PANEL WITH GFR
AG Ratio: 1.1 (calc) (ref 1.0–2.5)
ALT: 14 U/L (ref 6–29)
AST: 15 U/L (ref 10–35)
Albumin: 3.8 g/dL (ref 3.6–5.1)
Alkaline phosphatase (APISO): 62 U/L (ref 37–153)
BUN: 13 mg/dL (ref 7–25)
CO2: 24 mmol/L (ref 20–32)
Calcium: 9.1 mg/dL (ref 8.6–10.4)
Chloride: 107 mmol/L (ref 98–110)
Creat: 0.76 mg/dL (ref 0.50–1.05)
GFR, Est African American: 105 mL/min/{1.73_m2} (ref >=60)
GFR, Est Non African American: 91 mL/min/{1.73_m2} (ref >=60)
Globulin: 3.4 g/dL (calc) (ref 1.9–3.7)
Glucose, Bld: 76 mg/dL (ref 65–99)
Potassium: 4.3 mmol/L (ref 3.5–5.3)
Sodium: 139 mmol/L (ref 135–146)
Total Bilirubin: 0.3 mg/dL (ref 0.2–1.2)
Total Protein: 7.2 g/dL (ref 6.1–8.1)

## 2021-02-01 LAB — LIPID PANEL
Cholesterol: 187 mg/dL (ref <200)
HDL: 53 mg/dL (ref >=50)
LDL Cholesterol (Calc): 119 mg/dL (calc) — ABNORMAL HIGH
Non-HDL Cholesterol (Calc): 134 mg/dL (calc) — ABNORMAL HIGH (ref <130)
Total CHOL/HDL Ratio: 3.5 (calc) (ref <5.0)
Triglycerides: 59 mg/dL (ref <150)

## 2021-02-01 LAB — TSH: TSH: 1.74 mIU/L

## 2021-04-02 ENCOUNTER — Encounter: Payer: Managed Care, Other (non HMO) | Admitting: Physician Assistant

## 2021-04-03 ENCOUNTER — Encounter: Payer: Managed Care, Other (non HMO) | Admitting: Adult Health Nurse Practitioner

## 2021-06-03 NOTE — Progress Notes (Signed)
Complete Physical  Assessment and Plan:  Sarah Warren was seen today for annual exam.  Diagnoses and all orders for this visit:  Encounter for Annual Physical Exam with abnormal findings Due annually  Health Maintenance reviewed Healthy lifestyle reviewed and goals set  Benign labile hypertension Initiate medication: losartan 50 mg - start 1/2 tab and increase for goal in 1 week Monitor blood pressure at home; call if consistently over 130/80 Discussed DASH diet Advised to go to the ER if any CP, SOB, nausea, dizziness, severe HA, changes vision/speech, left arm numbness and tingling and jaw pain. Follow up in 4 weeks for recheck  -     COMPLETE METABOLIC PANEL WITH GFR -     Magnesium -     TSH -     Microalbumin / creatinine urine ratio -     Urinalysis, Routine w reflex microscopic -     EKG 12-Lead -     losartan (COZAAR) 50 MG tablet; Take 1 tablet (50 mg total) by mouth daily. -     BASIC METABOLIC PANEL WITH GFR; Future  Hyperlipidemia associated with type 2 diabetes mellitus (HCC) Mild elevations, patient preference to work aggressively on lifestyle to revers diabetes, will hold off on med for now and continue close follow up for progress monitoring LDL goal <70 Continue low cholesterol diet and exercise.  Check lipid panel.  -     Lipid panel -     TSH  Type 2 diabetes mellitus with hyperlipidemia (HCC) Type 2 Diabetes Mellitus - poorly controlled Education: Reviewed 'ABCs' of diabetes management (respective goals in parentheses):  A1C (<7), blood pressure (<130/80), and cholesterol (LDL <70) Eye Exam yearly and Dental Exam every 6 months. Dietary recommendations Physical Activity recommendations - foot exam completed, eye exam report requested -     COMPLETE METABOLIC PANEL WITH GFR -     Hemoglobin A1c  Uterine leiomyoma, unspecified location GYN following  Abnormal mammogram GYN following q77m, has been stable  B12 deficiency Continue supplement  -      Vitamin B12  Medication management -     CBC with Differential/Platelet -     COMPLETE METABOLIC PANEL WITH GFR -     Magnesium -     Urinalysis, Routine w reflex microscopic -     BASIC METABOLIC PANEL WITH GFR; Future  Morbid obesity ([place]) - BMI 35+ with htn, hld, T2DM Long discussion about weight loss, diet, and exercise Recommended diet heavy in fruits and veggies and low in animal meats, cheeses, and dairy products, appropriate calorie intake Patient will work on portions, reduce processed carb Discussed appropriate weight for height and initial goal (225 lb) Follow up at next visit in 3 months   Vitamin D deficiency -     VITAMIN D 25 Hydroxy (Vit-D Deficiency, Fractures)  Screening for thyroid disorder -     TSH  Screening for hematuria or proteinuria -     Microalbumin / creatinine urine ratio -     Urinalysis, Routine w reflex microscopic  Screening for cardiovascular condition -     EKG 12-Lead  Discussed med's effects and SE's. Screening labs and tests as requested with regular follow-up as recommended. Over 40 minutes of exam, counseling, chart review and critical decision making was performed  Future Appointments  Date Time Provider [place]  07/08/2021  4:30 PM [facility] NURSE [facility] None  09/10/2021  3:00 PM Sarah Comber, NP [facility] None  12/10/2021  3:30 PM Sarah Comber, NP [facility]  None  06/05/2022 10:00 AM Sarah Comber, NP [facility] None     HPI  This very nice 50 y.o. AA female presents for complete physical.  She has Morbid obesity ([place])- BMI 35+ with htn, T2DM; Vitamin D deficiency; B12 deficiency; Uterine leiomyoma; Medication management; Benign labile hypertension; Type 2 diabetes mellitus with hyperlipidemia ([place]); Hyperlipidemia associated with type 2 diabetes mellitus ([place]); and Abnormal mammogram on their problem list.   She has a 70 year old son, 12th grade PT or fire.  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   She follows with GYN Sarah Warren, very large uterine leioma "18-20 week uterus." Discussing procedure to address this.   Abnormal mammogram 07/2020, right upper outer breast cluster calcification, had repeat that was improved, reports had again in March 06 2021 (see in care everywhere) and was stable.   BMI is Body mass index is 35.12 kg/m., she has been working on diet and exercise but admits needs to do better. Has cut out most rice/pasta.  She does exercise, 3 days a week. Very rare soda/sweet tea, mostly water.  Wt Readings from Last 3 Encounters:  06/05/21 234 lb 6.4 oz (106.3 kg)  01/31/21 229 lb (103.9 kg)  10/16/20 234 lb (106.1 kg)   She has not been checking BP at home, today their BP is BP: (!) 146/84, similar by manual recheck.   She does workout. She denies chest pain, shortness of breath, dizziness.   She is not on cholesterol medication, strong patient preference to avoid . Her cholesterol is not at goal. The cholesterol last visit was:   Lab Results  Component Value Date   CHOL 187 01/31/2021   HDL 53 01/31/2021   LDLCALC 119 (H) 01/31/2021   TRIG 59 01/31/2021   CHOLHDL 3.5 01/31/2021    She has been working on diet and exercise for T2 diabetes (on jardiance, patient preference to minimize pills, stopped metformin), and denies foot ulcerations, hyperglycemia, hypoglycemia , increased appetite, nausea, paresthesia of the feet, polydipsia, polyuria, and visual disturbances. She does check fasting glucose, ranges 90-120s. Last A1C in the office was:  Lab Results  Component Value Date   HGBA1C 6.1 (H) 01/31/2021    Last GFR:  Lab Results  Component Value Date   GFRAA 105 01/31/2021   Patient is on Vitamin D supplement.   Lab Results  Component Value Date   VD25OH 74 07/10/2020     She takes B12 supplement regularly Lab Results  Component Value Date   VITAMINB12 1,638 (H) 05/16/2019      Current Medications:  Current Outpatient Medications on  File Prior to Visit  Medication Sig Dispense Refill   Cyanocobalamin (VITAMIN B12 PO) Take by mouth daily. Takes 3-4 tablets twice a week     JARDIANCE 25 MG TABS tablet TAKE 1 TABLET DAILY 90 tablet 3   Latanoprost 0.005 % EMUL Place 0.005 drops into both eyes at bedtime.     Multiple Vitamin (MULTIVITAMIN) tablet Take 1 tablet by mouth daily.     timolol (TIMOPTIC) 0.5 % ophthalmic solution Place 0.5 drops into both eyes 2 (two) times daily.     Vitamin D, Ergocalciferol, (DRISDOL) 1.25 MG (50000 UNIT) CAPS capsule TAKE 1 CAPSULE 2 TIMES WEEKLY 26 capsule 3   No current facility-administered medications on file prior to visit.   Health Maintenance:   Immunization History  Administered Date(s) Administered   Influenza Inj Mdck Quad With Preservative 09/30/2019, 10/16/2020   Influenza-Unspecified 09/13/2015  PFIZER(Purple Top)SARS-COV-2 Vaccination 02/18/2020, 03/13/2020   PPD Test 04/25/2014   Tdap 04/25/2014, 09/13/2015     TD/TDAP: 2015 Influenza: 09/2020 PPD 2015 Pneumovax: declines at this time Prevnar 13: N/A Shingrix; check with insurance Covid 19: 2/2, 2021  LMP: No LMP recorded. (Menstrual status: Oral contraceptives). Pap: 05/02/2021- Sarah Warren, normal pap MGM: 02/2021 at work, mobile comes around, will do 6 month follow up R calcifications Colonoscopy: 06/2020 no polyps, Sarah Warren, was recommended 5 year recall,  Due to inadequate prep   Last Dental Exam:  q 6 months  Last Eye Exam: Sarah Warren My eye doctor, 2022, glasses Sarah Warren retinal specialist for glaucoma q29m  Medical History:  Past Medical History:  Diagnosis Date   Asthma    childhood   Glaucoma    Hypertension    Uterine fibroid    Allergies No Known Allergies  SURGICAL HISTORY She  has a past surgical history that includes Wisdom tooth extraction. FAMILY HISTORY Her family history includes Diabetes in her father, maternal grandmother, and mother; Hypertension in her father and mother. SOCIAL  HISTORY She  reports that she has never smoked. She has never used smokeless tobacco. She reports current alcohol use. She reports that she does not use drugs. 4 x a month  Review of Systems: Review of Systems  Constitutional: Negative.  Negative for malaise/fatigue and weight loss.  HENT: Negative.  Negative for hearing loss and tinnitus.   Eyes: Negative.  Negative for blurred vision and double vision.  Respiratory: Negative.  Negative for cough, shortness of breath and wheezing.   Cardiovascular: Negative.  Negative for chest pain, palpitations, orthopnea, claudication and leg swelling.  Gastrointestinal: Negative.  Negative for abdominal pain, blood in stool, constipation, diarrhea, heartburn, melena, nausea and vomiting.  Genitourinary: Negative.   Musculoskeletal: Negative.  Negative for joint pain and myalgias.  Skin:  Negative for itching and rash.  Neurological: Negative.  Negative for dizziness, tingling, sensory change, weakness and headaches.  Endo/Heme/Allergies: Negative.  Negative for polydipsia.  Psychiatric/Behavioral: Negative.    All other systems reviewed and are negative.  Physical Exam: Estimated body mass index is 35.12 kg/m as calculated from the following:   Height as of this encounter: 5' 8.5" (1.74 m).   Weight as of this encounter: 234 lb 6.4 oz (106.3 kg). BP (!) 146/84   Pulse (!) 52   Temp 97.7 F (36.5 C)   Ht 5' 8.5" (1.74 m)   Wt 234 lb 6.4 oz (106.3 kg)   SpO2 98%   BMI 35.12 kg/m  General Appearance: Well nourished, in no apparent distress.  Eyes: PERRLA, EOMs, conjunctiva no swelling or erythema, normal fundi and vessels.  Sinuses: No Frontal/maxillary tenderness  ENT/Mouth: Ext aud canals clear, normal light reflex with TMs without erythema, bulging. Good dentition. No erythema, swelling, or exudate on post pharynx. Tonsils not swollen or erythematous. Hearing normal.  Neck: Supple, thyroid normal. No bruits  Respiratory: Respiratory effort  normal, BS equal bilaterally without rales, rhonchi, wheezing or stridor.  Cardio: RRR without murmurs, rubs or gallops. Brisk peripheral pulses without edema.  Chest: symmetric, with normal excursions and percussion.  Breasts: defer to GYN Abdomen: Soft, nontender, + fixed enlarged uterus, nontender, rebound, hernias, masses, or organomegaly.  Lymphatics: Non tender without lymphadenopathy.  Genitourinary: defer to GYN Musculoskeletal: Full ROM all peripheral extremities,5/5 strength, and normal gait.  Skin: Warm, dry without rashes, lesions, ecchymosis. Neuro: Cranial nerves intact, reflexes equal bilaterally. Normal muscle tone, no cerebellar symptoms. Sensation intact.  Psych: Awake and oriented X 3, normal affect, Insight and Judgment appropriate.   EKG: Sinus brady, no ST changes  Sarah Ribas, NP 12:27 PM [facility]

## 2021-06-05 ENCOUNTER — Ambulatory Visit (INDEPENDENT_AMBULATORY_CARE_PROVIDER_SITE_OTHER): Payer: Managed Care, Other (non HMO) | Admitting: Adult Health

## 2021-06-05 ENCOUNTER — Encounter: Payer: Self-pay | Admitting: Adult Health

## 2021-06-05 ENCOUNTER — Other Ambulatory Visit: Payer: Self-pay

## 2021-06-05 VITALS — BP 146/84 | HR 52 | Temp 97.7°F | Ht 68.5 in | Wt 234.4 lb

## 2021-06-05 DIAGNOSIS — I10 Essential (primary) hypertension: Secondary | ICD-10-CM | POA: Diagnosis not present

## 2021-06-05 DIAGNOSIS — Z1389 Encounter for screening for other disorder: Secondary | ICD-10-CM | POA: Diagnosis not present

## 2021-06-05 DIAGNOSIS — Z136 Encounter for screening for cardiovascular disorders: Secondary | ICD-10-CM | POA: Diagnosis not present

## 2021-06-05 DIAGNOSIS — J45909 Unspecified asthma, uncomplicated: Secondary | ICD-10-CM

## 2021-06-05 DIAGNOSIS — Z131 Encounter for screening for diabetes mellitus: Secondary | ICD-10-CM

## 2021-06-05 DIAGNOSIS — E538 Deficiency of other specified B group vitamins: Secondary | ICD-10-CM

## 2021-06-05 DIAGNOSIS — Z79899 Other long term (current) drug therapy: Secondary | ICD-10-CM | POA: Diagnosis not present

## 2021-06-05 DIAGNOSIS — Z13220 Encounter for screening for lipoid disorders: Secondary | ICD-10-CM

## 2021-06-05 DIAGNOSIS — Z130 Encounter for screening for diseases of the blood and blood-forming organs and certain disorders involving the immune mechanism: Secondary | ICD-10-CM

## 2021-06-05 DIAGNOSIS — D259 Leiomyoma of uterus, unspecified: Secondary | ICD-10-CM

## 2021-06-05 DIAGNOSIS — Z1329 Encounter for screening for other suspected endocrine disorder: Secondary | ICD-10-CM

## 2021-06-05 DIAGNOSIS — E559 Vitamin D deficiency, unspecified: Secondary | ICD-10-CM | POA: Diagnosis not present

## 2021-06-05 DIAGNOSIS — R928 Other abnormal and inconclusive findings on diagnostic imaging of breast: Secondary | ICD-10-CM

## 2021-06-05 DIAGNOSIS — Z0000 Encounter for general adult medical examination without abnormal findings: Secondary | ICD-10-CM | POA: Diagnosis not present

## 2021-06-05 DIAGNOSIS — E669 Obesity, unspecified: Secondary | ICD-10-CM

## 2021-06-05 DIAGNOSIS — E1169 Type 2 diabetes mellitus with other specified complication: Secondary | ICD-10-CM

## 2021-06-05 DIAGNOSIS — E785 Hyperlipidemia, unspecified: Secondary | ICD-10-CM

## 2021-06-05 MED ORDER — LOSARTAN POTASSIUM 50 MG PO TABS: 50.0000 mg | ORAL_TABLET | Freq: Every day | ORAL | 3 refills | Status: DC

## 2021-06-05 NOTE — Patient Instructions (Addendum)
Ms. Sadowski , Thank you for taking time to come for your Annual Wellness Visit. I appreciate your ongoing commitment to your health goals. Please review the following plan we discussed and let me know if I can assist you in the future.   These are the goals we discussed:  Goals      Blood Pressure < 130/80     Fasting glucose <100     Weight (lb) < 225 lb (102.1 kg)        This is a list of the screening recommended for you and due dates:  Health Maintenance  Topic Date Due   Eye exam for diabetics  Never done   Zoster (Shingles) Vaccine (1 of 2) Never done   Urine Protein Check  04/02/2021   COVID-19 Vaccine (3 - Pfizer risk series) 06/21/2021*   Pneumococcal Vaccination (1 - PCV) 06/05/2022*   Pneumococcal vaccine  06/05/2022*   Flu Shot  06/24/2021   Hemoglobin A1C  08/03/2021   Complete foot exam   06/05/2022   Mammogram  09/05/2022   Pap Smear  05/02/2024   Tetanus Vaccine  09/12/2025   Colon Cancer Screening  06/25/2030   HPV Vaccine  Aged Out   Hepatitis C Screening: USPSTF Recommendation to screen - Ages 18-79 yo.  Discontinued   HIV Screening  Discontinued  *Topic was postponed. The date shown is not the original due date.     Know what a healthy weight is for you (roughly BMI <25) and aim to maintain this  Aim for 7+ servings of fruits and vegetables daily  65-80+ fluid ounces of water or unsweet tea for healthy kidneys  Limit to max 1 drink of alcohol per day; avoid smoking/tobacco  Limit animal fats in diet for cholesterol and heart health - choose grass fed whenever available  Avoid highly processed foods, and foods high in saturated/trans fats  Aim for low stress - take time to unwind and care for your mental health  Aim for 150 min of moderate intensity exercise weekly for heart health, and weights twice weekly for bone health  Aim for 7-9 hours of sleep daily     High-Fiber Eating Plan Fiber, also called dietary fiber, is a type of  carbohydrate. It is found foods such as fruits, vegetables, whole grains, and beans. A high-fiber diet can have many health benefits. Your health care provider may recommend a high-fiber diet to help: Prevent constipation. Fiber can make your bowel movements more regular. Lower your cholesterol. Relieve the following conditions: Inflammation of veins in the anus (hemorrhoids). Inflammation of specific areas of the digestive tract (uncomplicated diverticulosis). A problem of the large intestine, also called the colon, that sometimes causes pain and diarrhea (irritable bowel syndrome, or IBS). Prevent overeating as part of a weight-loss plan. Prevent heart disease, type 2 diabetes, and certain cancers. What are tips for following this plan? Reading food labels  Check the nutrition facts label on food products for the amount of dietary fiber. Choose foods that have 5 grams of fiber or more per serving. The goals for recommended daily fiber intake include: Men (age 52 or younger): 34-38 g. Men (over age 80): 28-34 g. Women (age 57 or younger): 25-28 g. Women (over age 59): 22-25 g. Your daily fiber goal is _____________ g. Shopping Choose whole fruits and vegetables instead of processed forms, such as apple juice or applesauce. Choose a wide variety of high-fiber foods such as avocados, lentils, oats, and kidney beans. Read the  nutrition facts label of the foods you choose. Be aware of foods with added fiber. These foods often have high sugar and sodium amounts per serving. Cooking Use whole-grain flour for baking and cooking. Cook with brown rice instead of white rice. Meal planning Start the day with a breakfast that is high in fiber, such as a cereal that contains 5 g of fiber or more per serving. Eat breads and cereals that are made with whole-grain flour instead of refined flour or white flour. Eat brown rice, bulgur wheat, or millet instead of white rice. Use beans in place of meat in  soups, salads, and pasta dishes. Be sure that half of the grains you eat each day are whole grains. General information You can get the recommended daily intake of dietary fiber by: Eating a variety of fruits, vegetables, grains, nuts, and beans. Taking a fiber supplement if you are not able to take in enough fiber in your diet. It is better to get fiber through food than from a supplement. Gradually increase how much fiber you consume. If you increase your intake of dietary fiber too quickly, you may have bloating, cramping, or gas. Drink plenty of water to help you digest fiber. Choose high-fiber snacks, such as berries, raw vegetables, nuts, and popcorn. What foods should I eat? Fruits Berries. Pears. Apples. Oranges. Avocado. Prunes and raisins. Dried figs. Vegetables Sweet potatoes. Spinach. Kale. Artichokes. Cabbage. Broccoli. Cauliflower.Green peas. Carrots. Squash. Grains Whole-grain breads. Multigrain cereal. Oats and oatmeal. Brown rice. Barley.Bulgur wheat. Virgie. Quinoa. Bran muffins. Popcorn. Rye wafer crackers. Meats and other proteins Navy beans, kidney beans, and pinto beans. Soybeans. Split peas. Lentils. Nutsand seeds. Dairy Fiber-fortified yogurt. Beverages Fiber-fortified soy milk. Fiber-fortified orange juice. Other foods Fiber bars. The items listed above may not be a complete list of recommended foods and beverages. Contact a dietitian for more information. What foods should I avoid? Fruits Fruit juice. Cooked, strained fruit. Vegetables Fried potatoes. Canned vegetables. Well-cooked vegetables. Grains White bread. Pasta made with refined flour. White rice. Meats and other proteins Fatty cuts of meat. Fried chicken or fried fish. Dairy Milk. Yogurt. Cream cheese. Sour cream. Fats and oils Butters. Beverages Soft drinks. Other foods Cakes and pastries. The items listed above may not be a complete list of foods and beverages to avoid. Talk with your  dietitian about what choices are best for you. Summary Fiber is a type of carbohydrate. It is found in foods such as fruits, vegetables, whole grains, and beans. A high-fiber diet has many benefits. It can help to prevent constipation, lower blood cholesterol, aid weight loss, and reduce your risk of heart disease, diabetes, and certain cancers. Increase your intake of fiber gradually. Increasing fiber too quickly may cause cramping, bloating, and gas. Drink plenty of water while you increase the amount of fiber you consume. The best sources of fiber include whole fruits and vegetables, whole grains, nuts, seeds, and beans. This information is not intended to replace advice given to you by your health care provider. Make sure you discuss any questions you have with your healthcare provider. Document Revised: 03/15/2020 Document Reviewed: 03/15/2020 Elsevier Patient Education  2022 [employer].

## 2021-06-06 ENCOUNTER — Other Ambulatory Visit: Payer: Self-pay | Admitting: Adult Health

## 2021-06-06 DIAGNOSIS — E559 Vitamin D deficiency, unspecified: Secondary | ICD-10-CM

## 2021-06-06 MED ORDER — VITAMIN D (ERGOCALCIFEROL) 1.25 MG (50000 UNIT) PO CAPS: ORAL_CAPSULE | ORAL | 3 refills | Status: DC

## 2021-06-07 ENCOUNTER — Encounter: Payer: Self-pay | Admitting: Adult Health

## 2021-06-07 ENCOUNTER — Other Ambulatory Visit: Payer: Self-pay | Admitting: Adult Health

## 2021-06-07 DIAGNOSIS — D509 Iron deficiency anemia, unspecified: Secondary | ICD-10-CM

## 2021-06-07 LAB — URINALYSIS, ROUTINE W REFLEX MICROSCOPIC
Bacteria, UA: NONE SEEN /HPF
Bilirubin Urine: NEGATIVE
Hyaline Cast: NONE SEEN /LPF
Ketones, ur: NEGATIVE
Leukocytes,Ua: NEGATIVE
Nitrite: NEGATIVE
Protein, ur: NEGATIVE
RBC / HPF: NONE SEEN /HPF (ref 0–2)
Specific Gravity, Urine: 1.022 (ref 1.001–1.035)
Squamous Epithelial / HPF: NONE SEEN /HPF (ref <=5)
pH: 6 (ref 5.0–8.0)

## 2021-06-07 LAB — CBC WITH DIFFERENTIAL/PLATELET
Absolute Monocytes: 642 cells/uL (ref 200–950)
Basophils Absolute: 41 cells/uL (ref 0–200)
Basophils Relative: 0.6 %
Eosinophils Absolute: 131 cells/uL (ref 15–500)
Eosinophils Relative: 1.9 %
HCT: 44.9 % (ref 35.0–45.0)
Hemoglobin: 14 g/dL (ref 11.7–15.5)
Lymphs Abs: 2981 cells/uL (ref 850–3900)
MCH: 24.5 pg — ABNORMAL LOW (ref 27.0–33.0)
MCHC: 31.2 g/dL — ABNORMAL LOW (ref 32.0–36.0)
MCV: 78.5 fL — ABNORMAL LOW (ref 80.0–100.0)
MPV: 13 fL — ABNORMAL HIGH (ref 7.5–12.5)
Monocytes Relative: 9.3 %
Neutro Abs: 3105 cells/uL (ref 1500–7800)
Neutrophils Relative %: 45 %
Platelets: 288 10*3/uL (ref 140–400)
RBC: 5.72 10*6/uL — ABNORMAL HIGH (ref 3.80–5.10)
RDW: 15.9 % — ABNORMAL HIGH (ref 11.0–15.0)
Total Lymphocyte: 43.2 %
WBC: 6.9 10*3/uL (ref 3.8–10.8)

## 2021-06-07 LAB — IRON,TIBC AND FERRITIN PANEL
%SAT: 7 % (calc) — ABNORMAL LOW (ref 16–45)
Ferritin: 6 ng/mL — ABNORMAL LOW (ref 16–232)
Iron: 28 ug/dL — ABNORMAL LOW (ref 45–160)
TIBC: 380 mcg/dL (calc) (ref 250–450)

## 2021-06-07 LAB — COMPLETE METABOLIC PANEL WITH GFR
AG Ratio: 1 (calc) (ref 1.0–2.5)
ALT: 15 U/L (ref 6–29)
AST: 17 U/L (ref 10–35)
Albumin: 3.7 g/dL (ref 3.6–5.1)
Alkaline phosphatase (APISO): 73 U/L (ref 37–153)
BUN: 16 mg/dL (ref 7–25)
CO2: 25 mmol/L (ref 20–32)
Calcium: 9 mg/dL (ref 8.6–10.4)
Chloride: 106 mmol/L (ref 98–110)
Creat: 0.75 mg/dL (ref 0.50–1.03)
Globulin: 3.7 g/dL (calc) (ref 1.9–3.7)
Glucose, Bld: 75 mg/dL (ref 65–99)
Potassium: 4.4 mmol/L (ref 3.5–5.3)
Sodium: 138 mmol/L (ref 135–146)
Total Bilirubin: 0.2 mg/dL (ref 0.2–1.2)
Total Protein: 7.4 g/dL (ref 6.1–8.1)
eGFR: 96 mL/min/{1.73_m2} (ref >=60)

## 2021-06-07 LAB — LIPID PANEL
Cholesterol: 199 mg/dL (ref <200)
HDL: 56 mg/dL (ref >=50)
LDL Cholesterol (Calc): 125 mg/dL (calc) — ABNORMAL HIGH
Non-HDL Cholesterol (Calc): 143 mg/dL (calc) — ABNORMAL HIGH (ref <130)
Total CHOL/HDL Ratio: 3.6 (calc) (ref <5.0)
Triglycerides: 79 mg/dL (ref <150)

## 2021-06-07 LAB — MICROALBUMIN / CREATININE URINE RATIO
Creatinine, Urine: 84 mg/dL (ref 20–275)
Microalb Creat Ratio: 8 mcg/mg creat (ref <30)
Microalb, Ur: 0.7 mg/dL

## 2021-06-07 LAB — HEMOGLOBIN A1C
Hgb A1c MFr Bld: 6.1 % of total Hgb — ABNORMAL HIGH (ref <5.7)
Mean Plasma Glucose: 128 mg/dL
eAG (mmol/L): 7.1 mmol/L

## 2021-06-07 LAB — MAGNESIUM: Magnesium: 2.1 mg/dL (ref 1.5–2.5)

## 2021-06-07 LAB — TEST AUTHORIZATION

## 2021-06-07 LAB — VITAMIN B12: Vitamin B-12: 471 pg/mL (ref 200–1100)

## 2021-06-07 LAB — VITAMIN D 25 HYDROXY (VIT D DEFICIENCY, FRACTURES): Vit D, 25-Hydroxy: 122 ng/mL — ABNORMAL HIGH (ref 30–100)

## 2021-06-07 LAB — TSH: TSH: 2.03 mIU/L

## 2021-07-08 ENCOUNTER — Other Ambulatory Visit: Payer: Self-pay

## 2021-07-08 ENCOUNTER — Ambulatory Visit (INDEPENDENT_AMBULATORY_CARE_PROVIDER_SITE_OTHER): Payer: Managed Care, Other (non HMO)

## 2021-07-08 DIAGNOSIS — Z79899 Other long term (current) drug therapy: Secondary | ICD-10-CM

## 2021-07-08 DIAGNOSIS — I10 Essential (primary) hypertension: Secondary | ICD-10-CM

## 2021-07-08 LAB — BASIC METABOLIC PANEL WITH GFR
BUN: 19 mg/dL (ref 7–25)
CO2: 26 mmol/L (ref 20–32)
Calcium: 9 mg/dL (ref 8.6–10.4)
Chloride: 105 mmol/L (ref 98–110)
Creat: 0.66 mg/dL (ref 0.50–1.03)
Glucose, Bld: 84 mg/dL (ref 65–99)
Potassium: 4.3 mmol/L (ref 3.5–5.3)
Sodium: 137 mmol/L (ref 135–146)
eGFR: 106 mL/min/{1.73_m2} (ref >=60)

## 2021-07-08 NOTE — Progress Notes (Signed)
Patient presents today for NV and to have Labs drawn. BMP with GFR. Patient started taking iron '325mg'$  daily.

## 2021-07-11 ENCOUNTER — Other Ambulatory Visit: Payer: Self-pay | Admitting: Adult Health

## 2021-07-11 MED ORDER — LOSARTAN POTASSIUM-HCTZ 100-25 MG PO TABS: ORAL_TABLET | ORAL | 0 refills | Status: DC

## 2021-07-11 MED ORDER — LOSARTAN POTASSIUM-HCTZ 100-25 MG PO TABS: 1.0000 | ORAL_TABLET | Freq: Every day | ORAL | 0 refills | Status: DC

## 2021-08-05 ENCOUNTER — Other Ambulatory Visit: Payer: Self-pay

## 2021-08-05 DIAGNOSIS — D509 Iron deficiency anemia, unspecified: Secondary | ICD-10-CM

## 2021-08-05 LAB — POC HEMOCCULT BLD/STL (HOME/3-CARD/SCREEN)
Card #2 Fecal Occult Blod, POC: NEGATIVE
Card #3 Fecal Occult Blood, POC: NEGATIVE
Fecal Occult Blood, POC: NEGATIVE

## 2021-08-07 DIAGNOSIS — Z1211 Encounter for screening for malignant neoplasm of colon: Secondary | ICD-10-CM | POA: Diagnosis not present

## 2021-08-22 ENCOUNTER — Other Ambulatory Visit: Payer: Self-pay | Admitting: Obstetrics and Gynecology

## 2021-08-22 ENCOUNTER — Other Ambulatory Visit: Payer: Self-pay | Admitting: Adult Health Nurse Practitioner

## 2021-08-22 DIAGNOSIS — D250 Submucous leiomyoma of uterus: Secondary | ICD-10-CM

## 2021-08-22 DIAGNOSIS — E1169 Type 2 diabetes mellitus with other specified complication: Secondary | ICD-10-CM

## 2021-09-06 NOTE — Progress Notes (Signed)
3 MONTH FOLLOW UP  Assessment and Plan:   Diagnoses and all orders for this visit:  Benign labile hypertension Continue meds Monitor blood pressure at home; call if consistently over 130/80 Discussed DASH diet Advised to go to the ER if any CP, SOB, nausea, dizziness, severe HA, changes vision/speech, left arm numbness and tingling and jaw pain  Hyperlipidemia associated with type 2 diabetes mellitus (HCC) Mild elevations, patient preference to work aggressively on lifestyle to reverse diabetes, will hold off on med for now and continue close follow up for progress monitoring LDL goal <70 Reviewed low saturated fat, high fiber diet Regular exercise encouraged Check lipid panel.  -     Lipid panel -     TSH  Type 2 diabetes mellitus with hyperlipidemia [facility]) Education: Reviewed 'ABCs' of diabetes management (respective goals in parentheses):  A1C (<7), blood pressure (<130/80), and cholesterol (LDL <70) Eye Exam yearly and Dental Exam every 6 months. Dietary recommendations Physical Activity recommendations -     COMPLETE METABOLIC PANEL WITH GFR -     Hemoglobin A1c  B12 deficiency Continue supplement   Medication management -     CBC with Differential/Platelet -     COMPLETE METABOLIC PANEL WITH GFR -     Magnesium  Morbid obesity (HCC) - BMI 35+ with htn, hld, T2DM Long discussion about weight loss, diet, and exercise Recommended diet heavy in fruits and veggies and low in animal meats, cheeses, and dairy products, appropriate calorie intake Patient will work on portions, reduce processed carb Discussed appropriate weight for height and initial goal (225 lb) Follow up at next visit in 3 months   Vitamin D deficiency Continue supplement as indicated by labs;  Defer recheck today per patient preference, cost concern hold vitamin D 1 month then restart once weekly supplment   Discussed med's effects and SE's. Labs and tests as requested with regular follow-up as  recommended. Over 30 minutes of exam, counseling, chart review and critical decision making was performed  Future Appointments  Date Time Provider [place]  09/11/2021  1:00 PM [facility]  12/10/2021  3:30 PM Liane Comber, NP [facility] None  06/05/2022 10:00 AM Liane Comber, NP [facility] None    HPI  This very nice 50 y.o. AA female presents for 3 month follow up.  She has Morbid obesity ([place])- BMI 35+ with htn, T2DM; Vitamin D deficiency; B12 deficiency; Uterine leiomyoma; Medication management; Benign labile hypertension; Type 2 diabetes mellitus with hyperlipidemia ([place]); Hyperlipidemia associated with type 2 diabetes mellitus ([place]); Abnormal mammogram; and Iron deficiency anemia on their problem list.  She follows with GYN Dr. Garwin Brothers, very large uterine leioma "18-20 week uterus." Has follow up tomorrow, discussing resection due to size.   Abnormal mammogram 07/2020, right upper outer breast cluster calcification, reports had again in March 06 2021 (see in care everywhere) and was stable.   BMI is Body mass index is 34.76 kg/m., she has been working on diet and exercise but admits needs to do better. Has cut out most rice/pasta.  Hasn't been in gym but planning.  Very rare soda/sweet tea, mostly water.  Wt Readings from Last 3 Encounters:  09/10/21 232 lb (105.2 kg)  07/08/21 237 lb (107.5 kg)  06/05/21 234 lb 6.4 oz (106.3 kg)   She has not been checking BP at home, today their BP is BP: 120/80, similar by manual recheck.   She does not workout. She denies chest pain, shortness of breath, dizziness.  She is not on cholesterol medication, strong patient preference to avoid, newly taking red yeast rice. Her cholesterol is not at goal. The cholesterol last visit was:   Lab Results  Component Value Date   CHOL 199 06/05/2021   HDL 56 06/05/2021   LDLCALC 125 (H) 06/05/2021   TRIG 79 06/05/2021   CHOLHDL 3.6 06/05/2021    She has been working  on diet and exercise for T2 diabetes (on jardiance, patient preference to minimize pills, stopped metformin), and denies foot ulcerations, hyperglycemia, hypoglycemia , increased appetite, nausea, paresthesia of the feet, polydipsia, polyuria, and visual disturbances.  She does check fasting glucose, ranges 120-138, up from previous.  Last A1C in the office was:  Lab Results  Component Value Date   HGBA1C 6.1 (H) 06/05/2021    Last GFR:  Lab Results  Component Value Date   GFRAA 105 01/31/2021   Patient is on Vitamin D supplement, reduced from from 50000 IU twice a week down to once weekly   Lab Results  Component Value Date   VD25OH 122 (H) 06/05/2021     She takes B12 supplement regularly Lab Results  Component Value Date   VITAMINB12 471 06/05/2021   In 06/05/2021 we found low iron/ferritin with no overt anemia, did have hemoccult x 3 which was negative. She does continue to have menstrual cycles, recently with heavy flow, does seem to be improving. Follows with GYN.  Lab Results  Component Value Date   IRON 28 (L) 06/05/2021   TIBC 380 06/05/2021   FERRITIN 6 (L) 06/05/2021   CBC Latest Ref Rng & Units 06/05/2021 01/31/2021 10/16/2020  WBC 3.8 - 10.8 Thousand/uL 6.9 7.5 7.4  Hemoglobin 11.7 - 15.5 g/dL 14.0 13.9 14.8  Hematocrit 35.0 - 45.0 % 44.9 42.9 46.2(H)  Platelets 140 - 400 Thousand/uL 288 251 260      Current Medications:  Current Outpatient Medications on File Prior to Visit  Medication Sig Dispense Refill   Cyanocobalamin (VITAMIN B12 PO) Take by mouth daily. Takes 3-4 tablets twice a week     ferrous sulfate 325 (65 FE) MG tablet Take 325 mg by mouth daily with breakfast.     JARDIANCE 25 MG TABS tablet TAKE 1 TABLET DAILY 90 tablet 3   Latanoprost 0.005 % EMUL Place 0.005 drops into both eyes at bedtime.     losartan-hydrochlorothiazide (HYZAAR) 100-25 MG tablet Take 1 tab daily for blood pressure goal <130/80. 90 tablet 0   Multiple Vitamin (MULTIVITAMIN)  tablet Take 1 tablet by mouth daily.     Red Yeast Rice Extract (RED YEAST RICE PO) Take by mouth daily.     timolol (TIMOPTIC) 0.5 % ophthalmic solution Place 0.5 drops into both eyes 2 (two) times daily.     Vitamin D, Ergocalciferol, (DRISDOL) 1.25 MG (50000 UNIT) CAPS capsule TAKE 1 CAPSULE 1 TIME WEEKLY 13 capsule 3   No current facility-administered medications on file prior to visit.    Medical History:  Past Medical History:  Diagnosis Date   Asthma    childhood   Glaucoma    Hypertension    Uterine fibroid    Allergies No Known Allergies  SURGICAL HISTORY She  has a past surgical history that includes Wisdom tooth extraction. FAMILY HISTORY Her family history includes Diabetes in her father, maternal grandmother, and mother; Hypertension in her father and mother. SOCIAL HISTORY She  reports that she has never smoked. She has never used smokeless tobacco. She reports current alcohol use.  She reports that she does not use drugs.   Review of Systems: Review of Systems  Constitutional: Negative.  Negative for malaise/fatigue and weight loss.  HENT: Negative.  Negative for hearing loss and tinnitus.   Eyes: Negative.  Negative for blurred vision and double vision.  Respiratory: Negative.  Negative for cough, shortness of breath and wheezing.   Cardiovascular: Negative.  Negative for chest pain, palpitations, orthopnea, claudication and leg swelling.  Gastrointestinal: Negative.  Negative for abdominal pain, blood in stool, constipation, diarrhea, heartburn, melena, nausea and vomiting.  Genitourinary: Negative.   Musculoskeletal: Negative.  Negative for joint pain and myalgias.  Skin:  Negative for itching and rash.  Neurological: Negative.  Negative for dizziness, tingling, sensory change, weakness and headaches.  Endo/Heme/Allergies: Negative.  Negative for polydipsia.  Psychiatric/Behavioral: Negative.    All other systems reviewed and are negative.  Physical  Exam: Estimated body mass index is 34.76 kg/m as calculated from the following:   Height as of 06/05/21: 5' 8.5" (1.74 m).   Weight as of this encounter: 232 lb (105.2 kg). BP 120/80   Pulse 60   Temp (!) 97.5 F (36.4 C)   Wt 232 lb (105.2 kg)   SpO2 99%   BMI 34.76 kg/m  General Appearance: Well nourished, in no apparent distress.  Eyes: PERRLA, EOMs, conjunctiva no swelling or erythema Sinuses: No Frontal/maxillary tenderness  ENT/Mouth: Ext aud canals clear, normal light reflex with TMs without erythema, bulging. Good dentition. No erythema, swelling, or exudate on post pharynx. Tonsils not swollen or erythematous. Hearing normal.  Neck: Supple, thyroid normal. No bruits  Respiratory: Respiratory effort normal, BS equal bilaterally without rales, rhonchi, wheezing or stridor.  Cardio: RRR without murmurs, rubs or gallops. Brisk peripheral pulses without edema.  Chest: symmetric, with normal excursions and percussion.  Abdomen: Soft, nontender, + fixed enlarged uterus, nontender, rebound, hernias, masses, or organomegaly.  Lymphatics: Non tender without lymphadenopathy.  Musculoskeletal: Full ROM all peripheral extremities,5/5 strength, and normal gait.  Skin: Warm, dry without rashes, lesions, ecchymosis. Neuro: Cranial nerves intact, reflexes equal bilaterally. Normal muscle tone, no cerebellar symptoms. Sensation intact.  Psych: Awake and oriented X 3, normal affect, Insight and Judgment appropriate.   Izora Ribas, NP 3:39 PM [facility]

## 2021-09-10 ENCOUNTER — Other Ambulatory Visit: Payer: Self-pay

## 2021-09-10 ENCOUNTER — Encounter: Payer: Self-pay | Admitting: Adult Health

## 2021-09-10 ENCOUNTER — Ambulatory Visit (INDEPENDENT_AMBULATORY_CARE_PROVIDER_SITE_OTHER): Payer: Managed Care, Other (non HMO) | Admitting: Adult Health

## 2021-09-10 VITALS — BP 120/80 | HR 60 | Temp 97.5°F | Wt 232.0 lb

## 2021-09-10 DIAGNOSIS — D509 Iron deficiency anemia, unspecified: Secondary | ICD-10-CM

## 2021-09-10 DIAGNOSIS — E1169 Type 2 diabetes mellitus with other specified complication: Secondary | ICD-10-CM

## 2021-09-10 DIAGNOSIS — E782 Mixed hyperlipidemia: Secondary | ICD-10-CM

## 2021-09-10 DIAGNOSIS — Z23 Encounter for immunization: Secondary | ICD-10-CM

## 2021-09-10 DIAGNOSIS — I10 Essential (primary) hypertension: Secondary | ICD-10-CM | POA: Diagnosis not present

## 2021-09-10 DIAGNOSIS — Z79899 Other long term (current) drug therapy: Secondary | ICD-10-CM

## 2021-09-10 DIAGNOSIS — E785 Hyperlipidemia, unspecified: Secondary | ICD-10-CM

## 2021-09-10 DIAGNOSIS — R928 Other abnormal and inconclusive findings on diagnostic imaging of breast: Secondary | ICD-10-CM

## 2021-09-10 DIAGNOSIS — E559 Vitamin D deficiency, unspecified: Secondary | ICD-10-CM

## 2021-09-11 ENCOUNTER — Encounter: Payer: Self-pay | Admitting: *Deleted

## 2021-09-11 ENCOUNTER — Ambulatory Visit
Admission: RE | Admit: 2021-09-11 | Discharge: 2021-09-11 | Disposition: A | Discharge status: Home / Self Care | Payer: Managed Care, Other (non HMO) | Source: Ambulatory Visit | Attending: Obstetrics and Gynecology | Admitting: Obstetrics and Gynecology

## 2021-09-11 ENCOUNTER — Other Ambulatory Visit: Payer: Self-pay | Admitting: Adult Health

## 2021-09-11 DIAGNOSIS — R921 Mammographic calcification found on diagnostic imaging of breast: Secondary | ICD-10-CM

## 2021-09-11 DIAGNOSIS — D250 Submucous leiomyoma of uterus: Secondary | ICD-10-CM

## 2021-09-11 HISTORY — PX: IR RADIOLOGIST EVAL & MGMT: IMG5224

## 2021-09-11 LAB — CBC WITH DIFFERENTIAL/PLATELET
Absolute Monocytes: 570 cells/uL (ref 200–950)
Basophils Absolute: 39 cells/uL (ref 0–200)
Basophils Relative: 0.5 %
Eosinophils Absolute: 139 cells/uL (ref 15–500)
Eosinophils Relative: 1.8 %
HCT: 50.2 % — ABNORMAL HIGH (ref 35.0–45.0)
Hemoglobin: 16.4 g/dL — ABNORMAL HIGH (ref 11.7–15.5)
Lymphs Abs: 3249 cells/uL (ref 850–3900)
MCH: 25.9 pg — ABNORMAL LOW (ref 27.0–33.0)
MCHC: 32.7 g/dL (ref 32.0–36.0)
MCV: 79.4 fL — ABNORMAL LOW (ref 80.0–100.0)
MPV: 12.3 fL (ref 7.5–12.5)
Monocytes Relative: 7.4 %
Neutro Abs: 3704 cells/uL (ref 1500–7800)
Neutrophils Relative %: 48.1 %
Platelets: 272 10*3/uL (ref 140–400)
RBC: 6.32 10*6/uL — ABNORMAL HIGH (ref 3.80–5.10)
RDW: 16.7 % — ABNORMAL HIGH (ref 11.0–15.0)
Total Lymphocyte: 42.2 %
WBC: 7.7 10*3/uL (ref 3.8–10.8)

## 2021-09-11 LAB — IRON,TIBC AND FERRITIN PANEL
%SAT: 13 % (calc) — ABNORMAL LOW (ref 16–45)
Ferritin: 24 ng/mL (ref 16–232)
Iron: 47 ug/dL (ref 45–160)
TIBC: 374 mcg/dL (calc) (ref 250–450)

## 2021-09-11 LAB — HEMOGLOBIN A1C
Hgb A1c MFr Bld: 6.6 % of total Hgb — ABNORMAL HIGH (ref <5.7)
Mean Plasma Glucose: 143 mg/dL
eAG (mmol/L): 7.9 mmol/L

## 2021-09-11 LAB — LIPID PANEL
Cholesterol: 225 mg/dL — ABNORMAL HIGH (ref <200)
HDL: 55 mg/dL (ref >=50)
LDL Cholesterol (Calc): 149 mg/dL (calc) — ABNORMAL HIGH
Non-HDL Cholesterol (Calc): 170 mg/dL (calc) — ABNORMAL HIGH (ref <130)
Total CHOL/HDL Ratio: 4.1 (calc) (ref <5.0)
Triglycerides: 99 mg/dL (ref <150)

## 2021-09-11 LAB — COMPLETE METABOLIC PANEL WITH GFR
AG Ratio: 1 (calc) (ref 1.0–2.5)
ALT: 17 U/L (ref 6–29)
AST: 16 U/L (ref 10–35)
Albumin: 4 g/dL (ref 3.6–5.1)
Alkaline phosphatase (APISO): 65 U/L (ref 37–153)
BUN: 17 mg/dL (ref 7–25)
CO2: 31 mmol/L (ref 20–32)
Calcium: 9.8 mg/dL (ref 8.6–10.4)
Chloride: 101 mmol/L (ref 98–110)
Creat: 0.85 mg/dL (ref 0.50–1.03)
Globulin: 4.2 g/dL (calc) — ABNORMAL HIGH (ref 1.9–3.7)
Glucose, Bld: 87 mg/dL (ref 65–99)
Potassium: 3.9 mmol/L (ref 3.5–5.3)
Sodium: 139 mmol/L (ref 135–146)
Total Bilirubin: 0.4 mg/dL (ref 0.2–1.2)
Total Protein: 8.2 g/dL — ABNORMAL HIGH (ref 6.1–8.1)
eGFR: 83 mL/min/{1.73_m2} (ref >=60)

## 2021-09-11 LAB — TSH: TSH: 1.75 mIU/L

## 2021-09-11 LAB — HM MAMMOGRAPHY

## 2021-09-11 LAB — MAGNESIUM: Magnesium: 2.2 mg/dL (ref 1.5–2.5)

## 2021-09-11 NOTE — Progress Notes (Signed)
Chief Complaint: Abnormal uterine bleeding in the setting of uterine fibroids  Referring Physician(s): Cousins,Sheronette  History of Present Illness: Sarah Warren is a 50 y.o. G2P1 female presenting as a gracious referral from Dr. Garwin Brothers for abnormal uterine bleeding in the setting of uterine fibroids.  Recent physical examination by Dr. Garwin Brothers describes 18-[redacted] week gestational size of uterus.  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    She states that in the summer of 2020 she began to have extremely heavy periods that lasted 5-6 days with 2 days of heavy bleeding.  This has improved slightly, and last month she only had some spotting.  The periods are regular.  She also endorses abdominal bloating that affects her lifestyle, including ability to exercise.  She endorses urinary frequency, but no significant constipation, dyspareunia, or post-coital ache.  She was once prescribed progesterone for her abnormal bleeding, but did not take it due to fear of associated side effects.   She has one child who is in his 13s.  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    Past Medical History:  Diagnosis Date   Asthma    childhood   Glaucoma    Hypertension    Uterine fibroid     Past Surgical History:  Procedure Laterality Date   WISDOM TOOTH EXTRACTION      Allergies: Patient has no known allergies.  Medications: Prior to Admission medications   Medication Sig Start Date End Date Taking? Authorizing Provider  Cyanocobalamin (VITAMIN B12 PO) Take by mouth daily. Takes 3-4 tablets twice a week    [provider]  ferrous sulfate 325 (65 FE) MG tablet Take 325 mg by mouth daily with breakfast.    [provider]  JARDIANCE 25 MG TABS tablet TAKE 1 TABLET DAILY 08/22/21   Liane Comber, NP  Latanoprost 0.005 % EMUL Place 0.005 drops into both eyes at bedtime.     [provider]  losartan-hydrochlorothiazide (HYZAAR) 100-25 MG tablet Take 1 tab daily for blood pressure goal <130/80. 07/11/21   Liane Comber, NP  Multiple Vitamin (MULTIVITAMIN) tablet Take 1 tablet by mouth daily.    [provider]  Red Yeast Rice Extract (RED YEAST RICE PO) Take by mouth daily.    [provider]  timolol (TIMOPTIC) 0.5 % ophthalmic solution Place 0.5 drops into both eyes 2 (two) times daily.    [provider]  Vitamin D, Ergocalciferol, (DRISDOL) 1.25 MG (50000 UNIT) CAPS capsule TAKE 1 CAPSULE 1 TIME WEEKLY 06/06/21   Liane Comber, NP     Family History  Problem Relation Age of Onset   Hypertension Mother    Diabetes Mother    Hypertension Father    Diabetes Father    Diabetes Maternal Grandmother    Crohn's disease Neg Hx    Colon polyps Neg Hx    Esophageal cancer Neg Hx    Rectal cancer Neg Hx    Stomach cancer Neg Hx     Social History   Socioeconomic History   Marital status: Single    Spouse name: Not on file   Number of children: 1   Years of education: Not on file   Highest education level: Not on file  Occupational History   Occupation: Purchasing  Tobacco Use   Smoking status: Never   Smokeless tobacco: Never  Vaping Use   Vaping Use: Never used  Substance and  Sexual Activity   Alcohol use: Yes    Comment: occasional wine   Drug use: No   Sexual activity: Yes    Partners: Male    Birth control/protection: None  Other Topics Concern   Not on file  Social History Narrative   Not on file   Social Determinants of Health   Financial Resource Strain: Not on file  Food Insecurity: Not on file  Transportation Needs: Not on file  Physical Activity: Not on file  Stress: Not on file  Social Connections: Not on file    Review of Systems: A 12 point ROS discussed and pertinent positives are indicated in the HPI above.  All other systems are negative.   Vital Signs: There were no vitals  taken for this visit.  Physical Exam Constitutional:      General: She is not in acute distress. HENT:     Head: Normocephalic.     Mouth/Throat:     Mouth: Mucous membranes are moist.  Cardiovascular:     Rate and Rhythm: Normal rate.  Pulmonary:     Effort: Pulmonary effort is normal.  Abdominal:     General: There is no distension.  Musculoskeletal:        General: No swelling.  Skin:    General: Skin is warm and dry.  Neurological:     Mental Status: She is alert and oriented to person, place, and time.    Imaging: US Pelvis 06/09/2017 FINDINGS: Uterus Measurements: 17.2 x 6.7 x 12.9 cm. Multiple fibroids are present (approximate 6 total). Largest fibroid positioned near the uterine fundus and measured 6.7 x 6.7 x 8.4 cm. Additional fundal fibroids measured 4.1 x 2.8 x 3.9 cm and 4.2 x 3.4 x 4.1 cm. Three additional fibroids positioned at the mid uterine body measured 3.5 x 3.8 x 3.5 cm, 2.2 x 2.6 x 3.2 cm, and 4.3 x 4.7 x 4.2 cm.  Endometrium: Not well seen due to overlying fibroids.  Right ovary: Not visualized. No adnexal mass.  Left ovary: Not visualized. No adnexal mass.  Other findings: No abnormal free fluid.  IMPRESSION: 1. Enlarged fibroid uterus as above. 2. Nonvisualization of the ovaries. No adnexal mass or other acute abnormality.     Labs:  Pap smear 05/08/21 - negative  CBC: Recent Labs    10/16/20 1643 01/31/21 1643 06/05/21 1107 09/10/21 1602  WBC 7.4 7.5 6.9 7.7  HGB 14.8 13.9 14.0 16.4*  HCT 46.2* 42.9 44.9 50.2*  PLT 260 251 288 272    COAGS: No results for input(s): INR, APTT in the last 8760 hours.  BMP: Recent Labs    10/16/20 1643 01/31/21 1643 06/05/21 1107 07/08/21 1652 09/10/21 1602  NA 138 139 138 137 139  K 4.0 4.3 4.4 4.3 3.9  CL 104 107 106 105 101  CO2 27 24 25 26 31   GLUCOSE 83 76 75 84 87  BUN 11 13 16 19 17   CALCIUM 9.3 9.1 9.0 9.0 9.8  CREATININE 0.72 0.76 0.75 0.66 0.85  GFRNONAA 98 91  --   --    --   GFRAA 113 105  --   --   --     LIVER FUNCTION TESTS: Recent Labs    10/16/20 1643 01/31/21 1643 06/05/21 1107 09/10/21 1602  BILITOT 0.4 0.3 0.2 0.4  AST 14 15 17 16   ALT 12 14 15 17   PROT 7.6 7.2 7.4 8.2*    TUMOR MARKERS: No results for input(s): AFPTM, CEA,  CA199, CHROMGRNA in the last 8760 hours.    Assessment: Sarah Warren is a 50 y.o. female presenting to IR clinic for discussion of symptomatic uterine fibroids.  Her primary symptoms are heavy menstrual cycles and mild bulk symptoms, and she has previously tried no other treatments.  The patient was counseled on options for treatment of uterine fibroids with their accompanying symptoms of heavy menses, painful periods, and/or bulk symptoms including doing nothing, hormonal therapy, myomectomy, hysterectomy, and uterine artery embolization.  We discussed uterine artery embolization ([place]) as a potential therapy.  We described the procedure itself, including the possibility of using analgesia for pain control, a bladder catheter, and admission to the hospital for a 23 hour inpatient observation period.  The procedure is done under conscious sedation.  We quoted an efficacy of 90% for improvement of menorrhagia and of 75% for improvement of bulk symptoms at one year related to fibroids.  We informed the patient that approximately 80% of those patients described sustained benefits from the procedure at 5 years.  We discussed secondary hysterectomy rates for persistent symptoms of 3%, 4%, 10%, and 17% at 1, 2, 3, and 5 years, respectively.  IF the patient also had adenomyosis as a potential contributing cause for her symptoms, we quoted a higher rate of recurrent symptoms after [place], being 30-50% at 3-5 years.    We also quoted a 1 in 250-500 (0.2-0.4%) incidence of requiring emergent or semi-emergent hysterectomy due a procedural complication, as well an as approximately 7% chance of early onset menopause, mostly in women >74  years of age, with a less than 1% risk for women less than 67 years of age.  We discussed the need to evaluate the ovarian arteries and in some cases the need to treat the fibroids via the ovarian arteries which can lead to a higher risk of early menopause. We also discussed the risk of an occult malignancy in patients with symptomatic fibroids being anywhere from 1 in 350 to 1 in 8,000 (0.0125-0.3%), depending upon the patient's age, symptom complex, family risk factors, and appearance of the fibroid on her MRI study.  We also briefly discussed surgical options of treatment offered by our Gynecology colleagues with hysterectomy, myomectomy and/or endometrial ablation.    After her visit today she is most interested in uterine artery embolization.    Plan: Obtain MRI Pelvis with contrast. Plan to tentatively proceed with uterine artery embolization at [facility]   Sedation plan:  Moderate Planned access site:  Left radial artery Contrast premedication: Not required Labs prior to procedure: None Post-procedure observation plan: Same day discharge home Medications to be held prior to procedure: None Immediately prior to procedure: Ancef 2 g IV, decadron 10 mg IV, Zofran 8 mg IV, Oxycodone 10 mg ER PO, Tylenol 1 g PO, insert Foley catheter       Thank you for this interesting consult.  I greatly enjoyed meeting Chellsea Beckers and look forward to participating in their care.  A copy of this report was sent to the requesting provider on this date.  Electronically Signed: Suzette Battiest, MD 09/11/2021, 10:34 AM   I spent a total of  40 Minutes  in face to face in clinical consultation, greater than 50% of which was counseling/coordinating care for symptomatic uterine fibroids.

## 2021-09-13 ENCOUNTER — Other Ambulatory Visit: Payer: Self-pay | Admitting: *Deleted

## 2021-09-13 DIAGNOSIS — D250 Submucous leiomyoma of uterus: Secondary | ICD-10-CM

## 2021-09-16 ENCOUNTER — Encounter: Payer: Self-pay | Admitting: Internal Medicine

## 2021-09-25 ENCOUNTER — Encounter: Payer: Self-pay | Admitting: Internal Medicine

## 2021-10-01 ENCOUNTER — Other Ambulatory Visit: Payer: Self-pay | Admitting: Adult Health

## 2021-10-07 LAB — HM DIABETES EYE EXAM

## 2021-10-15 ENCOUNTER — Other Ambulatory Visit: Payer: Self-pay | Admitting: Interventional Radiology

## 2021-10-15 DIAGNOSIS — D250 Submucous leiomyoma of uterus: Secondary | ICD-10-CM

## 2021-10-16 ENCOUNTER — Encounter: Payer: Self-pay | Admitting: *Deleted

## 2021-10-16 ENCOUNTER — Ambulatory Visit
Admission: RE | Admit: 2021-10-16 | Discharge: 2021-10-16 | Disposition: A | Discharge status: Home / Self Care | Payer: Managed Care, Other (non HMO) | Source: Ambulatory Visit | Attending: Interventional Radiology | Admitting: Interventional Radiology

## 2021-10-16 DIAGNOSIS — D250 Submucous leiomyoma of uterus: Secondary | ICD-10-CM

## 2021-10-16 HISTORY — PX: IR RADIOLOGIST EVAL & MGMT: IMG5224

## 2021-10-16 NOTE — Progress Notes (Signed)
Referring Physician(s): Servando Salina, MD  Reason for follow up: Review MRI results  History of present illness: Prior HPI: "Sarah Warren is a 50 y.o. G2P1 female presenting as a gracious referral from Dr. Garwin Brothers for abnormal uterine bleeding in the setting of uterine fibroids.  Recent physical examination by Dr. Garwin Brothers describes 18-[redacted] week gestational size of uterus.  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     She states that in the summer of 2020 she began to have extremely heavy periods that lasted 5-6 days with 2 days of heavy bleeding.  This has improved slightly, and last month she only had some spotting.  The periods are regular.  She also endorses abdominal bloating that affects her lifestyle, including ability to exercise.  She endorses urinary frequency, but no significant constipation, dyspareunia, or post-coital ache.  She was once prescribed progesterone for her abnormal bleeding, but did not take it due to fear of associated side effects.    She has one child who is in his 76s.  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  No other changes in health.  MRI pelvis was performed on 10/09/21 at [facility].   Past Medical History:  Diagnosis Date   Asthma    childhood   Glaucoma    Hypertension    Uterine fibroid     Past Surgical History:  Procedure Laterality Date   IR RADIOLOGIST EVAL & MGMT  09/11/2021   IR RADIOLOGIST EVAL & MGMT  10/16/2021   WISDOM TOOTH EXTRACTION      Allergies: Patient has no known allergies.  Medications: Prior to Admission medications   Medication Sig Start Date End Date Taking? Authorizing Provider  Cyanocobalamin (VITAMIN B12 PO) Take by mouth daily. Takes 3-4 tablets twice a week    [provider]  ferrous sulfate 325 (65 FE) MG tablet Take 325 mg by mouth daily with breakfast.    [provider]  JARDIANCE 25 MG TABS tablet TAKE 1 TABLET DAILY 08/22/21   Liane Comber, NP  Latanoprost 0.005 % EMUL Place 0.005 drops into both eyes at bedtime.    [provider]  losartan-hydrochlorothiazide (HYZAAR) 100-25 MG tablet TAKE 1 TABLET DAILY 10/02/21   Liane Comber, NP  Multiple Vitamin (MULTIVITAMIN) tablet Take 1 tablet by mouth daily.    [provider]  Red Yeast Rice Extract (RED YEAST RICE PO) Take by mouth daily.    [provider]  timolol (TIMOPTIC) 0.5 % ophthalmic solution Place 0.5 drops into both eyes 2 (two) times daily.    [provider]  Vitamin D, Ergocalciferol, (DRISDOL) 1.25 MG (50000 UNIT) CAPS capsule TAKE 1 CAPSULE 1 TIME WEEKLY 06/06/21   Liane Comber, NP     Family History  Problem Relation Age of Onset   Hypertension Mother    Diabetes Mother    Hypertension Father    Diabetes Father    Diabetes Maternal Grandmother    Crohn's disease Neg Hx    Colon polyps Neg Hx    Esophageal cancer Neg Hx    Rectal cancer Neg Hx    Stomach cancer Neg Hx     Social History   Socioeconomic History   Marital status: Single    Spouse name: Not on  file   Number of children: 1   Years of education: Not on file   Highest education level: Not on file  Occupational History   Occupation: Purchasing  Tobacco Use   Smoking status: Never   Smokeless tobacco: Never  Vaping Use   Vaping Use: Never used  Substance and Sexual Activity   Alcohol use: Yes    Comment: occasional wine   Drug use: No   Sexual activity: Yes    Partners: Male    Birth control/protection: None  Other Topics Concern   Not on file  Social History Narrative   Not on file   Social Determinants of Health   Financial Resource Strain: Not on file  Food Insecurity: Not on file  Transportation Needs: Not on file  Physical Activity:  Not on file  Stress: Not on file  Social Connections: Not on file     Vital Signs: BP (!) 144/87 (BP Location: Right Arm)   Pulse 61   SpO2 99%   Physical Exam Constitutional:      General: She is not in acute distress. HENT:     Head: Normocephalic.     Mouth/Throat:     Mouth: Mucous membranes are moist.  Cardiovascular:     Rate and Rhythm: Normal rate and regular rhythm.  Pulmonary:     Breath sounds: Normal breath sounds.  Abdominal:     General: There is no distension.  Musculoskeletal:        General: No swelling.  Skin:    General: Skin is warm and dry.  Neurological:     Mental Status: She is alert and oriented to person, place, and time.    Imaging: MRI Pelvis IMPRESSION: Diffuse uterine involvement by innumerable intramural fibroids, largest measuring 9.9 cm in maximum diameter. No intracavitary or pedunculated fibroids identified.  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 Malignancy cannot be excluded. Recommend correlation with tumor markers. Consider surgical evaluation and/or abdomen MRI without and with contrast for complete visualization of these lesions.    Labs: No pertinent recent labs obtained.  No Ca-125 accessible in chart.   Assessment and Plan: Sarah Warren is a 50 y.o. G2P1 female with history of abnormal uterine bleeding in the setting of uterine fibroids. Pre-[place] pelvic MRI is concerning for partially visualized bilateral ovarian cystic lesions.  I reviewed these findings with Ms. Bethune.  I will relay this information to Dr. Garwin Brothers.  In the mean time, I recommend obtaining an abdomen MRI with contrast for further evaluation of the bilateral adnexal masses and obtaining CA-125 labs.  We will also refer back to Dr. Garwin Brothers so she can review these results.  If tumor marker is negative and MRI  unremarkable/reassuring, will proceed with [place].   Electronically Signed: Suzette Battiest 10/16/2021, 12:04 PM   I spent a total of 25 Minutes in face to face in clinical consultation, greater than 50% of which was counseling/coordinating care for uterine fibroids.

## 2021-10-25 ENCOUNTER — Other Ambulatory Visit: Payer: Self-pay | Admitting: *Deleted

## 2021-10-25 DIAGNOSIS — N889 Noninflammatory disorder of cervix uteri, unspecified: Secondary | ICD-10-CM

## 2021-10-25 DIAGNOSIS — R1900 Intra-abdominal and pelvic swelling, mass and lump, unspecified site: Secondary | ICD-10-CM

## 2021-11-05 LAB — CA 125: CA 125: 6 U/mL (ref <35)

## 2021-11-07 ENCOUNTER — Telehealth: Payer: Self-pay | Admitting: Interventional Radiology

## 2021-11-07 NOTE — Telephone Encounter (Signed)
Spoke with Ms. Hacker and Dr. Garwin Brothers about her recent MRI findings.  Dr. Garwin Brothers plans to get her set up with Gyn-Onc for next steps.    This finding/diagnosis does not preclude her from [place] in the future if still desired.  Ms. Alf will contact our office if she wants to proceed in the future.    Ruthann Cancer, MD Pager: [phone]

## 2021-12-06 NOTE — Progress Notes (Signed)
3 MONTH FOLLOW UP  Assessment and Plan:   Diagnoses and all orders for this visit:  Benign labile hypertension Continue meds Monitor blood pressure at home; call if consistently over 130/80 Discussed DASH diet Advised to go to the ER if any CP, SOB, nausea, dizziness, severe HA, changes vision/speech, left arm numbness and tingling and jaw pain  Hyperlipidemia associated with type 2 diabetes mellitus (HCC) Mild elevations, patient preference to work aggressively on lifestyle to reverse diabetes, however after discussion of risks and benefit receptive to starting statin, discussed rosuvastatin 5 mg three days a week with slow titration plan LDL goal <70 Reviewed low saturated fat, high fiber diet Regular exercise encouraged Check lipid panel.  -     Lipid panel -     TSH  Type 2 diabetes mellitus with hyperlipidemia ([place]) Education: Reviewed ABCs of diabetes management (respective goals in parentheses):  A1C (<7), blood pressure (<130/80), and cholesterol (LDL <70) Eye Exam yearly and Dental Exam every 6 months. - eye exam report requested from Dr. Rolanda Jay today  Dietary recommendations Physical Activity recommendations -     COMPLETE METABOLIC PANEL WITH GFR -     Hemoglobin A1c  B12 deficiency Continue supplement   Medication management -     CBC with Differential/Platelet -     COMPLETE METABOLIC PANEL WITH GFR -     Magnesium  Morbid obesity (HCC) - BMI 35+ with htn, hld, T2DM Long discussion about weight loss, diet, and exercise Recommended diet heavy in fruits and veggies and low in animal meats, cheeses, and dairy products, appropriate calorie intake Patient will work on portions, reduce processed carb Discussed appropriate weight for height and initial goal (225 lb) Follow up at next visit in 3 months   Vitamin D deficiency Continue supplement as indicated by labs;  Recheck vitamin D   Discussed med's effects and SE's. Labs and tests as requested with  regular follow-up as recommended. Over 30 minutes of exam, counseling, chart review and critical decision making was performed  Future Appointments  Date Time Provider [place]  06/05/2022 10:00 AM Liane Comber, NP [facility] None    HPI  This very nice 51 y.o. AA female presents for 3 month follow up.  She has Morbid obesity ([place])- BMI 35+ with htn, T2DM; Vitamin D deficiency; B12 deficiency; Uterine leiomyoma; Medication management; Benign labile hypertension; Type 2 diabetes mellitus with hyperlipidemia ([place]); Hyperlipidemia associated with type 2 diabetes mellitus ([place]); Abnormal mammogram; and Iron deficiency anemia on their problem list.  She follows with GYN Dr. Garwin Brothers, very large uterine leioma "18-20 week uterus." Had recent MRI that showed possible R ovarian mass, had negative CA 125, but has hysterectomy scheduled 12/31/2021 by Dr. Genia Del.   Abnormal mammogram 07/2020, right upper outer breast cluster calcification, reports had again in March 06 2021 (see in care everywhere) and was stable. GYN follows.   BMI is Body mass index is 35.51 kg/m., she has been working on diet and exercise but admits needs to do better. Has cut out most rice/pasta.  Hasn't been in gym but planning, did dog sit in December and did a lot of dog walking.   Very rare soda/sweet tea, mostly water.  Wt Readings from Last 3 Encounters:  12/10/21 237 lb (107.5 kg)  09/10/21 232 lb (105.2 kg)  07/08/21 237 lb (107.5 kg)   She has not been checking BP at home, today their BP is BP: 118/70  She does not workout. She denies chest  pain, shortness of breath, dizziness.   She is not on cholesterol medication, strong patient preference to avoid, newly taking red yeast rice and tolerating, actually is receptive to statin today after discussion and review of cholesterol elevation hx and risk factors. Her cholesterol is not at goal. The cholesterol last visit was:   Lab Results  Component Value  Date   CHOL 225 (H) 09/10/2021   HDL 55 09/10/2021   LDLCALC 149 (H) 09/10/2021   TRIG 99 09/10/2021   CHOLHDL 4.1 09/10/2021    She has been working on diet and exercise for T2 diabetes (on jardiance, patient preference to minimize pills, stopped metformin), and denies foot ulcerations, hyperglycemia, hypoglycemia , increased appetite, nausea, paresthesia of the feet, polydipsia, polyuria, and visual disturbances.  She does check fasting glucose, ranges 89-163, fluctuates, higher after late night snacking Last A1C in the office was:  Lab Results  Component Value Date   HGBA1C 6.6 (H) 09/10/2021    Last GFR:  Lab Results  Component Value Date   GFRAA 105 01/31/2021   Patient is on Vitamin D supplement, reduced from from 50000 IU twice a week down to once weekly   Lab Results  Component Value Date   VD25OH 122 (H) 06/05/2021     She takes B12 supplement regularly Lab Results  Component Value Date   VITAMINB12 471 06/05/2021   In 06/05/2021 we found low iron/ferritin with no overt anemia, did have hemoccult x 3 which was negative. She does continue to have menstrual cycles, recently with heavy flow, does seem to be improving. Follows with GYN.  Lab Results  Component Value Date   IRON 47 09/10/2021   TIBC 374 09/10/2021   FERRITIN 24 09/10/2021   CBC Latest Ref Rng & Units 09/10/2021 06/05/2021 01/31/2021  WBC 3.8 - 10.8 Thousand/uL 7.7 6.9 7.5  Hemoglobin 11.7 - 15.5 g/dL 16.4(H) 14.0 13.9  Hematocrit 35.0 - 45.0 % 50.2(H) 44.9 42.9  Platelets 140 - 400 Thousand/uL 272 288 251      Current Medications:  Current Outpatient Medications on File Prior to Visit  Medication Sig Dispense Refill   Cyanocobalamin (VITAMIN B12 PO) Take by mouth daily. Takes 3-4 tablets twice a week     ferrous sulfate 325 (65 FE) MG tablet Take 325 mg by mouth daily with breakfast.     JARDIANCE 25 MG TABS tablet TAKE 1 TABLET DAILY 90 tablet 3   Latanoprost 0.005 % EMUL Place 0.005 drops into  both eyes at bedtime.     losartan-hydrochlorothiazide (HYZAAR) 100-25 MG tablet TAKE 1 TABLET DAILY 90 tablet 3   Multiple Vitamin (MULTIVITAMIN) tablet Take 1 tablet by mouth daily.     Red Yeast Rice Extract (RED YEAST RICE PO) Take by mouth daily.     timolol (TIMOPTIC) 0.5 % ophthalmic solution Place 0.5 drops into both eyes 2 (two) times daily.     Vitamin D, Ergocalciferol, (DRISDOL) 1.25 MG (50000 UNIT) CAPS capsule TAKE 1 CAPSULE 1 TIME WEEKLY 13 capsule 3   No current facility-administered medications on file prior to visit.    Medical History:  Past Medical History:  Diagnosis Date   Asthma    childhood   Glaucoma    Hypertension    Uterine fibroid    Allergies No Known Allergies  SURGICAL HISTORY She  has a past surgical history that includes Wisdom tooth extraction; IR Radiologist Eval & Mgmt (09/11/2021); and IR Radiologist Eval & Mgmt (10/16/2021). FAMILY HISTORY Her family history includes  Diabetes in her father, maternal grandmother, and mother; Hypertension in her father and mother. SOCIAL HISTORY She  reports that she has never smoked. She has never used smokeless tobacco. She reports current alcohol use. She reports that she does not use drugs.   Review of Systems: Review of Systems  Constitutional: Negative.  Negative for malaise/fatigue and weight loss.  HENT: Negative.  Negative for hearing loss and tinnitus.   Eyes: Negative.  Negative for blurred vision and double vision.  Respiratory: Negative.  Negative for cough, shortness of breath and wheezing.   Cardiovascular: Negative.  Negative for chest pain, palpitations, orthopnea, claudication and leg swelling.  Gastrointestinal: Negative.  Negative for abdominal pain, blood in stool, constipation, diarrhea, heartburn, melena, nausea and vomiting.  Genitourinary: Negative.   Musculoskeletal: Negative.  Negative for joint pain and myalgias.  Skin:  Negative for itching and rash.  Neurological: Negative.   Negative for dizziness, tingling, sensory change, weakness and headaches.  Endo/Heme/Allergies: Negative.  Negative for polydipsia.  Psychiatric/Behavioral: Negative.    All other systems reviewed and are negative.  Physical Exam: Estimated body mass index is 35.51 kg/m as calculated from the following:   Height as of this encounter: 5' 8.5" (1.74 m).   Weight as of this encounter: 237 lb (107.5 kg). BP 118/70    Pulse (!) 57    Temp 97.9 F (36.6 C)    Resp 16    Ht 5' 8.5" (1.74 m)    Wt 237 lb (107.5 kg)    SpO2 97%    BMI 35.51 kg/m  General Appearance: Well nourished, in no apparent distress.  Eyes: PERRLA, EOMs, conjunctiva no swelling or erythema Sinuses: No Frontal/maxillary tenderness  ENT/Mouth: Ext aud canals clear, normal light reflex with TMs without erythema, bulging. Good dentition. No erythema, swelling, or exudate on post pharynx. Tonsils not swollen or erythematous. Hearing normal.  Neck: Supple, thyroid normal. No bruits  Respiratory: Respiratory effort normal, BS equal bilaterally without rales, rhonchi, wheezing or stridor.  Cardio: RRR without murmurs, rubs or gallops. Brisk peripheral pulses without edema.  Chest: symmetric, with normal excursions and percussion.  Abdomen: Soft, nontender, + fixed enlarged uterus, nontender, rebound, hernias, masses, or organomegaly.  Lymphatics: Non tender without lymphadenopathy.  Musculoskeletal: Full ROM all peripheral extremities,5/5 strength, and normal gait.  Skin: Warm, dry without rashes, lesions, ecchymosis. Neuro: Cranial nerves intact, reflexes equal bilaterally. Normal muscle tone, no cerebellar symptoms. Sensation intact.  Psych: Awake and oriented X 3, normal affect, Insight and Judgment appropriate.   Izora Ribas, NP 4:07 PM [facility]

## 2021-12-10 ENCOUNTER — Encounter: Payer: Self-pay | Admitting: Adult Health

## 2021-12-10 ENCOUNTER — Other Ambulatory Visit: Payer: Self-pay

## 2021-12-10 ENCOUNTER — Ambulatory Visit (INDEPENDENT_AMBULATORY_CARE_PROVIDER_SITE_OTHER): Payer: 59 | Admitting: Adult Health

## 2021-12-10 VITALS — BP 118/70 | HR 57 | Temp 97.9°F | Resp 16 | Ht 68.5 in | Wt 237.0 lb

## 2021-12-10 DIAGNOSIS — E1169 Type 2 diabetes mellitus with other specified complication: Secondary | ICD-10-CM

## 2021-12-10 DIAGNOSIS — D509 Iron deficiency anemia, unspecified: Secondary | ICD-10-CM

## 2021-12-10 DIAGNOSIS — I10 Essential (primary) hypertension: Secondary | ICD-10-CM

## 2021-12-10 DIAGNOSIS — E538 Deficiency of other specified B group vitamins: Secondary | ICD-10-CM

## 2021-12-10 DIAGNOSIS — E559 Vitamin D deficiency, unspecified: Secondary | ICD-10-CM

## 2021-12-10 DIAGNOSIS — Z79899 Other long term (current) drug therapy: Secondary | ICD-10-CM

## 2021-12-10 DIAGNOSIS — E785 Hyperlipidemia, unspecified: Secondary | ICD-10-CM

## 2021-12-10 LAB — TSH: TSH: 1.81 mIU/L

## 2021-12-10 LAB — LIPID PANEL
Cholesterol: 226 mg/dL — ABNORMAL HIGH (ref <200)
HDL: 55 mg/dL (ref >=50)
LDL Cholesterol (Calc): 152 mg/dL (calc) — ABNORMAL HIGH
Non-HDL Cholesterol (Calc): 171 mg/dL (calc) — ABNORMAL HIGH (ref <130)
Total CHOL/HDL Ratio: 4.1 (calc) (ref <5.0)
Triglycerides: 86 mg/dL (ref <150)

## 2021-12-10 LAB — COMPLETE METABOLIC PANEL WITH GFR
AG Ratio: 1.1 (calc) (ref 1.0–2.5)
ALT: 13 U/L (ref 6–29)
AST: 14 U/L (ref 10–35)
Albumin: 4 g/dL (ref 3.6–5.1)
Alkaline phosphatase (APISO): 56 U/L (ref 37–153)
BUN: 15 mg/dL (ref 7–25)
CO2: 33 mmol/L — ABNORMAL HIGH (ref 20–32)
Calcium: 9.7 mg/dL (ref 8.6–10.4)
Chloride: 100 mmol/L (ref 98–110)
Creat: 0.78 mg/dL (ref 0.50–1.03)
Globulin: 3.8 g/dL (calc) — ABNORMAL HIGH (ref 1.9–3.7)
Glucose, Bld: 89 mg/dL (ref 65–99)
Potassium: 3.7 mmol/L (ref 3.5–5.3)
Sodium: 138 mmol/L (ref 135–146)
Total Bilirubin: 0.5 mg/dL (ref 0.2–1.2)
Total Protein: 7.8 g/dL (ref 6.1–8.1)
eGFR: 91 mL/min/{1.73_m2} (ref >=60)

## 2021-12-10 LAB — CBC WITH DIFFERENTIAL/PLATELET
Absolute Monocytes: 471 cells/uL (ref 200–950)
Basophils Absolute: 38 cells/uL (ref 0–200)
Basophils Relative: 0.5 %
Eosinophils Absolute: 137 cells/uL (ref 15–500)
Eosinophils Relative: 1.8 %
HCT: 48.8 % — ABNORMAL HIGH (ref 35.0–45.0)
Hemoglobin: 16.1 g/dL — ABNORMAL HIGH (ref 11.7–15.5)
Lymphs Abs: 3359 cells/uL (ref 850–3900)
MCH: 27.5 pg (ref 27.0–33.0)
MCHC: 33 g/dL (ref 32.0–36.0)
MCV: 83.4 fL (ref 80.0–100.0)
MPV: 12.2 fL (ref 7.5–12.5)
Monocytes Relative: 6.2 %
Neutro Abs: 3595 cells/uL (ref 1500–7800)
Neutrophils Relative %: 47.3 %
Platelets: 256 10*3/uL (ref 140–400)
RBC: 5.85 10*6/uL — ABNORMAL HIGH (ref 3.80–5.10)
RDW: 13.3 % (ref 11.0–15.0)
Total Lymphocyte: 44.2 %
WBC: 7.6 10*3/uL (ref 3.8–10.8)

## 2021-12-10 LAB — HEMOGLOBIN A1C
Hgb A1c MFr Bld: 6.8 % of total Hgb — ABNORMAL HIGH (ref <5.7)
Mean Plasma Glucose: 148 mg/dL
eAG (mmol/L): 8.2 mmol/L

## 2021-12-10 LAB — MAGNESIUM: Magnesium: 2 mg/dL (ref 1.5–2.5)

## 2021-12-10 MED ORDER — ROSUVASTATIN CALCIUM 5 MG PO TABS: ORAL_TABLET | ORAL | 3 refills | Status: AC

## 2021-12-10 NOTE — Addendum Note (Signed)
Addended by: Izora Ribas on: 12/10/2021 04:33 PM   Modules accepted: Orders

## 2021-12-10 NOTE — Patient Instructions (Addendum)
Goals      Blood Pressure < 130/80     Fasting glucose <100     LDL CALC < 70     Weight (lb) < 225 lb (102.1 kg)          Rosuvastatin Tablets What is this medication? ROSUVASTATIN (roe SOO va sta tin) treats high cholesterol and reduces the risk of heart attack and stroke. It works by decreasing bad cholesterol and fats (such as LDL, triglycerides), and increasing good cholesterol (HDL) in your blood. It belongs to a group of medications called statins. Changes to diet and exercise are often combined with this medication. This medicine may be used for other purposes; ask your health care provider or pharmacist if you have questions. COMMON BRAND NAME(S): Crestor What should I tell my care team before I take this medication? They need to know if you have any of these conditions: Diabetes (high blood sugar) If you often drink alcohol Kidney disease Liver disease Muscle cramps, pain Stroke Thyroid disease An unusual or allergic reaction to rosuvastatin, other medications, foods, dyes, or preservatives Pregnant or trying to get pregnant Breast-feeding How should I use this medication? Take this medication by mouth with a glass of water. Follow the directions on the prescription label. You can take it with or without food. If it upsets your stomach, take it with food. Do not cut, crush or chew this medication. Swallow the tablets whole. Take your medication at regular intervals. Do not take it more often than directed. Take antacids that have a combination of aluminum and magnesium hydroxide in them at a different time of day than this medication. Take these products 2 hours AFTER this medication. Talk to your care team about the use of this medication in children. While this medication may be prescribed for children as young as 7 for selected conditions, precautions do apply. Overdosage: If you think you have taken too much of this medicine contact a poison control center or emergency  room at once. NOTE: This medicine is only for you. Do not share this medicine with others. What if I miss a dose? If you miss a dose, take it as soon as you can. If your next dose is to be taken in less than 12 hours, then do not take the missed dose. Take the next dose at your regular time. Do not take double or extra doses. What may interact with this medication? Do not take this medication with any of the following: Supplements like red yeast rice This medication may also interact with the following: Alcohol Antacids containing aluminum hydroxide and magnesium hydroxide Cyclosporine Other medications for high cholesterol Some medications for HIV infection Warfarin This list may not describe all possible interactions. Give your health care provider a list of all the medicines, herbs, non-prescription drugs, or dietary supplements you use. Also tell them if you smoke, drink alcohol, or use illegal drugs. Some items may interact with your medicine. What should I watch for while using this medication? Visit your health care provider for regular checks on your progress. Tell your health care provider if your symptoms do not start to get better or if they get worse. Your health care provider may tell you to stop taking this medication if you develop muscle problems. If your muscle problems do not go away after stopping this medication, contact your health care provider. Do not become pregnant while taking this medication. Women should inform their health care provider if they wish to become pregnant  or think they might be pregnant. There is potential for serious harm to an unborn child. Talk to your health care provider for more information. Do not breast-feed an infant while taking this medication. This medication may increase blood sugar. Ask your health care provider if changes in diet or medications are needed if you have diabetes. If you are going to need surgery or other procedure, tell your  health care provider that you are using this medication. Taking this medication is only part of a total heart healthy program. Your health care provider may give you a special diet to follow. Avoid alcohol. Avoid smoking. Ask your health care provider how much you should exercise. What side effects may I notice from receiving this medication? Side effects that you should report to your care team as soon as possible: Allergic reactions--skin rash, itching, hives, swelling of the face, lips, tongue, or throat High blood sugar (hyperglycemia)--increased thirst or amount of urine, unusual weakness, fatigue, blurry vision Liver injury--right upper belly pain, loss of appetite, nausea, light-colored stool, dark yellow or brown urine, yellowing skin or eyes, unusual weakness, fatigue Muscle injury--unusual weakness, fatigue, muscle pain, dark yellow or brown urine, decrease in amount of urine Redness, blistering, peeling or loosening of the skin, including inside the mouth Side effects that usually do not require medical attention (report to your care team if they continue or are bothersome): Fatigue Headache Nausea Stomach pain This list may not describe all possible side effects. Call your doctor for medical advice about side effects. You may report side effects to FDA at 1-800-FDA-1088. Where should I keep my medication? Keep out of the reach of children and pets. Store between 20 and 25 degrees C (68 and 77 degrees F). Get rid of any unused medication after the expiration date. To get rid of medications that are no longer needed or have expired: Take the medication to a medication take-back program. Check with your pharmacy or law enforcement to find a location. If you cannot return the medication, check the label or package insert to see if the medication should be thrown out in the garbage or flushed down the toilet. If you are not sure, ask your care team. If it is safe to put it in the trash,  take the medication out of the container. Mix the medication with cat litter, dirt, coffee grounds, or other unwanted substance. Seal the mixture in a bag or container. Put it in the trash. NOTE: This sheet is a summary. It may not cover all possible information. If you have questions about this medicine, talk to your doctor, pharmacist, or health care provider.  2022 [employer]vier/Gold Standard (2020-12-07 00:00:00)

## 2021-12-31 HISTORY — PX: TOTAL ABDOMINAL HYSTERECTOMY W/ BILATERAL SALPINGOOPHORECTOMY: SHX83

## 2022-01-03 ENCOUNTER — Encounter: Payer: Self-pay | Admitting: Adult Health

## 2022-01-03 DIAGNOSIS — Z9079 Acquired absence of other genital organ(s): Secondary | ICD-10-CM | POA: Insufficient documentation

## 2022-01-03 DIAGNOSIS — Z90722 Acquired absence of ovaries, bilateral: Secondary | ICD-10-CM | POA: Insufficient documentation

## 2022-01-06 ENCOUNTER — Encounter: Payer: Self-pay | Admitting: Internal Medicine

## 2022-01-08 ENCOUNTER — Encounter: Payer: Self-pay | Admitting: Internal Medicine

## 2022-02-12 ENCOUNTER — Ambulatory Visit (INDEPENDENT_AMBULATORY_CARE_PROVIDER_SITE_OTHER): Payer: 59 | Admitting: Adult Health

## 2022-02-12 ENCOUNTER — Encounter: Payer: Self-pay | Admitting: Adult Health

## 2022-02-12 ENCOUNTER — Other Ambulatory Visit: Payer: Self-pay

## 2022-02-12 ENCOUNTER — Ambulatory Visit
Admission: RE | Admit: 2022-02-12 | Discharge: 2022-02-12 | Disposition: A | Discharge status: Home / Self Care | Payer: Self-pay | Source: Ambulatory Visit | Attending: Adult Health | Admitting: Adult Health

## 2022-02-12 VITALS — BP 118/80 | HR 61 | Temp 97.2°F | Wt 231.0 lb

## 2022-02-12 DIAGNOSIS — M545 Low back pain, unspecified: Secondary | ICD-10-CM

## 2022-02-12 DIAGNOSIS — M5431 Sciatica, right side: Secondary | ICD-10-CM | POA: Diagnosis not present

## 2022-02-12 MED ORDER — MELOXICAM 15 MG PO TABS: ORAL_TABLET | ORAL | 1 refills | Status: AC

## 2022-02-12 NOTE — Progress Notes (Signed)
Assessment and Plan: ? ?Zaela was seen today for leg pain. ? ?Diagnoses and all orders for this visit: ? ?Right sided sciatica ?Right SI pain, negative straight leg, no bowel/bladder problems ?Get lumbar XR ?Mobic, RICE, and exercise given ?If not better with refer to PT/orthopedics.  ?-     meloxicam (MOBIC) 15 MG tablet; Take one daily with food for 2 weeks, can take with tylenol, can not take with aleve, iburpofen, then as needed daily for pain ?-     DG Lumbar Spine Complete; Future ? ?Further disposition pending results of labs. Discussed med's effects and SE's.   ?Over 30 minutes of exam, counseling, chart review, and critical decision making was performed.  ? ?Future Appointments  ?Date Time Provider [place]  ?03/11/2022  3:30 PM Liane Comber, NP [facility] None  ?06/05/2022 10:00 AM Liane Comber, NP [facility] None  ? ? ?------------------------------------------------------------------------------------------------------------------ ? ? ?HPI ?BP 118/80   Pulse 61   Temp (!) 97.2 ?F (36.2 ?C)   Wt 231 lb (104.8 kg)   SpO2 99%   BMI 34.61 kg/m?  ?51 y.o.female presents for evaluation of R sided leg pain x 1 month.  ? ?Underwent TAH with bil salpingo oopherectomy on 12/31/2021. No sx immediately after. About 1 week later started having cramping/burning sensation in R calf, shoots up posterior hamstrings. She reports contacted surgeon, did have [place] to r/o DVT.  ? ?Pain is worse after extended rest, sitting or sleeping, pain in calf, radiating up posterior thigh to lateral hip and glute area, only occasionally some stiffness in lumbar back. When very stiff has had some occasional tingling in R foot.  ? ?She has tried ibuprofen 400 mg, only at night and topical vicks vapo rub started 3 days ago with some improvement. Was trying some stretches but had more pain and stopped. ? ? ?Past Medical History:  ?Diagnosis Date  ? Asthma   ? childhood  ? Glaucoma   ? Hypertension   ? Uterine fibroid   ?  Uterine leiomyoma 03/23/2018  ? Following with GYN S/p myomectomy S/p TAH bil salpingooopherectomy 12/31/2021  ?  ? ?No Known Allergies ? ?Current Outpatient Medications on File Prior to Visit  ?Medication Sig  ? Cyanocobalamin (VITAMIN B12 PO) Take by mouth daily. Takes 3-4 tablets twice a week  ? JARDIANCE 25 MG TABS tablet TAKE 1 TABLET DAILY  ? Latanoprost 0.005 % EMUL Place 0.005 drops into both eyes at bedtime.  ? losartan-hydrochlorothiazide (HYZAAR) 100-25 MG tablet TAKE 1 TABLET DAILY  ? Multiple Vitamin (MULTIVITAMIN) tablet Take 1 tablet by mouth daily.  ? rosuvastatin (CRESTOR) 5 MG tablet Take 1 tab three nights a week for cholesterol.  ? timolol (TIMOPTIC) 0.5 % ophthalmic solution Place 0.5 drops into both eyes 2 (two) times daily.  ? Vitamin D, Ergocalciferol, (DRISDOL) 1.25 MG (50000 UNIT) CAPS capsule TAKE 1 CAPSULE 1 TIME WEEKLY  ? ferrous sulfate 325 (65 FE) MG tablet Take 325 mg by mouth daily with breakfast. (Patient not taking: Reported on 02/12/2022)  ? ?No current facility-administered medications on file prior to visit.  ? ? ?ROS: all negative except above.  ? ?Physical Exam: ? ?BP 118/80   Pulse 61   Temp (!) 97.2 ?F (36.2 ?C)   Wt 231 lb (104.8 kg)   SpO2 99%   BMI 34.61 kg/m?  ? ?General Appearance: Well nourished, in no apparent distress. ?Eyes: PERRLA, conjunctiva no swelling or erythema ?ENT/Mouth: mask in place; Hearing normal.  ?Neck: Supple, thyroid normal.  ?  Respiratory: Respiratory effort normal, BS equal bilaterally without rales, rhonchi, wheezing or stridor.  ?Cardio: RRR with no MRGs. Brisk peripheral pulses without edema.  ?Abdomen: Soft, + BS.  Non tender, no guarding, rebound, hernias, masses. ?Lymphatics: Non tender without lymphadenopathy.  ?Musculoskeletal: Patient is able to ambulate well. Gait is not  Antalgic. Straight leg raising with dorsiflexion negative bilaterally for radicular symptoms. Sensory exam in the legs are normal. Knee reflexes are normal Ankle  reflexes are normal Strength is normal and symmetric in arms and legs. There is SI tenderness to palpation.  There is not paraspinal muscle spasm.  There is not midline tenderness.  ROM of spine with  limited in no spheres due to pain.  ?Skin: Warm, dry without rashes, lesions, ecchymosis.  ?Neuro: Normal muscle tone ?Psych: Awake and oriented X 3, normal affect, Insight and Judgment appropriate.  ? ? ?Izora Ribas, NP ?1:24 PM ?[facility] ? ?

## 2022-02-12 NOTE — Patient Instructions (Addendum)
To get your xray:  ?Please get your chest xray at [address] [facility], can walk in without an appointment M-F, 8-4pm ? ? ? ?Sciatica Rehab ?Ask your health care provider which exercises are safe for you. Do exercises exactly as told by your health care provider and adjust them as directed. It is normal to feel mild stretching, pulling, tightness, or discomfort as you do these exercises. Stop right away if you feel sudden pain or your pain gets worse. Do not begin these exercises until told by your health care provider. ?Stretching and range-of-motion exercises ?These exercises warm up your muscles and joints and improve the movement and flexibility of your hips and back. These exercises also help to relieve pain, numbness, and tingling. ?Sciatic nerve glide ?Sit in a chair with your head facing down toward your chest. Place your hands behind your back. Let your shoulders slump forward. ?Slowly straighten one of your legs while you tilt your head back as if you are looking toward the ceiling. Only straighten your leg as far as you can without making your symptoms worse. ?Hold this position for __________ seconds. ?Slowly return to the starting position. ?Repeat with your other leg. ?Repeat __________ times. Complete this exercise __________ times a day. ?Knee to chest with hip adduction and internal rotation ? ?Lie on your back on a firm surface with both legs straight. ?Bend one of your knees and move it up toward your chest until you feel a gentle stretch in your lower back and buttock. Then, move your knee toward the shoulder that is on the opposite side from your leg. This is hip adduction and internal rotation. ?Hold your leg in this position by holding on to the front of your knee. ?Hold this position for __________ seconds. ?Slowly return to the starting position. ?Repeat with your other leg. ?Repeat __________ times. Complete this exercise __________ times a day. ?Prone extension on  elbows ? ?Lie on your abdomen on a firm surface. A bed may be too soft for this exercise. ?Prop yourself up on your elbows. ?Use your arms to help lift your chest up until you feel a gentle stretch in your abdomen and your lower back. ?This will place some of your body weight on your elbows. If this is uncomfortable, try stacking pillows under your chest. ?Your hips should stay down, against the surface that you are lying on. Keep your hip and back muscles relaxed. ?Hold this position for __________ seconds. ?Slowly relax your upper body and return to the starting position. ?Repeat __________ times. Complete this exercise __________ times a day. ?Strengthening exercises ?These exercises build strength and endurance in your back. Endurance is the ability to use your muscles for a long time, even after they get tired. ?Pelvic tilt ?This exercise strengthens the muscles that lie deep in the abdomen. ?Lie on your back on a firm surface. Bend your knees and keep your feet flat on the floor. ?Tense your abdominal muscles. Tip your pelvis up toward the ceiling and flatten your lower back into the floor. ?To help with this exercise, you may place a small towel under your lower back and try to push your back into the towel. ?Hold this position for __________ seconds. ?Let your muscles relax completely before you repeat this exercise. ?Repeat __________ times. Complete this exercise __________ times a day. ?Alternating arm and leg raises ? ?Get on your hands and knees on a firm surface. If you are on a hard floor, you  may want to use padding, such as an exercise mat, to cushion your knees. ?Line up your arms and legs. Your hands should be directly below your shoulders, and your knees should be directly below your hips. ?Lift your left leg behind you. At the same time, raise your right arm and straighten it in front of you. ?Do not lift your leg higher than your hip. ?Do not lift your arm higher than your shoulder. ?Keep your  abdominal and back muscles tight. ?Keep your hips facing the ground. ?Do not arch your back. ?Keep your balance carefully, and do not hold your breath. ?Hold this position for __________ seconds. ?Slowly return to the starting position. ?Repeat with your right leg and your left arm. ?Repeat __________ times. Complete this exercise __________ times a day. ?Posture and body mechanics ?Good posture and healthy body mechanics can help to relieve stress in your body's tissues and joints. Body mechanics refers to the movements and positions of your body while you do your daily activities. Posture is part of body mechanics. Good posture means: ?Your spine is in its natural S-curve position (neutral). ?Your shoulders are pulled back slightly. ?Your head is not tipped forward. ?Follow these guidelines to improve your posture and body mechanics in your everyday activities. ?Standing ? ?When standing, keep your spine neutral and your feet about hip width apart. Keep a slight bend in your knees. Your ears, shoulders, and hips should line up. ?When you do a task in which you stand in one place for a long time, place one foot up on a stable object that is 2-4 inches (5-10 cm) high, such as a footstool. This helps keep your spine neutral. ?Sitting ? ?When sitting, keep your spine neutral and keep your feet flat on the floor. Use a footrest, if necessary, and keep your thighs parallel to the floor. Avoid rounding your shoulders, and avoid tilting your head forward. ?When working at a desk or a computer, keep your desk at a height where your hands are slightly lower than your elbows. Slide your chair under your desk so you are close enough to maintain good posture. ?When working at a computer, place your monitor at a height where you are looking straight ahead and you do not have to tilt your head forward or downward to look at the screen. ?Resting ?When lying down and resting, avoid positions that are most painful for you. ?If you  have pain with activities such as sitting, bending, stooping, or squatting, lie in a position in which your body does not bend very much. For example, avoid curling up on your side with your arms and knees near your chest (fetal position). ?If you have pain with activities such as standing for a long time or reaching with your arms, lie with your spine in a neutral position and bend your knees slightly. Try the following positions: ?Lying on your side with a pillow between your knees. ?Lying on your back with a pillow under your knees. ?Lifting ? ?When lifting objects, keep your feet at least shoulder width apart and tighten your abdominal muscles. ?Bend your knees and hips and keep your spine neutral. It is important to lift using the strength of your legs, not your back. Do not lock your knees straight out. ?Always ask for help to lift heavy or awkward objects. ?This information is not intended to replace advice given to you by your health care provider. Make sure you discuss any questions you have with your health  care provider. ?Document Revised: 03/04/2019 Document Reviewed: 12/02/2018 ?Elsevier Patient Education ? [place]. ? ?

## 2022-03-07 LAB — HM MAMMOGRAPHY

## 2022-03-07 NOTE — Progress Notes (Deleted)
Click Here to Calculate/Change CHADS2VASc Score The patient's CHADS2-VASc score is  , indicating a  % annual risk of stroke.  Therefore, anticoagulation {is/is not:320031} recommended.     This patients CHA2DS2-VASc Score and unadjusted Ischemic Stroke Rate (% per year) is equal to {CVA risk based on CHA2DS2 Vasc Score:430000501}  Above score calculated as 1 point each if present [CHF, HTN, DM, Vascular=MI/PAD/Aortic Plaque, Age if 65-74, or Female] Above score calculated as 2 points each if present [Age > 75, or Stroke/TIA/TE]

## 2022-03-11 ENCOUNTER — Ambulatory Visit: Payer: 59 | Admitting: Adult Health

## 2022-03-19 ENCOUNTER — Encounter: Payer: Self-pay | Admitting: Internal Medicine

## 2022-03-23 ENCOUNTER — Other Ambulatory Visit: Payer: Self-pay | Admitting: Adult Health

## 2022-03-23 DIAGNOSIS — E559 Vitamin D deficiency, unspecified: Secondary | ICD-10-CM

## 2022-06-05 ENCOUNTER — Ambulatory Visit (INDEPENDENT_AMBULATORY_CARE_PROVIDER_SITE_OTHER): Payer: 59 | Admitting: Nurse Practitioner

## 2022-06-05 ENCOUNTER — Encounter: Payer: Managed Care, Other (non HMO) | Admitting: Adult Health

## 2022-06-05 ENCOUNTER — Encounter: Payer: Self-pay | Admitting: Nurse Practitioner

## 2022-06-05 VITALS — BP 114/74 | HR 59 | Temp 97.5°F | Ht 69.0 in | Wt 231.6 lb

## 2022-06-05 DIAGNOSIS — Z136 Encounter for screening for cardiovascular disorders: Secondary | ICD-10-CM

## 2022-06-05 DIAGNOSIS — E1169 Type 2 diabetes mellitus with other specified complication: Secondary | ICD-10-CM

## 2022-06-05 DIAGNOSIS — M5432 Sciatica, left side: Secondary | ICD-10-CM

## 2022-06-05 DIAGNOSIS — Z0000 Encounter for general adult medical examination without abnormal findings: Secondary | ICD-10-CM | POA: Diagnosis not present

## 2022-06-05 DIAGNOSIS — Z79899 Other long term (current) drug therapy: Secondary | ICD-10-CM

## 2022-06-05 DIAGNOSIS — D509 Iron deficiency anemia, unspecified: Secondary | ICD-10-CM

## 2022-06-05 DIAGNOSIS — E559 Vitamin D deficiency, unspecified: Secondary | ICD-10-CM

## 2022-06-05 DIAGNOSIS — Z90710 Acquired absence of both cervix and uterus: Secondary | ICD-10-CM

## 2022-06-05 DIAGNOSIS — Z0001 Encounter for general adult medical examination with abnormal findings: Secondary | ICD-10-CM

## 2022-06-05 DIAGNOSIS — Z1389 Encounter for screening for other disorder: Secondary | ICD-10-CM

## 2022-06-05 DIAGNOSIS — I10 Essential (primary) hypertension: Secondary | ICD-10-CM

## 2022-06-05 DIAGNOSIS — R928 Other abnormal and inconclusive findings on diagnostic imaging of breast: Secondary | ICD-10-CM

## 2022-06-05 NOTE — Patient Instructions (Addendum)
Semaglutide Tablets What is this medication? SEMAGLUTIDE (SEM a GLOO tide) treats type 2 diabetes. It works by increasing insulin levels in your body, which decreases your blood sugar (glucose). It also reduces the amount of sugar released into the blood and slows down your digestion. Changes to diet and exercise are often combined with this medication. This medicine may be used for other purposes; ask your health care provider or pharmacist if you have questions. COMMON BRAND NAME(S): Rybelsus What should I tell my care team before I take this medication? They need to know if you have any of these conditions: Endocrine tumors (MEN 2) or if someone in your family had these tumors Eye disease History of pancreatitis Kidney disease Stomach or intestine problems Thyroid cancer or if someone in your family had thyroid cancer Vision problems An unusual or allergic reaction to semaglutide, other medications, foods, dyes, or preservatives Pregnant or trying to get pregnant Breast-feeding How should I use this medication? Take this medication by mouth. Take it as directed on the prescription label at the same time every day. Take the dose right after waking up. Do not eat or drink anything before taking it. Do not take it with any other drink except a glass of plain water that is less than 4 ounces (less than 120 mL). Do not cut, crush or chew this medication. Swallow the tablets whole. After taking it, do not eat breakfast, drink, or take any other medications or vitamins for at least 30 minutes. Keep taking it unless your care team tells you to stop. A special MedGuide will be given to you by the pharmacist with each prescription and refill. Be sure to read this information carefully each time. Talk to your care team about the use of this medication in children. Special care may be needed. Overdosage: If you think you have taken too much of this medicine contact a poison control center or emergency  room at once. NOTE: This medicine is only for you. Do not share this medicine with others. What if I miss a dose? If you miss a dose, skip it. Take your next dose at the normal time. Do not take extra or 2 doses at the same time to make up for the missed dose. What may interact with this medication? What may interact with this medication? Aminophylline Carbamazepine Cyclosporine Digoxin Levothyroxine Other medications for diabetes Phenytoin Tacrolimus Theophylline Warfarin Many medications may cause changes in blood sugar, these include: Alcohol containing beverages Antiviral medications for HIV or AIDS Aspirin and aspirin-like medications Certain medications for blood pressure, heart disease, irregular heart beat Chromium Diuretics Female hormones, such as estrogens or progestins, birth control pills Fenofibrate Gemfibrozil Isoniazid Lanreotide Female hormones or anabolic steroids MAOIs like Carbex, Eldepryl, Marplan, Nardil, and Parnate Medications for weight loss Medications for allergies, asthma, cold, or cough Medications for depression, anxiety, or psychotic disturbances Niacin Nicotine NSAIDs, medications for pain and inflammation, like ibuprofen or naproxen Octreotide Pasireotide Pentamidine Phenytoin Probenecid Quinolone antibiotics such as ciprofloxacin, levofloxacin, ofloxacin Some herbal dietary supplements Steroid medications such as prednisone or cortisone Sulfamethoxazole; trimethoprim Thyroid hormones Some medications can hide the warning symptoms of low blood sugar (hypoglycemia). You may need to monitor your blood sugar more closely if you are taking one of these medications. These include: Beta-blockers, often used for high blood pressure or heart problems (examples include atenolol, metoprolol, propranolol) Clonidine Guanethidine Reserpine This list may not describe all possible interactions. Give your health care provider a list of all the  medicines, herbs, non-prescription drugs, or dietary supplements you use. Also tell them if you smoke, drink alcohol, or use illegal drugs. Some items may interact with your medicine. What should I watch for while using this medication? Visit your care team for regular checks on your progress. Check with your care team if you have severe diarrhea, nausea, and vomiting, or if you sweat a lot. The loss of too much body fluid may make it dangerous for you to take this medication. A test called the HbA1C (A1C) will be monitored. This is a simple blood test. It measures your blood sugar control over the last 2 to 3 months. You will receive this test every 3 to 6 months. Learn how to check your blood sugar. Learn the symptoms of low and high blood sugar and how to manage them. Always carry a quick-source of sugar with you in case you have symptoms of low blood sugar. Examples include hard sugar candy or glucose tablets. Make sure others know that you can choke if you eat or drink when you develop serious symptoms of low blood sugar, such as seizures or unconsciousness. Get medical help at once. Tell your care team if you have high blood sugar. You might need to change the dose of your medication. If you are sick or exercising more than usual, you might need to change the dose of your medication. Do not skip meals. Ask your care team if you should avoid alcohol. Many nonprescription cough and cold products contain sugar or alcohol. These can affect blood sugar. Wear a medical ID bracelet or chain. Carry a card that describes your condition. List the medications and doses you take on the card. Do not become pregnant while taking this medication. Women should inform their care team if they wish to become pregnant or think they might be pregnant. There is a potential for serious side effects to an unborn child. Talk to your care team for more information. Do not breast-feed an infant while taking this  medication. What side effects may I notice from receiving this medication? Side effects that you should report to your care team as soon as possible: Allergic reactions--skin rash, itching, hives, swelling of the face, lips, tongue, or throat Change in vision Dehydration--increased thirst, dry mouth, feeling faint or lightheaded, headache, dark yellow or brown urine Gallbladder problems--severe stomach pain, nausea, vomiting, fever Heart palpitations--rapid, pounding, or irregular heartbeat Kidney injury--decrease in the amount of urine, swelling of the ankles, hands, or feet Pancreatitis--severe stomach pain that spreads to your back or gets worse after eating or when touched, fever, nausea, vomiting Thyroid cancer--new mass or lump in the neck, pain or trouble swallowing, trouble breathing, hoarseness Side effects that usually do not require medical attention (report to your care team if they continue or are bothersome): Diarrhea Loss of appetite Nausea Stomach pain Vomiting This list may not describe all possible side effects. Call your doctor for medical advice about side effects. You may report side effects to FDA at 1-800-FDA-1088. Where should I keep my medication? Keep out of the reach of children and pets. Store at room temperature between 20 and 25 degrees C (68 and 77 degrees F). Keep this medication in the original container. Protect from moisture. Keep the container tightly closed. Get rid of any unused medication after the expiration date. To get rid of medications that are no longer needed or have expired: Take the medication to a medication take-back program. Check with your pharmacy or law enforcement to  find a location. If you cannot return the medication, check the label or package insert to see if the medication should be thrown out in the garbage or flushed down the toilet. If you are not sure, ask your care team. If it is safe to put it in the trash, take the medication  out of the container. Mix the medication with cat litter, dirt, coffee grounds, or other unwanted substance. Seal the mixture in a bag or container. Put it in the trash. NOTE: This sheet is a summary. It may not cover all possible information. If you have questions about this medicine, talk to your doctor, pharmacist, or health care provider.  2023 Elsevier/Gold Standard (2021-03-20 00:00:00) Healthy Eating Following a healthy eating pattern may help you to achieve and maintain a healthy body weight, reduce the risk of chronic disease, and live a long and productive life. It is important to follow a healthy eating pattern at an appropriate calorie level for your body. Your nutritional needs should be met primarily through food by choosing a variety of nutrient-rich foods. What are tips for following this plan? Reading food labels Read labels and choose the following: Reduced or low sodium. Juices with 100% fruit juice. Foods with low saturated fats and high polyunsaturated and monounsaturated fats. Foods with whole grains, such as whole wheat, cracked wheat, brown rice, and wild rice. Whole grains that are fortified with folic acid. This is recommended for women who are pregnant or who want to become pregnant. Read labels and avoid the following: Foods with a lot of added sugars. These include foods that contain brown sugar, corn sweetener, corn syrup, dextrose, fructose, glucose, high-fructose corn syrup, honey, invert sugar, lactose, malt syrup, maltose, molasses, raw sugar, sucrose, trehalose, or turbinado sugar. Do not eat more than the following amounts of added sugar per day: 6 teaspoons (25 g) for women. 9 teaspoons (38 g) for men. Foods that contain processed or refined starches and grains. Refined grain products, such as white flour, degermed cornmeal, white bread, and white rice. Shopping Choose nutrient-rich snacks, such as vegetables, whole fruits, and nuts. Avoid high-calorie and  high-sugar snacks, such as potato chips, fruit snacks, and candy. Use oil-based dressings and spreads on foods instead of solid fats such as butter, stick margarine, or cream cheese. Limit pre-made sauces, mixes, and "instant" products such as flavored rice, instant noodles, and ready-made pasta. Try more plant-protein sources, such as tofu, tempeh, black beans, edamame, lentils, nuts, and seeds. Explore eating plans such as the Mediterranean diet or vegetarian diet. Cooking Use oil to saut or stir-fry foods instead of solid fats such as butter, stick margarine, or lard. Try baking, boiling, grilling, or broiling instead of frying. Remove the fatty part of meats before cooking. Steam vegetables in water or broth. Meal planning  At meals, imagine dividing your plate into fourths: One-half of your plate is fruits and vegetables. One-fourth of your plate is whole grains. One-fourth of your plate is protein, especially lean meats, poultry, eggs, tofu, beans, or nuts. Include low-fat dairy as part of your daily diet. Lifestyle Choose healthy options in all settings, including home, work, school, restaurants, or stores. Prepare your food safely: Wash your hands after handling raw meats. Keep food preparation surfaces clean by regularly washing with hot, soapy water. Keep raw meats separate from ready-to-eat foods, such as fruits and vegetables. Cook seafood, meat, poultry, and eggs to the recommended internal temperature. Store foods at safe temperatures. In general: Keep cold foods at 16F (  4.4C) or below. Keep hot foods at 140F (60C) or above. Keep your freezer at [facility] (-17.8C) or below. Foods are no longer safe to eat when they have been between the temperatures of 40-140F (4.4-60C) for more than 2 hours. What foods should I eat? Fruits Aim to eat 2 cup-equivalents of fresh, canned (in natural juice), or frozen fruits each day. Examples of 1 cup-equivalent of fruit include 1 small  apple, 8 large strawberries, 1 cup canned fruit,  cup dried fruit, or 1 cup 100% juice. Vegetables Aim to eat 2-3 cup-equivalents of fresh and frozen vegetables each day, including different varieties and colors. Examples of 1 cup-equivalent of vegetables include 2 medium carrots, 2 cups raw, leafy greens, 1 cup chopped vegetable (raw or cooked), or 1 medium baked potato. Grains Aim to eat 6 ounce-equivalents of whole grains each day. Examples of 1 ounce-equivalent of grains include 1 slice of bread, 1 cup ready-to-eat cereal, 3 cups popcorn, or  cup cooked rice, pasta, or cereal. Meats and other proteins Aim to eat 5-6 ounce-equivalents of protein each day. Examples of 1 ounce-equivalent of protein include 1 egg, 1/2 cup nuts or seeds, or 1 tablespoon (16 g) peanut butter. A cut of meat or fish that is the size of a deck of cards is about 3-4 ounce-equivalents. Of the protein you eat each week, try to have at least 8 ounces come from seafood. This includes salmon, trout, herring, and anchovies. Dairy Aim to eat 3 cup-equivalents of fat-free or low-fat dairy each day. Examples of 1 cup-equivalent of dairy include 1 cup (240 mL) milk, 8 ounces (250 g) yogurt, 1 ounces (44 g) natural cheese, or 1 cup (240 mL) fortified soy milk. Fats and oils Aim for about 5 teaspoons (21 g) per day. Choose monounsaturated fats, such as canola and olive oils, avocados, peanut butter, and most nuts, or polyunsaturated fats, such as sunflower, corn, and soybean oils, walnuts, pine nuts, sesame seeds, sunflower seeds, and flaxseed. Beverages Aim for six 8-oz glasses of water per day. Limit coffee to three to five 8-oz cups per day. Limit caffeinated beverages that have added calories, such as soda and energy drinks. Limit alcohol intake to no more than 1 drink a day for nonpregnant women and 2 drinks a day for men. One drink equals 12 oz of beer (355 mL), 5 oz of wine (148 mL), or 1 oz of hard liquor (44  mL). Seasoning and other foods Avoid adding excess amounts of salt to your foods. Try flavoring foods with herbs and spices instead of salt. Avoid adding sugar to foods. Try using oil-based dressings, sauces, and spreads instead of solid fats. This information is based on general [place] nutrition guidelines. For more information, visit [employer]. Exact amounts may vary based on your nutrition needs. Summary A healthy eating plan may help you to maintain a healthy weight, reduce the risk of chronic diseases, and stay active throughout your life. Plan your meals. Make sure you eat the right portions of a variety of nutrient-rich foods. Try baking, boiling, grilling, or broiling instead of frying. Choose healthy options in all settings, including home, work, school, restaurants, or stores. This information is not intended to replace advice given to you by your health care provider. Make sure you discuss any questions you have with your health care provider. Document Revised: 07/09/2021 Document Reviewed: 07/09/2021 Elsevier Patient Education  [place].

## 2022-06-05 NOTE — Progress Notes (Signed)
Complete Physical  Assessment and Plan:  Hala was seen today for annual exam.  Diagnoses and all orders for this visit:  1. Encounter for general adult medical examination with abnormal findings Due annually  2. Hyperlipidemia associated with type 2 diabetes mellitus ([place]) Discussed lifestyle modifications. Recommended diet heavy in fruits and veggies, omega 3's. Decrease consumption of animal meats, cheeses, and dairy products. Remain active and exercise as tolerated. Continue to monitor. Discussed how what you eat and drink can aide in kidney protection. Stay well hydrated. Avoid high salt foods. Avoid NSAIDS. Keep BP and BG well controlled.   Take medications as prescribed. Remain active and exercise as tolerated daily. Maintain weight.  Continue to monitor.   - Lipid panel  3. Morbid obesity ([place])- BMI 35+ with htn, T2DM Discussed appropriate BMI Goal of losing 1 lb per month. Diet modification. Physical activity. Encouraged/praised to build confidence.  - COMPLETE METABOLIC PANEL WITH GFR - Lipid panel - Hemoglobin A1c  4. Type 2 diabetes mellitus with hyperlipidemia ([place]) Stop Jardiance. Start Rybelsus - 3 mg sample provided for 30 days Education: Reviewed 'ABCs' of diabetes management  A1C (<7) Blood pressure (<130/80) Cholesterol (LDL <70) Continue Eye Exam yearly  Continue Dental Exam Q6 mo Discussed dietary recommendations Discussed Physical Activity recommendations Foot exam UTD  Discussed lifestyle modifications. Recommended diet heavy in fruits and veggies, omega 3's. Decrease consumption of animal meats, cheeses, and dairy products. Remain active and exercise as tolerated. Continue to monitor.'  - COMPLETE METABOLIC PANEL WITH GFR - Hemoglobin A1c  5. Benign labile hypertension Discussed DASH (Dietary Approaches to Stop Hypertension) DASH diet is lower in sodium than a typical American diet. Cut back on foods that are high in saturated  fat, cholesterol, and trans fats. Eat more whole-grain foods, fish, poultry, and nuts Remain active and exercise as tolerated daily.  Monitor BP at home-Call if greater than 130/80.   - CBC with Differential/Platelet - COMPLETE METABOLIC PANEL WITH GFR  6. Left sided sciatica Continue to follow with Orthopedics, PT. Rest when flared. Continue Gabapentin. Continue to monitor   7. Vitamin D deficiency Continue to monitor  - VITAMIN D 25 Hydroxy (Vit-D Deficiency, Fractures)  8. Abnormal mammogram Continue yearly screening via GYN  9. Iron deficiency anemia, unspecified iron deficiency anemia type Resolved d/t recent total abdominal hysterectomy and bilateral salpingo-oophorectomhy  10. S/P TAH-BSO (total abdominal hysterectomy and bilateral salpingo-oophorectomy) Continue to follow with GYN Continue to monitor   11. Screening for hematuria or proteinuria  - Urinalysis, Routine w reflex microscopic  12. Screening for cardiovascular condition  - EKG 12-Lead  13. Medication management All medications discussed and reviewed in full. All questions and concerns regarding medications addressed.      Discussed med's effects and SE's. Screening labs and tests as requested with regular follow-up as recommended. Over 40 minutes of exam, counseling, chart review and critical decision making was performed  Future Appointments  Date Time Provider [place]  06/08/2023 10:00 AM Darrol Jump, NP [facility] None     HPI  This very nice 51 y.o. AA female presents for complete physical.  She has Morbid obesity ([place])- BMI 35+ with htn, T2DM; Vitamin D deficiency; B12 deficiency; Medication management; Benign labile hypertension; Type 2 diabetes mellitus with hyperlipidemia ([place]); Hyperlipidemia associated with type 2 diabetes mellitus ([place]); Abnormal mammogram; Iron deficiency anemia; and S/P TAH-BSO (total abdominal hysterectomy and bilateral salpingo-oophorectomy) on  their problem list.   She has a son who continues to play college  football.  He is trying to decide if he wants to go into PT or fire.   Has recently been worked up and treated for sciatica pain.  She is continuing to follow with Orthopedics.  Had injections that did no help.  She is continuing to follow with PT, stretching.  Has started Gabapentin.  She follows with GYN Dr. Garwin Brothers, recently had total hysterectomy.  Doing well.   Abnormal mammogram 07/2020, right upper outer breast cluster calcification, had repeat that was improved, reports had again in March 06 2021 (see in care everywhere) and was stable. She continues with yearly screenings.  BMI is Body mass index is 34.2 kg/m., she has been working on diet and exercise but admits needs to do better. Has cut out most rice/pasta.  She does exercise, 3 days a week. Very rare soda/sweet tea, mostly water.  Wt Readings from Last 3 Encounters:  06/05/22 231 lb 9.6 oz (105.1 kg)  02/12/22 231 lb (104.8 kg)  12/10/21 237 lb (107.5 kg)   She has not been checking BP at home, today their BP is BP: 114/74, similar by manual recheck.   She does workout. She denies chest pain, shortness of breath, dizziness.   She is not on cholesterol medication, strong patient preference to avoid . Her cholesterol is not at goal. The cholesterol last visit was:   Lab Results  Component Value Date   CHOL 226 (H) 12/10/2021   HDL 55 12/10/2021   LDLCALC 152 (H) 12/10/2021   TRIG 86 12/10/2021   CHOLHDL 4.1 12/10/2021    She has been working on diet and exercise for T2 diabetes (on jardiance, patient preference to minimize pills, stopped metformin), and denies foot ulcerations, hyperglycemia, hypoglycemia , increased appetite, nausea, paresthesia of the feet, polydipsia, polyuria, and visual disturbances. She does check fasting glucose, ranges 90-120s. Last A1C in the office was:  Lab Results  Component Value Date   HGBA1C 6.8 (H) 12/10/2021    Last GFR:   Lab Results  Component Value Date   GFRAA 105 01/31/2021   Patient is on Vitamin D supplement.   Lab Results  Component Value Date   VD25OH 122 (H) 06/05/2021     She takes B12 supplement regularly Lab Results  Component Value Date   [id number] 539 06/05/2021      Current Medications:  Current Outpatient Medications on File Prior to Visit  Medication Sig Dispense Refill   Cyanocobalamin (VITAMIN B12 PO) Take by mouth daily. Takes 3-4 tablets twice a week     ferrous sulfate 325 (65 FE) MG tablet Take 325 mg by mouth daily with breakfast.     JARDIANCE 25 MG TABS tablet TAKE 1 TABLET DAILY 90 tablet 3   Latanoprost 0.005 % EMUL Place 0.005 drops into both eyes at bedtime.     losartan-hydrochlorothiazide (HYZAAR) 100-25 MG tablet TAKE 1 TABLET DAILY 90 tablet 3   Multiple Vitamin (MULTIVITAMIN) tablet Take 1 tablet by mouth daily.     timolol (TIMOPTIC) 0.5 % ophthalmic solution Place 0.5 drops into both eyes 2 (two) times daily.     Vitamin D, Ergocalciferol, (DRISDOL) 1.25 MG (50000 UNIT) CAPS capsule TAKE 1 CAPSULE ONCE A WEEK 13 capsule 3   meloxicam (MOBIC) 15 MG tablet Take one daily with food for 2 weeks, can take with tylenol, can not take with aleve, iburpofen, then as needed daily for pain (Patient not taking: Reported on 06/05/2022) 30 tablet 1   rosuvastatin (CRESTOR) 5 MG tablet Take  1 tab three nights a week for cholesterol. (Patient not taking: Reported on 06/05/2022) 39 tablet 3   No current facility-administered medications on file prior to visit.   Health Maintenance:   Immunization History  Administered Date(s) Administered   Influenza Inj Mdck Quad With Preservative 09/30/2019, 10/16/2020   Influenza,inj,Quad PF,6+ Mos 09/10/2021   Influenza-Unspecified 09/13/2015   PFIZER(Purple Top)SARS-COV-2 Vaccination 02/18/2020, 03/13/2020   PPD Test 04/25/2014   Tdap 04/25/2014, 09/13/2015     TD/TDAP: 2015 Influenza: 2022 PPD 2015 Pneumovax: declines at this  time Prevnar 13: N/A Shingrix; check with insurance Covid 19: 2/2, 2021  LMP: Total hysterectomy 12/31/2021 MGM: 02/2022  Colonoscopy: 06/2020 no polyps, Dr. Loletha Carrow, was recommended 5 year recall,  Due to inadequate prep   Last Dental Exam:  q 6 months  Last Eye Exam: Dr. Dimple Nanas My eye doctor, 2023, glasses Dr. Nancy Fetter retinal specialist for glaucoma q70m Medical History:  Past Medical History:  Diagnosis Date   Asthma    childhood   Glaucoma    Hypertension    Uterine fibroid    Uterine leiomyoma 03/23/2018   Following with GYN S/p myomectomy S/p TAH bil salpingooopherectomy 12/31/2021   Allergies No Known Allergies  SURGICAL HISTORY She  has a past surgical history that includes Wisdom tooth extraction; IR Radiologist Eval & Mgmt (09/11/2021); IR Radiologist Eval & Mgmt (10/16/2021); and Total abdominal hysterectomy w/ bilateral salpingoophorectomy (12/31/2021). FAMILY HISTORY Her family history includes Diabetes in her father, maternal grandmother, and mother; Hypertension in her father and mother. SOCIAL HISTORY She  reports that she has never smoked. She has never used smokeless tobacco. She reports current alcohol use. She reports that she does not use drugs. 4 x a month  Review of Systems: Review of Systems  Constitutional: Negative.  Negative for malaise/fatigue and weight loss.  HENT: Negative.  Negative for hearing loss and tinnitus.   Eyes: Negative.  Negative for blurred vision and double vision.  Respiratory: Negative.  Negative for cough, shortness of breath and wheezing.   Cardiovascular: Negative.  Negative for chest pain, palpitations, orthopnea, claudication and leg swelling.  Gastrointestinal: Negative.  Negative for abdominal pain, blood in stool, constipation, diarrhea, heartburn, melena, nausea and vomiting.  Genitourinary: Negative.   Musculoskeletal: Negative.  Negative for joint pain and myalgias.  Skin:  Negative for itching and rash.  Neurological:  Negative.  Negative for dizziness, tingling, sensory change, weakness and headaches.  Endo/Heme/Allergies: Negative.  Negative for polydipsia.  Psychiatric/Behavioral: Negative.    All other systems reviewed and are negative.   Physical Exam: Estimated body mass index is 34.2 kg/m as calculated from the following:   Height as of this encounter: '5\' 9"'$  (1.753 m).   Weight as of this encounter: 231 lb 9.6 oz (105.1 kg). BP 114/74   Pulse (!) 59   Temp (!) 97.5 F (36.4 C)   Ht '5\' 9"'$  (1.753 m)   Wt 231 lb 9.6 oz (105.1 kg)   SpO2 97%   BMI 34.20 kg/m  General Appearance: Well nourished, in no apparent distress.  Eyes: PERRLA, EOMs, conjunctiva no swelling or erythema, normal fundi and vessels.  Sinuses: No Frontal/maxillary tenderness  ENT/Mouth: Ext aud canals clear, normal light reflex with TMs without erythema, bulging. Good dentition. No erythema, swelling, or exudate on post pharynx. Tonsils not swollen or erythematous. Hearing normal.  Neck: Supple, thyroid normal. No bruits  Respiratory: Respiratory effort normal, BS equal bilaterally without rales, rhonchi, wheezing or stridor.  Cardio: RRR without murmurs,  rubs or gallops. Brisk peripheral pulses without edema.  Chest: symmetric, with normal excursions and percussion.  Breasts: defer to GYN Abdomen: Soft, nontender, + fixed enlarged uterus, nontender, rebound, hernias, masses, or organomegaly.  Lymphatics: Non tender without lymphadenopathy.  Genitourinary: defer to GYN Musculoskeletal: Full ROM all peripheral extremities,5/5 strength, and normal gait.  Skin: Warm, dry without rashes, lesions, ecchymosis. Neuro: Cranial nerves intact, reflexes equal bilaterally. Normal muscle tone, no cerebellar symptoms. Sensation intact.  Psych: Awake and oriented X 3, normal affect, Insight and Judgment appropriate.   EKG: Sinus brady, no ST changes  Darrol Jump, NP 10:19 AM [facility]

## 2022-06-06 LAB — COMPLETE METABOLIC PANEL WITH GFR
AG Ratio: 1 (calc) (ref 1.0–2.5)
ALT: 27 U/L (ref 6–29)
AST: 16 U/L (ref 10–35)
Albumin: 4.1 g/dL (ref 3.6–5.1)
Alkaline phosphatase (APISO): 83 U/L (ref 37–153)
BUN: 16 mg/dL (ref 7–25)
CO2: 31 mmol/L (ref 20–32)
Calcium: 9.9 mg/dL (ref 8.6–10.4)
Chloride: 98 mmol/L (ref 98–110)
Creat: 0.85 mg/dL (ref 0.50–1.03)
Globulin: 4 g/dL (calc) — ABNORMAL HIGH (ref 1.9–3.7)
Glucose, Bld: 115 mg/dL — ABNORMAL HIGH (ref 65–99)
Potassium: 3.9 mmol/L (ref 3.5–5.3)
Sodium: 139 mmol/L (ref 135–146)
Total Bilirubin: 0.6 mg/dL (ref 0.2–1.2)
Total Protein: 8.1 g/dL (ref 6.1–8.1)
eGFR: 82 mL/min/{1.73_m2} (ref >=60)

## 2022-06-06 LAB — URINALYSIS, ROUTINE W REFLEX MICROSCOPIC
Bilirubin Urine: NEGATIVE
Hgb urine dipstick: NEGATIVE
Ketones, ur: NEGATIVE
Leukocytes,Ua: NEGATIVE
Nitrite: NEGATIVE
Protein, ur: NEGATIVE
Specific Gravity, Urine: 1.015 (ref 1.001–1.035)
pH: 6 (ref 5.0–8.0)

## 2022-06-06 LAB — CBC WITH DIFFERENTIAL/PLATELET
Absolute Monocytes: 495 cells/uL (ref 200–950)
Basophils Absolute: 38 cells/uL (ref 0–200)
Basophils Relative: 0.5 %
Eosinophils Absolute: 458 cells/uL (ref 15–500)
Eosinophils Relative: 6.1 %
HCT: 50.2 % — ABNORMAL HIGH (ref 35.0–45.0)
Hemoglobin: 16.4 g/dL — ABNORMAL HIGH (ref 11.7–15.5)
Lymphs Abs: 2213 cells/uL (ref 850–3900)
MCH: 27.5 pg (ref 27.0–33.0)
MCHC: 32.7 g/dL (ref 32.0–36.0)
MCV: 84.1 fL (ref 80.0–100.0)
MPV: 12.1 fL (ref 7.5–12.5)
Monocytes Relative: 6.6 %
Neutro Abs: 4298 cells/uL (ref 1500–7800)
Neutrophils Relative %: 57.3 %
Platelets: 299 10*3/uL (ref 140–400)
RBC: 5.97 10*6/uL — ABNORMAL HIGH (ref 3.80–5.10)
RDW: 12.8 % (ref 11.0–15.0)
Total Lymphocyte: 29.5 %
WBC: 7.5 10*3/uL (ref 3.8–10.8)

## 2022-06-06 LAB — LIPID PANEL
Cholesterol: 246 mg/dL — ABNORMAL HIGH (ref <200)
HDL: 57 mg/dL (ref >=50)
LDL Cholesterol (Calc): 171 mg/dL (calc) — ABNORMAL HIGH
Non-HDL Cholesterol (Calc): 189 mg/dL (calc) — ABNORMAL HIGH (ref <130)
Total CHOL/HDL Ratio: 4.3 (calc) (ref <5.0)
Triglycerides: 76 mg/dL (ref <150)

## 2022-06-06 LAB — VITAMIN D 25 HYDROXY (VIT D DEFICIENCY, FRACTURES): Vit D, 25-Hydroxy: 68 ng/mL (ref 30–100)

## 2022-06-06 LAB — HEMOGLOBIN A1C
Hgb A1c MFr Bld: 7 % of total Hgb — ABNORMAL HIGH (ref <5.7)
Mean Plasma Glucose: 154 mg/dL
eAG (mmol/L): 8.5 mmol/L

## 2022-07-07 ENCOUNTER — Encounter: Payer: Self-pay | Admitting: Nurse Practitioner

## 2022-07-08 MED ORDER — RYBELSUS 7 MG PO TABS: 7.0000 mg | ORAL_TABLET | Freq: Every day | ORAL | 0 refills | Status: DC

## 2022-07-11 MED ORDER — RYBELSUS 7 MG PO TABS: ORAL_TABLET | ORAL | 0 refills | Status: DC

## 2022-08-19 ENCOUNTER — Other Ambulatory Visit: Payer: Self-pay

## 2022-08-19 DIAGNOSIS — E1169 Type 2 diabetes mellitus with other specified complication: Secondary | ICD-10-CM

## 2022-08-19 MED ORDER — EMPAGLIFLOZIN 25 MG PO TABS: 25.0000 mg | ORAL_TABLET | Freq: Every day | ORAL | 3 refills | Status: DC

## 2022-09-10 LAB — HM MAMMOGRAPHY

## 2022-09-15 ENCOUNTER — Encounter: Payer: Self-pay | Admitting: Nurse Practitioner

## 2022-09-16 ENCOUNTER — Encounter: Payer: Self-pay | Admitting: Internal Medicine

## 2022-12-11 ENCOUNTER — Ambulatory Visit: Payer: 59 | Admitting: Nurse Practitioner

## 2022-12-19 ENCOUNTER — Other Ambulatory Visit: Payer: Self-pay

## 2022-12-19 MED ORDER — LOSARTAN POTASSIUM-HCTZ 100-25 MG PO TABS: ORAL_TABLET | ORAL | 3 refills | Status: DC

## 2022-12-22 ENCOUNTER — Ambulatory Visit: Payer: 59 | Admitting: Nurse Practitioner

## 2022-12-29 ENCOUNTER — Ambulatory Visit: Payer: 59 | Admitting: Nurse Practitioner

## 2023-01-08 ENCOUNTER — Ambulatory Visit (INDEPENDENT_AMBULATORY_CARE_PROVIDER_SITE_OTHER): Payer: 59 | Admitting: Nurse Practitioner

## 2023-01-08 ENCOUNTER — Encounter: Payer: Self-pay | Admitting: Nurse Practitioner

## 2023-01-08 VITALS — BP 116/70 | HR 55 | Temp 97.3°F | Ht 68.5 in | Wt 229.6 lb

## 2023-01-08 DIAGNOSIS — E785 Hyperlipidemia, unspecified: Secondary | ICD-10-CM

## 2023-01-08 DIAGNOSIS — E1169 Type 2 diabetes mellitus with other specified complication: Secondary | ICD-10-CM

## 2023-01-08 DIAGNOSIS — I10 Essential (primary) hypertension: Secondary | ICD-10-CM | POA: Diagnosis not present

## 2023-01-08 DIAGNOSIS — B3731 Acute candidiasis of vulva and vagina: Secondary | ICD-10-CM

## 2023-01-08 DIAGNOSIS — M5432 Sciatica, left side: Secondary | ICD-10-CM

## 2023-01-08 DIAGNOSIS — Z79899 Other long term (current) drug therapy: Secondary | ICD-10-CM

## 2023-01-08 DIAGNOSIS — E559 Vitamin D deficiency, unspecified: Secondary | ICD-10-CM

## 2023-01-08 MED ORDER — OZEMPIC (0.25 OR 0.5 MG/DOSE) 2 MG/1.5ML ~~LOC~~ SOPN: 0.2500 mg | PEN_INJECTOR | SUBCUTANEOUS | 2 refills | Status: DC

## 2023-01-08 MED ORDER — FLUCONAZOLE 150 MG PO TABS: ORAL_TABLET | ORAL | 0 refills | Status: DC

## 2023-01-08 NOTE — Patient Instructions (Signed)
Semaglutide Injection What is this medication? SEMAGLUTIDE (SEM a GLOO tide) treats type 2 diabetes. It works by increasing insulin levels in your body, which decreases your blood sugar (glucose). It also reduces the amount of sugar released into the blood and slows down your digestion. It can also be used to lower the risk of heart attack and stroke in people with type 2 diabetes. Changes to diet and exercise are often combined with this medication. This medicine may be used for other purposes; ask your health care provider or pharmacist if you have questions. COMMON BRAND NAME(S): OZEMPIC What should I tell my care team before I take this medication? They need to know if you have any of these conditions: Endocrine tumors (MEN 2) or if someone in your family had these tumors Eye disease, vision problems History of pancreatitis Kidney disease Stomach problems Thyroid cancer or if someone in your family had thyroid cancer An unusual or allergic reaction to semaglutide, other medications, foods, dyes, or preservatives Pregnant or trying to get pregnant Breast-feeding How should I use this medication? This medication is for injection under the skin of your upper leg (thigh), stomach area, or upper arm. It is given once every week (every 7 days). You will be taught how to prepare and give this medication. Use exactly as directed. Take your medication at regular intervals. Do not take it more often than directed. If you use this medication with insulin, you should inject this medication and the insulin separately. Do not mix them together. Do not give the injections right next to each other. Change (rotate) injection sites with each injection. It is important that you put your used needles and syringes in a special sharps container. Do not put them in a trash can. If you do not have a sharps container, call your pharmacist or care team to get one. A special MedGuide will be given to you by the  pharmacist with each prescription and refill. Be sure to read this information carefully each time. This medication comes with INSTRUCTIONS FOR USE. Ask your pharmacist for directions on how to use this medication. Read the information carefully. Talk to your pharmacist or care team if you have questions. Talk to your care team about the use of this medication in children. Special care may be needed. Overdosage: If you think you have taken too much of this medicine contact a poison control center or emergency room at once. NOTE: This medicine is only for you. Do not share this medicine with others. What if I miss a dose? If you miss a dose, take it as soon as you can within 5 days after the missed dose. Then take your next dose at your regular weekly time. If it has been longer than 5 days after the missed dose, do not take the missed dose. Take the next dose at your regular time. Do not take double or extra doses. If you have questions about a missed dose, contact your care team for advice. What may interact with this medication? Other medications for diabetes Many medications may cause changes in blood sugar, these include: Alcohol containing beverages Antiviral medications for HIV or AIDS Aspirin and aspirin-like medications Certain medications for blood pressure, heart disease, irregular heart beat Chromium Diuretics Female hormones, such as estrogens or progestins, birth control pills Fenofibrate Gemfibrozil Isoniazid Lanreotide Female hormones or anabolic steroids MAOIs like Carbex, Eldepryl, Marplan, Nardil, and Parnate Medications for weight loss Medications for allergies, asthma, cold, or cough Medications for depression,  anxiety, or psychotic disturbances Niacin Nicotine NSAIDs, medications for pain and inflammation, like ibuprofen or naproxen Octreotide Pasireotide Pentamidine Phenytoin Probenecid Quinolone antibiotics such as ciprofloxacin, levofloxacin, ofloxacin Some  herbal dietary supplements Steroid medications such as prednisone or cortisone Sulfamethoxazole; trimethoprim Thyroid hormones Some medications can hide the warning symptoms of low blood sugar (hypoglycemia). You may need to monitor your blood sugar more closely if you are taking one of these medications. These include: Beta-blockers, often used for high blood pressure or heart problems (examples include atenolol, metoprolol, propranolol) Clonidine Guanethidine Reserpine This list may not describe all possible interactions. Give your health care provider a list of all the medicines, herbs, non-prescription drugs, or dietary supplements you use. Also tell them if you smoke, drink alcohol, or use illegal drugs. Some items may interact with your medicine. What should I watch for while using this medication? Visit your care team for regular checks on your progress. Drink plenty of fluids while taking this medication. Check with your care team if you get an attack of severe diarrhea, nausea, and vomiting. The loss of too much body fluid can make it dangerous for you to take this medication. A test called the HbA1C (A1C) will be monitored. This is a simple blood test. It measures your blood sugar control over the last 2 to 3 months. You will receive this test every 3 to 6 months. Learn how to check your blood sugar. Learn the symptoms of low and high blood sugar and how to manage them. Always carry a quick-source of sugar with you in case you have symptoms of low blood sugar. Examples include hard sugar candy or glucose tablets. Make sure others know that you can choke if you eat or drink when you develop serious symptoms of low blood sugar, such as seizures or unconsciousness. They must get medical help at once. Tell your care team if you have high blood sugar. You might need to change the dose of your medication. If you are sick or exercising more than usual, you might need to change the dose of your  medication. Do not skip meals. Ask your care team if you should avoid alcohol. Many nonprescription cough and cold products contain sugar or alcohol. These can affect blood sugar. Pens should never be shared. Even if the needle is changed, sharing may result in passing of viruses like hepatitis or HIV. Wear a medical ID bracelet or chain, and carry a card that describes your disease and details of your medication and dosage times. Do not become pregnant while taking this medication. Women should inform their care team if they wish to become pregnant or think they might be pregnant. There is a potential for serious side effects to an unborn child. Talk to your care team for more information. What side effects may I notice from receiving this medication? Side effects that you should report to your care team as soon as possible: Allergic reactions--skin rash, itching, hives, swelling of the face, lips, tongue, or throat Change in vision Dehydration--increased thirst, dry mouth, feeling faint or lightheaded, headache, dark yellow or brown urine Gallbladder problems--severe stomach pain, nausea, vomiting, fever Heart palpitations--rapid, pounding, or irregular heartbeat Kidney injury--decrease in the amount of urine, swelling of the ankles, hands, or feet Pancreatitis--severe stomach pain that spreads to your back or gets worse after eating or when touched, fever, nausea, vomiting Thyroid cancer--new mass or lump in the neck, pain or trouble swallowing, trouble breathing, hoarseness Side effects that usually do not require medical  attention (report to your care team if they continue or are bothersome): Diarrhea Loss of appetite Nausea Stomach pain Vomiting This list may not describe all possible side effects. Call your doctor for medical advice about side effects. You may report side effects to FDA at 1-800-FDA-1088. Where should I keep my medication? Keep out of the reach of children. Store  unopened pens in a refrigerator between 2 and 8 degrees C (36 and 46 degrees F). Do not freeze. Protect from light and heat. After you first use the pen, it can be stored for 56 days at room temperature between 15 and 30 degrees C (59 and 86 degrees F) or in a refrigerator. Throw away your used pen after 56 days or after the expiration date, whichever comes first. Do not store your pen with the needle attached. If the needle is left on, medication may leak from the pen. NOTE: This sheet is a summary. It may not cover all possible information. If you have questions about this medicine, talk to your doctor, pharmacist, or health care provider.  2023 Elsevier/Gold Standard (2021-01-24 00:00:00)

## 2023-01-08 NOTE — Progress Notes (Signed)
Complete Physical  Assessment and Plan:  Rayya was seen today for annual exam.  Diagnoses and all orders for this visit:  Hyperlipidemia associated with type 2 diabetes mellitus ([place]) No longer taking statin - requested to treat via diet. Discussed lifestyle modifications. Recommended diet heavy in fruits and veggies, omega 3's. Decrease consumption of animal meats, cheeses, and dairy products. Remain active and exercise as tolerated. Continue to monitor. Discussed how what you eat and drink can aide in kidney protection. Stay well hydrated. Avoid high salt foods. Avoid NSAIDS. Keep BP and BG well controlled.   Take medications as prescribed. Remain active and exercise as tolerated daily. Maintain weight.  Continue to monitor.   Morbid obesity ([place])- BMI 35+ with htn, T2DM Discussed appropriate BMI Diet modification. Physical activity. Encouraged/praised to build confidence.  Type 2 diabetes mellitus with hyperlipidemia ([place]) Stop Jardiance. Start Ozempic 0.25 mg/week Education: Reviewed 'ABCs' of diabetes management  Discussed goals to be met and/or maintained include A1C (<7) Blood pressure (<130/80) Cholesterol (LDL <70) Continue Eye Exam yearly  Continue Dental Exam Q6 mo Discussed dietary recommendations Discussed Physical Activity recommendations Check A1C  Benign labile hypertension Controlled Continue losartan, hctz Discussed DASH (Dietary Approaches to Stop Hypertension) DASH diet is lower in sodium than a typical American diet. Cut back on foods that are high in saturated fat, cholesterol, and trans fats. Eat more whole-grain foods, fish, poultry, and nuts Remain active and exercise as tolerated daily.  Monitor BP at home-Call if greater than 130/80.   Left sided sciatica Continue massage therapy with [facility] Continue to follow with Orthopedics, PT. Continue Gabapentin PRN Continue to monitor  Vitamin D deficiency Continue  supplement for goal of 60-100 Monitor levels  Medication management All medications discussed and reviewed in full. All questions and concerns regarding medications addressed.  Vaginal candida Start Diflucan PRN Keep BG well controlled Continue to monitor     Orders Placed This Encounter  Procedures   CBC with Differential/Platelet   COMPLETE METABOLIC PANEL WITH GFR   Lipid panel   Hemoglobin A1c   VITAMIN D 25 Hydroxy (Vit-D Deficiency, Fractures)   Meds ordered this encounter  Medications   Semaglutide,0.25 or 0.5MG/DOS, (OZEMPIC, 0.25 OR 0.5 MG/DOSE,) 2 MG/1.5ML SOPN    Sig: Inject 0.25 mg into the skin once a week.    Dispense:  2 mL    Refill:  2    Order Specific Question:   Supervising Provider    Answer:   Unk Pinto [[id number]]   fluconazole (DIFLUCAN) 150 MG tablet    Sig: Take 1 tablet (150 mg) at the sign of symptoms.  If not resolved after 72 hours may take additional 150 mg dosage.    Dispense:  30 tablet    Refill:  0    Order Specific Question:   Supervising Provider    Answer:   Unk Pinto [phone]   Notify office for further evaluation and treatment, questions or concerns if any reported s/s fail to improve.   The patient was advised to call back or seek an in-person evaluation if any symptoms worsen or if the condition fails to improve as anticipated.   Further disposition pending results of labs. Discussed med's effects and SE's.    I discussed the assessment and treatment plan with the patient. The patient was provided an opportunity to ask questions and all were answered. The patient agreed with the plan and demonstrated an understanding of the instructions.  Discussed med's effects and  SE's. Screening labs and tests as requested with regular follow-up as recommended.  I provided 20 minutes of face-to-face time during this encounter including counseling, chart review, and critical decision making was preformed.  Future Appointments  Date  Time Provider [place]  06/08/2023 10:00 AM Darrol Jump, NP [facility] None     HPI  This very nice 52 y.o. AA female presents for general follow up.  She has Morbid obesity ([place])- BMI 35+ with htn, T2DM; Vitamin D deficiency; B12 deficiency; Medication management; Benign labile hypertension; Type 2 diabetes mellitus with hyperlipidemia ([place]); Hyperlipidemia associated with type 2 diabetes mellitus ([place]); Abnormal mammogram; Iron deficiency anemia; and S/P TAH-BSO (total abdominal hysterectomy and bilateral salpingo-oophorectomy) on their problem list.   She has a son who continues to play college football.  He is trying to decide if he wants to go into PT or fire.   Has recently been worked up and treated for sciatica pain.  She is continuing to follow with Orthopedics.  Had injections that did no help.  She is continuing to follow with PT, stretching.  Has started Gabapentin.  She is going to a massage therapist.  [facility] feels as though this has helped most to relieve the pain.  She is continuing to go monthly    She follows with GYN Dr. Garwin Brothers, recently had total hysterectomy.  Doing well.  Denies any residual effects.    Abnormal mammogram 07/2020, right upper outer breast cluster calcification, had repeat that was improved, reports had again in March 06 2021 (see in care everywhere) and was stable. She continues with yearly screenings.  Last completed 08/2022 and normal   BMI is Body mass index is 34.4 kg/m., she has been working on diet and exercise but holidays set her back. Wt Readings from Last 3 Encounters:  01/08/23 229 lb 9.6 oz (104.1 kg)  06/05/22 231 lb 9.6 oz (105.1 kg)  02/12/22 231 lb (104.8 kg)   She has not been checking BP at home, today their BP is BP: 116/70, similar by manual recheck.   She does workout. She denies chest pain, shortness of breath, dizziness.   She is not on cholesterol medication, strong patient preference to avoid . Her cholesterol  is not at goal. The cholesterol last visit was:   Lab Results  Component Value Date   CHOL 246 (H) 06/05/2022   HDL 57 06/05/2022   LDLCALC 171 (H) 06/05/2022   TRIG 76 06/05/2022   CHOLHDL 4.3 06/05/2022    She has been working on diet and exercise for T2 diabetes (on jardiance, patient preference to minimize pills, stopped metformin was on jardiance, this increases vaginal yeast, has intermittently.  Switched to Ryblesus, forgets to take at times.Denies foot ulcerations, hyperglycemia, hypoglycemia , increased appetite, nausea, paresthesia of the feet, polydipsia, polyuria, and visual disturbances. She does check fasting glucose, ranges 90-120s. Last A1C in the office was:  Lab Results  Component Value Date   HGBA1C 7.0 (H) 06/05/2022    Last GFR:  Lab Results  Component Value Date   GFRAA 105 01/31/2021   Patient is on Vitamin D supplement.   Lab Results  Component Value Date   VD25OH 68 06/05/2022     She takes B12 supplement regularly Lab Results  Component Value Date   [id number] 06/05/2021      Current Medications:  Current Outpatient Medications on File Prior to Visit  Medication Sig Dispense Refill   Cyanocobalamin (VITAMIN B12 PO) Take by mouth  daily. Takes 3-4 tablets twice a week     empagliflozin (JARDIANCE) 25 MG TABS tablet Take 1 tablet (25 mg total) by mouth daily. 90 tablet 3   ferrous sulfate 325 (65 FE) MG tablet Take 325 mg by mouth daily with breakfast.     Latanoprost 0.005 % EMUL Place 0.005 drops into both eyes at bedtime.     losartan-hydrochlorothiazide (HYZAAR) 100-25 MG tablet TAKE 1 TABLET DAILY 90 tablet 3   Multiple Vitamin (MULTIVITAMIN) tablet Take 1 tablet by mouth daily.     timolol (TIMOPTIC) 0.5 % ophthalmic solution Place 0.5 drops into both eyes 2 (two) times daily.     Vitamin D, Ergocalciferol, (DRISDOL) 1.25 MG (50000 UNIT) CAPS capsule TAKE 1 CAPSULE ONCE A WEEK 13 capsule 3   meloxicam (MOBIC) 15 MG tablet Take one daily  with food for 2 weeks, can take with tylenol, can not take with aleve, iburpofen, then as needed daily for pain (Patient not taking: Reported on 06/05/2022) 30 tablet 1   rosuvastatin (CRESTOR) 5 MG tablet Take 1 tab three nights a week for cholesterol. (Patient not taking: Reported on 06/05/2022) 39 tablet 3   Semaglutide (RYBELSUS) 7 MG TABS Take 1 tab (7 mg) by mouth daily as directed. 90 tablet 0   No current facility-administered medications on file prior to visit.    Medical History:  Past Medical History:  Diagnosis Date   Asthma    childhood   Glaucoma    Hypertension    Uterine fibroid    Uterine leiomyoma 03/23/2018   Following with GYN S/p myomectomy S/p TAH bil salpingooopherectomy 12/31/2021   Allergies No Known Allergies  SURGICAL HISTORY She  has a past surgical history that includes Wisdom tooth extraction; IR Radiologist Eval & Mgmt (09/11/2021); IR Radiologist Eval & Mgmt (10/16/2021); and Total abdominal hysterectomy w/ bilateral salpingoophorectomy (12/31/2021). FAMILY HISTORY Her family history includes Diabetes in her father, maternal grandmother, and mother; Hypertension in her father and mother. SOCIAL HISTORY She  reports that she has never smoked. She has never used smokeless tobacco. She reports current alcohol use. She reports that she does not use drugs. 4 x a month  Review of Systems: Review of Systems  Constitutional: Negative.  Negative for malaise/fatigue and weight loss.  HENT: Negative.  Negative for hearing loss and tinnitus.   Eyes: Negative.  Negative for blurred vision and double vision.  Respiratory: Negative.  Negative for cough, shortness of breath and wheezing.   Cardiovascular: Negative.  Negative for chest pain, palpitations, orthopnea, claudication and leg swelling.  Gastrointestinal: Negative.  Negative for abdominal pain, blood in stool, constipation, diarrhea, heartburn, melena, nausea and vomiting.  Genitourinary: Negative.    Musculoskeletal: Negative.  Negative for joint pain and myalgias.  Skin:  Negative for itching and rash.  Neurological: Negative.  Negative for dizziness, tingling, sensory change, weakness and headaches.  Endo/Heme/Allergies: Negative.  Negative for polydipsia.  Psychiatric/Behavioral: Negative.    All other systems reviewed and are negative.   Physical Exam: Estimated body mass index is 34.4 kg/m as calculated from the following:   Height as of this encounter: 5' 8.5" (1.74 m).   Weight as of this encounter: 229 lb 9.6 oz (104.1 kg). BP 116/70   Pulse (!) 55   Temp (!) 97.3 F (36.3 C)   Ht 5' 8.5" (1.74 m)   Wt 229 lb 9.6 oz (104.1 kg)   SpO2 99%   BMI 34.40 kg/m  General Appearance: Well nourished, in no  apparent distress.  Eyes: PERRLA, EOMs, conjunctiva no swelling or erythema, normal fundi and vessels.  Sinuses: No Frontal/maxillary tenderness  ENT/Mouth: Ext aud canals clear, normal light reflex with TMs without erythema, bulging. Good dentition. No erythema, swelling, or exudate on post pharynx. Tonsils not swollen or erythematous. Hearing normal.  Neck: Supple, thyroid normal. No bruits  Respiratory: Respiratory effort normal, BS equal bilaterally without rales, rhonchi, wheezing or stridor.  Cardio: RRR without murmurs, rubs or gallops. Brisk peripheral pulses without edema.  Chest: symmetric, with normal excursions and percussion.  Breasts: defer to GYN Abdomen: Soft, nontender, + fixed enlarged uterus, nontender, rebound, hernias, masses, or organomegaly.  Lymphatics: Non tender without lymphadenopathy.  Genitourinary: defer to GYN Musculoskeletal: Full ROM all peripheral extremities,5/5 strength, and normal gait.  Skin: Warm, dry without rashes, lesions, ecchymosis. Neuro: Cranial nerves intact, reflexes equal bilaterally. Normal muscle tone, no cerebellar symptoms. Sensation intact.  Psych: Awake and oriented X 3, normal affect, Insight and Judgment appropriate.    Darrol Jump, NP 4:52 PM [facility]

## 2023-01-09 LAB — LIPID PANEL
Cholesterol: 195 mg/dL (ref <200)
HDL: 54 mg/dL (ref >=50)
LDL Cholesterol (Calc): 121 mg/dL (calc) — ABNORMAL HIGH
Non-HDL Cholesterol (Calc): 141 mg/dL (calc) — ABNORMAL HIGH (ref <130)
Total CHOL/HDL Ratio: 3.6 (calc) (ref <5.0)
Triglycerides: 102 mg/dL (ref <150)

## 2023-01-09 LAB — COMPLETE METABOLIC PANEL WITH GFR
AG Ratio: 1.1 (calc) (ref 1.0–2.5)
ALT: 20 U/L (ref 6–29)
AST: 23 U/L (ref 10–35)
Albumin: 4 g/dL (ref 3.6–5.1)
Alkaline phosphatase (APISO): 87 U/L (ref 37–153)
BUN: 12 mg/dL (ref 7–25)
CO2: 28 mmol/L (ref 20–32)
Calcium: 9.6 mg/dL (ref 8.6–10.4)
Chloride: 103 mmol/L (ref 98–110)
Creat: 0.74 mg/dL (ref 0.50–1.03)
Globulin: 3.6 g/dL (calc) (ref 1.9–3.7)
Glucose, Bld: 133 mg/dL — ABNORMAL HIGH (ref 65–99)
Potassium: 3.8 mmol/L (ref 3.5–5.3)
Sodium: 140 mmol/L (ref 135–146)
Total Bilirubin: 0.4 mg/dL (ref 0.2–1.2)
Total Protein: 7.6 g/dL (ref 6.1–8.1)
eGFR: 97 mL/min/{1.73_m2} (ref >=60)

## 2023-01-09 LAB — CBC WITH DIFFERENTIAL/PLATELET
Absolute Monocytes: 615 cells/uL (ref 200–950)
Basophils Absolute: 41 cells/uL (ref 0–200)
Basophils Relative: 0.5 %
Eosinophils Absolute: 139 cells/uL (ref 15–500)
Eosinophils Relative: 1.7 %
HCT: 44.9 % (ref 35.0–45.0)
Hemoglobin: 15 g/dL (ref 11.7–15.5)
Lymphs Abs: 3723 cells/uL (ref 850–3900)
MCH: 26.7 pg — ABNORMAL LOW (ref 27.0–33.0)
MCHC: 33.4 g/dL (ref 32.0–36.0)
MCV: 79.9 fL — ABNORMAL LOW (ref 80.0–100.0)
MPV: 12.1 fL (ref 7.5–12.5)
Monocytes Relative: 7.5 %
Neutro Abs: 3682 cells/uL (ref 1500–7800)
Neutrophils Relative %: 44.9 %
Platelets: 325 10*3/uL (ref 140–400)
RBC: 5.62 10*6/uL — ABNORMAL HIGH (ref 3.80–5.10)
RDW: 14.4 % (ref 11.0–15.0)
Total Lymphocyte: 45.4 %
WBC: 8.2 10*3/uL (ref 3.8–10.8)

## 2023-01-09 LAB — VITAMIN D 25 HYDROXY (VIT D DEFICIENCY, FRACTURES): Vit D, 25-Hydroxy: 96 ng/mL (ref 30–100)

## 2023-01-09 LAB — HEMOGLOBIN A1C
Hgb A1c MFr Bld: 8.8 % of total Hgb — ABNORMAL HIGH (ref <5.7)
Mean Plasma Glucose: 206 mg/dL
eAG (mmol/L): 11.4 mmol/L

## 2023-04-22 ENCOUNTER — Encounter: Payer: Self-pay | Admitting: Nurse Practitioner

## 2023-04-22 ENCOUNTER — Ambulatory Visit (INDEPENDENT_AMBULATORY_CARE_PROVIDER_SITE_OTHER): Payer: 59 | Admitting: Nurse Practitioner

## 2023-04-22 VITALS — BP 108/64 | HR 64 | Temp 97.6°F | Ht 68.5 in | Wt 227.8 lb

## 2023-04-22 DIAGNOSIS — E785 Hyperlipidemia, unspecified: Secondary | ICD-10-CM

## 2023-04-22 DIAGNOSIS — E1169 Type 2 diabetes mellitus with other specified complication: Secondary | ICD-10-CM

## 2023-04-22 DIAGNOSIS — Z79899 Other long term (current) drug therapy: Secondary | ICD-10-CM | POA: Diagnosis not present

## 2023-04-22 DIAGNOSIS — I10 Essential (primary) hypertension: Secondary | ICD-10-CM

## 2023-04-22 DIAGNOSIS — M5431 Sciatica, right side: Secondary | ICD-10-CM

## 2023-04-22 DIAGNOSIS — E559 Vitamin D deficiency, unspecified: Secondary | ICD-10-CM

## 2023-04-22 DIAGNOSIS — M5432 Sciatica, left side: Secondary | ICD-10-CM

## 2023-04-22 MED ORDER — VITAMIN D (ERGOCALCIFEROL) 1.25 MG (50000 UNIT) PO CAPS: ORAL_CAPSULE | ORAL | 3 refills | Status: AC

## 2023-04-22 NOTE — Patient Instructions (Signed)
Semaglutide Injection What is this medication? SEMAGLUTIDE (SEM a GLOO tide) treats type 2 diabetes. It works by increasing insulin levels in your body, which decreases your blood sugar (glucose). It also reduces the amount of sugar released into the blood and slows down your digestion. It can also be used to lower the risk of heart attack and stroke in people with type 2 diabetes. Changes to diet and exercise are often combined with this medication. This medicine may be used for other purposes; ask your health care provider or pharmacist if you have questions. COMMON BRAND NAME(S): OZEMPIC What should I tell my care team before I take this medication? They need to know if you have any of these conditions: Endocrine tumors (MEN 2) or if someone in your family had these tumors Eye disease, vision problems History of pancreatitis Kidney disease Stomach problems Thyroid cancer or if someone in your family had thyroid cancer An unusual or allergic reaction to semaglutide, other medications, foods, dyes, or preservatives Pregnant or trying to get pregnant Breast-feeding How should I use this medication? This medication is for injection under the skin of your upper leg (thigh), stomach area, or upper arm. It is given once every week (every 7 days). You will be taught how to prepare and give this medication. Use exactly as directed. Take your medication at regular intervals. Do not take it more often than directed. If you use this medication with insulin, you should inject this medication and the insulin separately. Do not mix them together. Do not give the injections right next to each other. Change (rotate) injection sites with each injection. It is important that you put your used needles and syringes in a special sharps container. Do not put them in a trash can. If you do not have a sharps container, call your pharmacist or care team to get one. A special MedGuide will be given to you by the  pharmacist with each prescription and refill. Be sure to read this information carefully each time. This medication comes with INSTRUCTIONS FOR USE. Ask your pharmacist for directions on how to use this medication. Read the information carefully. Talk to your pharmacist or care team if you have questions. Talk to your care team about the use of this medication in children. Special care may be needed. Overdosage: If you think you have taken too much of this medicine contact a poison control center or emergency room at once. NOTE: This medicine is only for you. Do not share this medicine with others. What if I miss a dose? If you miss a dose, take it as soon as you can within 5 days after the missed dose. Then take your next dose at your regular weekly time. If it has been longer than 5 days after the missed dose, do not take the missed dose. Take the next dose at your regular time. Do not take double or extra doses. If you have questions about a missed dose, contact your care team for advice. What may interact with this medication? Other medications for diabetes Many medications may cause changes in blood sugar, these include: Alcohol containing beverages Antiviral medications for HIV or AIDS Aspirin and aspirin-like medications Certain medications for blood pressure, heart disease, irregular heart beat Chromium Diuretics Female hormones, such as estrogens or progestins, birth control pills Fenofibrate Gemfibrozil Isoniazid Lanreotide Female hormones or anabolic steroids MAOIs like Carbex, Eldepryl, Marplan, Nardil, and Parnate Medications for weight loss Medications for allergies, asthma, cold, or cough Medications for depression,   anxiety, or psychotic disturbances Niacin Nicotine NSAIDs, medications for pain and inflammation, like ibuprofen or naproxen Octreotide Pasireotide Pentamidine Phenytoin Probenecid Quinolone antibiotics such as ciprofloxacin, levofloxacin, ofloxacin Some  herbal dietary supplements Steroid medications such as prednisone or cortisone Sulfamethoxazole; trimethoprim Thyroid hormones Some medications can hide the warning symptoms of low blood sugar (hypoglycemia). You may need to monitor your blood sugar more closely if you are taking one of these medications. These include: Beta-blockers, often used for high blood pressure or heart problems (examples include atenolol, metoprolol, propranolol) Clonidine Guanethidine Reserpine This list may not describe all possible interactions. Give your health care provider a list of all the medicines, herbs, non-prescription drugs, or dietary supplements you use. Also tell them if you smoke, drink alcohol, or use illegal drugs. Some items may interact with your medicine. What should I watch for while using this medication? Visit your care team for regular checks on your progress. Drink plenty of fluids while taking this medication. Check with your care team if you get an attack of severe diarrhea, nausea, and vomiting. The loss of too much body fluid can make it dangerous for you to take this medication. A test called the HbA1C (A1C) will be monitored. This is a simple blood test. It measures your blood sugar control over the last 2 to 3 months. You will receive this test every 3 to 6 months. Learn how to check your blood sugar. Learn the symptoms of low and high blood sugar and how to manage them. Always carry a quick-source of sugar with you in case you have symptoms of low blood sugar. Examples include hard sugar candy or glucose tablets. Make sure others know that you can choke if you eat or drink when you develop serious symptoms of low blood sugar, such as seizures or unconsciousness. They must get medical help at once. Tell your care team if you have high blood sugar. You might need to change the dose of your medication. If you are sick or exercising more than usual, you might need to change the dose of your  medication. Do not skip meals. Ask your care team if you should avoid alcohol. Many nonprescription cough and cold products contain sugar or alcohol. These can affect blood sugar. Pens should never be shared. Even if the needle is changed, sharing may result in passing of viruses like hepatitis or HIV. Wear a medical ID bracelet or chain, and carry a card that describes your disease and details of your medication and dosage times What side effects may I notice from receiving this medication? Side effects that you should report to your care team as soon as possible: Allergic reactions--skin rash, itching, hives, swelling of the face, lips, tongue, or throat Change in vision Dehydration--increased thirst, dry mouth, feeling faint or lightheaded, headache, dark yellow or brown urine Gallbladder problems--severe stomach pain, nausea, vomiting, fever Heart palpitations--rapid, pounding, or irregular heartbeat Kidney injury--decrease in the amount of urine, swelling of the ankles, hands, or feet Pancreatitis--severe stomach pain that spreads to your back or gets worse after eating or when touched, fever, nausea, vomiting Thoughts of suicide or self-harm, worsening mood, feelings of depression Thyroid cancer--new mass or lump in the neck, pain or trouble swallowing, trouble breathing, hoarseness Side effects that usually do not require medical attention (report these to your care team if they continue or are bothersome): Diarrhea Loss of appetite Nausea Upset stomach This list may not describe all possible side effects. Call your doctor for medical advice about   side effects. You may report side effects to FDA at 1-800-FDA-1088. Where should I keep my medication? Keep out of the reach of children. Store unopened pens in a refrigerator between 2 and 8 degrees C (36 and 46 degrees F). Do not freeze. Protect from light and heat. After you first use the pen, it can be stored for 56 days at room  temperature between 15 and 30 degrees C (59 and 86 degrees F) or in a refrigerator. Throw away your used pen after 56 days or after the expiration date, whichever comes first. Do not store your pen with the needle attached. If the needle is left on, medication may leak from the pen. NOTE: This sheet is a summary. It may not cover all possible information. If you have questions about this medicine, talk to your doctor, pharmacist, or health care provider.  2024 Elsevier/Gold Standard (2023-01-18 00:00:00)  

## 2023-04-22 NOTE — Progress Notes (Signed)
Complete Physical  Assessment and Plan:  Sarah Warren was seen today for annual exam.  Diagnoses and all orders for this visit:  Hyperlipidemia associated with type 2 diabetes mellitus (HCC) No longer taking statin - requested to treat via diet. Discussed lifestyle modifications. Recommended diet heavy in fruits and veggies, omega 3's. Decrease consumption of animal meats, cheeses, and dairy products. Remain active and exercise as tolerated. Continue to monitor. Discussed how what you eat and drink can aide in kidney protection. Stay well hydrated. Avoid high salt foods. Avoid NSAIDS. Keep BP and BG well controlled.   Take medications as prescribed. Remain active and exercise as tolerated daily. Maintain weight.  Continue to monitor.   Morbid obesity (HCC)- BMI 35+ with htn, T2DM Discussed appropriate BMI Diet modification. Physical activity. Encouraged/praised to build confidence.  Type 2 diabetes mellitus with hyperlipidemia (HCC) Start Ozempic 0.25 mg/week Education: Reviewed 'ABCs' of diabetes management  Discussed goals to be met and/or maintained include A1C (<7) Blood pressure (<130/80) Cholesterol (LDL <70) Continue Eye Exam yearly  Continue Dental Exam Q6 mo Discussed dietary recommendations Discussed Physical Activity recommendations Check A1C  Benign labile hypertension Controlled Continue losartan, hctz Discussed DASH (Dietary Approaches to Stop Hypertension) DASH diet is lower in sodium than a typical American diet. Cut back on foods that are high in saturated fat, cholesterol, and trans fats. Eat more whole-grain foods, fish, poultry, and nuts Remain active and exercise as tolerated daily.  Monitor BP at home-Call if greater than 130/80.   Left/Righ sided sciatica Continue massage therapy with [facility] Continue to follow with Orthopedics, PT. Continue Gabapentin PRN Continue to monitor  Vitamin D deficiency Continue supplement for  goal of 60-100 Monitor levels  Medication management All medications discussed and reviewed in full. All questions and concerns regarding medications addressed.      Further disposition pending results of labs. Discussed med's effects and SE's.    I discussed the assessment and treatment plan with the patient. The patient was provided an opportunity to ask questions and all were answered. The patient agreed with the plan and demonstrated an understanding of the instructions.  Discussed med's effects and SE's. Screening labs and tests as requested with regular follow-up as recommended.  I provided 20 minutes of face-to-face time during this encounter including counseling, chart review, and critical decision making was preformed.  Future Appointments  Date Time Provider Department Center  08/04/2023  3:00 PM Adela Glimpse, NP [facility] None     HPI  This very nice 52 y.o. AA female presents for general follow up.  She has Morbid obesity (HCC)- BMI 35+ with htn, T2DM; Vitamin D deficiency; B12 deficiency; Medication management; Benign labile hypertension; Type 2 diabetes mellitus with hyperlipidemia (HCC); Hyperlipidemia associated with type 2 diabetes mellitus (HCC); Abnormal mammogram; Iron deficiency anemia; and S/P TAH-BSO (total abdominal hysterectomy and bilateral salpingo-oophorectomy) on their problem list.   She has a son who continues to play college football.  He is trying to decide if he wants to go into PT or fire.   Has recently been worked up and treated for sciatica pain.  She is continuing to follow with Orthopedics.  Had injections that did no help.  She is continuing to follow with PT, stretching.  Has started Gabapentin.  She is going to a massage therapist.  [facility] feels as though this has helped most to relieve the pain.  She is continuing to go monthly    She follows with GYN Dr. Cherly Hensen,  recently had total hysterectomy.  Doing well.  Denies any residual  effects.    Abnormal mammogram 07/2020, right upper outer breast cluster calcification, had repeat that was improved, reports had again in March 06 2021 (see in care everywhere) and was stable. She continues with yearly screenings.  Last completed 08/2022 and normal   BMI is Body mass index is 34.13 kg/m., she has been working on diet and exercise but holidays set her back. Wt Readings from Last 3 Encounters:  04/22/23 227 lb 12.8 oz (103.3 kg)  01/08/23 229 lb 9.6 oz (104.1 kg)  06/05/22 231 lb 9.6 oz (105.1 kg)   She has not been checking BP at home, today their BP is BP: 108/64, similar by manual recheck.   She does workout. She denies chest pain, shortness of breath, dizziness.   She is not on cholesterol medication, strong patient preference to avoid . Her cholesterol is not at goal. The cholesterol last visit was:   Lab Results  Component Value Date   CHOL 195 01/08/2023   HDL 54 01/08/2023   LDLCALC 121 (H) 01/08/2023   TRIG 102 01/08/2023   CHOLHDL 3.6 01/08/2023    She has been working on diet and exercise for T2 diabetes (on jardiance, patient preference to minimize pills, stopped metformin was on jardiance, and stopped d/t increases vaginal yeast, has intermittently.  Switched to Ryblesus, forgets to take at times, stopped and started on Ozempic.Denies foot ulcerations, hyperglycemia, hypoglycemia , increased appetite, nausea, paresthesia of the feet, polydipsia, polyuria, and visual disturbances. She does check fasting glucose, ranges 90-120s. Last A1C in the office was:  Lab Results  Component Value Date   HGBA1C 8.8 (H) 01/08/2023    Last GFR:  Lab Results  Component Value Date   GFRAA 105 01/31/2021   Patient is on Vitamin D supplement.   Lab Results  Component Value Date   VD25OH 96 01/08/2023     She takes B12 supplement regularly Lab Results  Component Value Date   VITAMINB12 471 06/05/2021      Current Medications:  Current Outpatient Medications on  File Prior to Visit  Medication Sig Dispense Refill   Cyanocobalamin (VITAMIN B12 PO) Take by mouth daily. Takes 3-4 tablets twice a week     ferrous sulfate 325 (65 FE) MG tablet Take 325 mg by mouth daily with breakfast.     Latanoprost 0.005 % EMUL Place 0.005 drops into both eyes at bedtime.     losartan-hydrochlorothiazide (HYZAAR) 100-25 MG tablet TAKE 1 TABLET DAILY 90 tablet 3   meloxicam (MOBIC) 15 MG tablet Take one daily with food for 2 weeks, can take with tylenol, can not take with aleve, iburpofen, then as needed daily for pain 30 tablet 1   Multiple Vitamin (MULTIVITAMIN) tablet Take 1 tablet by mouth daily.     Semaglutide,0.25 or 0.5MG /DOS, (OZEMPIC, 0.25 OR 0.5 MG/DOSE,) 2 MG/1.5ML SOPN Inject 0.25 mg into the skin once a week. 2 mL 2   timolol (TIMOPTIC) 0.5 % ophthalmic solution Place 0.5 drops into both eyes 2 (two) times daily.     Vitamin D, Ergocalciferol, (DRISDOL) 1.25 MG (50000 UNIT) CAPS capsule TAKE 1 CAPSULE ONCE A WEEK 13 capsule 3   empagliflozin (JARDIANCE) 25 MG TABS tablet Take 1 tablet (25 mg total) by mouth daily. (Patient not taking: Reported on 04/22/2023) 90 tablet 3   fluconazole (DIFLUCAN) 150 MG tablet Take 1 tablet (150 mg) at the sign of symptoms.  If not resolved after  72 hours may take additional 150 mg dosage. (Patient not taking: Reported on 04/22/2023) 30 tablet 0   rosuvastatin (CRESTOR) 5 MG tablet Take 1 tab three nights a week for cholesterol. (Patient not taking: Reported on 06/05/2022) 39 tablet 3   No current facility-administered medications on file prior to visit.    Medical History:  Past Medical History:  Diagnosis Date   Asthma    childhood   Glaucoma    Hypertension    Uterine fibroid    Uterine leiomyoma 03/23/2018   Following with GYN S/p myomectomy S/p TAH bil salpingooopherectomy 12/31/2021   Allergies No Known Allergies  SURGICAL HISTORY She  has a past surgical history that includes Wisdom tooth extraction; IR Radiologist  Eval & Mgmt (09/11/2021); IR Radiologist Eval & Mgmt (10/16/2021); and Total abdominal hysterectomy w/ bilateral salpingoophorectomy (12/31/2021). FAMILY HISTORY Her family history includes Diabetes in her father, maternal grandmother, and mother; Hypertension in her father and mother. SOCIAL HISTORY She  reports that she has never smoked. She has never used smokeless tobacco. She reports current alcohol use. She reports that she does not use drugs. 4 x a month  Review of Systems: Review of Systems  Constitutional: Negative.  Negative for malaise/fatigue and weight loss.  HENT: Negative.  Negative for hearing loss and tinnitus.   Eyes: Negative.  Negative for blurred vision and double vision.  Respiratory: Negative.  Negative for cough, shortness of breath and wheezing.   Cardiovascular: Negative.  Negative for chest pain, palpitations, orthopnea, claudication and leg swelling.  Gastrointestinal: Negative.  Negative for abdominal pain, blood in stool, constipation, diarrhea, heartburn, melena, nausea and vomiting.  Genitourinary: Negative.   Musculoskeletal: Negative.  Negative for joint pain and myalgias.  Skin:  Negative for itching and rash.  Neurological: Negative.  Negative for dizziness, tingling, sensory change, weakness and headaches.  Endo/Heme/Allergies: Negative.  Negative for polydipsia.  Psychiatric/Behavioral: Negative.    All other systems reviewed and are negative.   Physical Exam: Estimated body mass index is 34.13 kg/m as calculated from the following:   Height as of this encounter: 5' 8.5" (1.74 m).   Weight as of this encounter: 227 lb 12.8 oz (103.3 kg). BP 108/64   Pulse 64   Temp 97.6 F (36.4 C)   Ht 5' 8.5" (1.74 m)   Wt 227 lb 12.8 oz (103.3 kg)   SpO2 99%   BMI 34.13 kg/m  General Appearance: Well nourished, in no apparent distress.  Eyes: PERRLA, EOMs, conjunctiva no swelling or erythema, normal fundi and vessels.  Sinuses: No Frontal/maxillary  tenderness  ENT/Mouth: Ext aud canals clear, normal light reflex with TMs without erythema, bulging. Good dentition. No erythema, swelling, or exudate on post pharynx. Tonsils not swollen or erythematous. Hearing normal.  Neck: Supple, thyroid normal. No bruits  Respiratory: Respiratory effort normal, BS equal bilaterally without rales, rhonchi, wheezing or stridor.  Cardio: RRR without murmurs, rubs or gallops. Brisk peripheral pulses without edema.  Chest: symmetric, with normal excursions and percussion.  Breasts: defer to GYN Abdomen: Soft, nontender, + fixed enlarged uterus, nontender, rebound, hernias, masses, or organomegaly.  Lymphatics: Non tender without lymphadenopathy.  Genitourinary: defer to GYN Musculoskeletal: Full ROM all peripheral extremities,5/5 strength, and normal gait.  Skin: Warm, dry without rashes, lesions, ecchymosis. Neuro: Cranial nerves intact, reflexes equal bilaterally. Normal muscle tone, no cerebellar symptoms. Sensation intact.  Psych: Awake and oriented X 3, normal affect, Insight and Judgment appropriate.   Adela Glimpse, NP 4:19 PM [facility]

## 2023-04-23 LAB — LIPID PANEL
Cholesterol: 211 mg/dL — ABNORMAL HIGH (ref <200)
HDL: 51 mg/dL (ref >=50)
LDL Cholesterol (Calc): 135 mg/dL (calc) — ABNORMAL HIGH
Non-HDL Cholesterol (Calc): 160 mg/dL (calc) — ABNORMAL HIGH (ref <130)
Total CHOL/HDL Ratio: 4.1 (calc) (ref <5.0)
Triglycerides: 123 mg/dL (ref <150)

## 2023-04-23 LAB — COMPLETE METABOLIC PANEL WITH GFR
AG Ratio: 1.1 (calc) (ref 1.0–2.5)
ALT: 20 U/L (ref 6–29)
AST: 17 U/L (ref 10–35)
Albumin: 3.9 g/dL (ref 3.6–5.1)
Alkaline phosphatase (APISO): 85 U/L (ref 37–153)
BUN: 14 mg/dL (ref 7–25)
CO2: 31 mmol/L (ref 20–32)
Calcium: 9.8 mg/dL (ref 8.6–10.4)
Chloride: 102 mmol/L (ref 98–110)
Creat: 0.81 mg/dL (ref 0.50–1.03)
Globulin: 3.6 g/dL (calc) (ref 1.9–3.7)
Glucose, Bld: 151 mg/dL — ABNORMAL HIGH (ref 65–139)
Potassium: 4 mmol/L (ref 3.5–5.3)
Sodium: 139 mmol/L (ref 135–146)
Total Bilirubin: 0.4 mg/dL (ref 0.2–1.2)
Total Protein: 7.5 g/dL (ref 6.1–8.1)
eGFR: 87 mL/min/{1.73_m2} (ref >=60)

## 2023-04-23 LAB — CBC WITH DIFFERENTIAL/PLATELET
Absolute Monocytes: 616 cells/uL (ref 200–950)
Basophils Absolute: 49 cells/uL (ref 0–200)
Basophils Relative: 0.6 %
Eosinophils Absolute: 138 cells/uL (ref 15–500)
Eosinophils Relative: 1.7 %
HCT: 43.3 % (ref 35.0–45.0)
Hemoglobin: 14.5 g/dL (ref 11.7–15.5)
Lymphs Abs: 3362 cells/uL (ref 850–3900)
MCH: 27.1 pg (ref 27.0–33.0)
MCHC: 33.5 g/dL (ref 32.0–36.0)
MCV: 80.9 fL (ref 80.0–100.0)
MPV: 12.2 fL (ref 7.5–12.5)
Monocytes Relative: 7.6 %
Neutro Abs: 3937 cells/uL (ref 1500–7800)
Neutrophils Relative %: 48.6 %
Platelets: 270 10*3/uL (ref 140–400)
RBC: 5.35 10*6/uL — ABNORMAL HIGH (ref 3.80–5.10)
RDW: 13.3 % (ref 11.0–15.0)
Total Lymphocyte: 41.5 %
WBC: 8.1 10*3/uL (ref 3.8–10.8)

## 2023-04-23 LAB — HEMOGLOBIN A1C
Hgb A1c MFr Bld: 8 % of total Hgb — ABNORMAL HIGH (ref <5.7)
Mean Plasma Glucose: 183 mg/dL
eAG (mmol/L): 10.1 mmol/L

## 2023-04-23 LAB — VITAMIN D 25 HYDROXY (VIT D DEFICIENCY, FRACTURES): Vit D, 25-Hydroxy: 74 ng/mL (ref 30–100)

## 2023-05-19 ENCOUNTER — Encounter: Payer: Self-pay | Admitting: Nurse Practitioner

## 2023-05-19 MED ORDER — SEMAGLUTIDE (1 MG/DOSE) 4 MG/3ML ~~LOC~~ SOPN: 1.0000 mg | PEN_INJECTOR | SUBCUTANEOUS | 1 refills | Status: DC

## 2023-06-08 ENCOUNTER — Encounter: Payer: 59 | Admitting: Nurse Practitioner

## 2023-06-23 ENCOUNTER — Encounter: Payer: Self-pay | Admitting: Nurse Practitioner

## 2023-06-23 MED ORDER — OZEMPIC (2 MG/DOSE) 8 MG/3ML ~~LOC~~ SOPN: 2.0000 mg | PEN_INJECTOR | SUBCUTANEOUS | 2 refills | Status: DC

## 2023-08-04 ENCOUNTER — Ambulatory Visit (INDEPENDENT_AMBULATORY_CARE_PROVIDER_SITE_OTHER): Payer: 59 | Admitting: Nurse Practitioner

## 2023-08-04 ENCOUNTER — Encounter: Payer: Self-pay | Admitting: Nurse Practitioner

## 2023-08-04 VITALS — BP 108/62 | HR 61 | Temp 97.6°F | Ht 69.0 in | Wt 226.2 lb

## 2023-08-04 DIAGNOSIS — Z6834 Body mass index (BMI) 34.0-34.9, adult: Secondary | ICD-10-CM

## 2023-08-04 DIAGNOSIS — Z79899 Other long term (current) drug therapy: Secondary | ICD-10-CM

## 2023-08-04 DIAGNOSIS — M5431 Sciatica, right side: Secondary | ICD-10-CM

## 2023-08-04 DIAGNOSIS — M5432 Sciatica, left side: Secondary | ICD-10-CM

## 2023-08-04 DIAGNOSIS — Z0000 Encounter for general adult medical examination without abnormal findings: Secondary | ICD-10-CM | POA: Diagnosis not present

## 2023-08-04 DIAGNOSIS — R928 Other abnormal and inconclusive findings on diagnostic imaging of breast: Secondary | ICD-10-CM

## 2023-08-04 DIAGNOSIS — Z1389 Encounter for screening for other disorder: Secondary | ICD-10-CM

## 2023-08-04 DIAGNOSIS — E559 Vitamin D deficiency, unspecified: Secondary | ICD-10-CM

## 2023-08-04 DIAGNOSIS — E1169 Type 2 diabetes mellitus with other specified complication: Secondary | ICD-10-CM

## 2023-08-04 DIAGNOSIS — Z136 Encounter for screening for cardiovascular disorders: Secondary | ICD-10-CM

## 2023-08-04 DIAGNOSIS — I10 Essential (primary) hypertension: Secondary | ICD-10-CM

## 2023-08-04 DIAGNOSIS — E785 Hyperlipidemia, unspecified: Secondary | ICD-10-CM

## 2023-08-04 DIAGNOSIS — Z0001 Encounter for general adult medical examination with abnormal findings: Secondary | ICD-10-CM

## 2023-08-04 MED ORDER — TIRZEPATIDE 2.5 MG/0.5ML ~~LOC~~ SOAJ
2.5000 mg | SUBCUTANEOUS | 2 refills | Status: DC
Start: 2023-08-04 — End: 2023-09-22

## 2023-08-04 NOTE — Progress Notes (Signed)
Complete Physical  Assessment and Plan:  Sarah Warren was seen today for annual exam.  Diagnoses and all orders for this visit:  Encounter for general adult medical examination with abnormal findings Due annually  Health maintenance reviewed Healthily lifestyle goals set  Hyperlipidemia associated with type 2 diabetes mellitus (HCC) Discussed lifestyle modifications. Recommended diet heavy in fruits and veggies, omega 3's. Decrease consumption of animal meats, cheeses, and dairy products. Remain active and exercise as tolerated. Continue to monitor. Discussed how what you eat and drink can aide in kidney protection. Stay well hydrated. Avoid high salt foods. Avoid NSAIDS. Keep BP and BG well controlled.   Take medications as prescribed. Remain active and exercise as tolerated daily. Maintain weight.  Continue to monitor.  Morbid obesity (HCC)- BMI 35+ with htn, T2DM Discussed appropriate BMI Diet modification. Physical activity. Encouraged/praised to build confidence.  Type 2 diabetes mellitus with hyperlipidemia (HCC) Stop Ozempic Start Mounjaro for tighter A1c control.   Education: Reviewed 'ABCs' of diabetes management  A1C (<7) Blood pressure (<130/80) Cholesterol (LDL <70) Continue Eye Exam yearly  Continue Dental Exam Q6 mo Discussed dietary recommendations Discussed Physical Activity recommendations  Obesity/BMI 34.0 Discussed appropriate BMI Diet modification. Physical activity. Encouraged/praised to build confidence.  Benign labile hypertension Discussed DASH (Dietary Approaches to Stop Hypertension) DASH diet is lower in sodium than a typical American diet. Cut back on foods that are high in saturated fat, cholesterol, and trans fats. Eat more whole-grain foods, fish, poultry, and nuts Remain active and exercise as tolerated daily.  Monitor BP at home-Call if greater than 130/80.   Left/Right sided sciatica Continue to follow with Orthopedics,  PT. Rest when flared. Continue Gabapentin. Continue to monitor  Vitamin D deficiency Continue supplement for goal of 60-100 Monitor Vitamin D levels  Abnormal mammogram Continue yearly screening via GYN  Screening for hematuria or proteinuria - Urinalysis, Routine w reflex microscopic  Screening for cardiovascular condition - EKG 12-Lead  Medication management All medications discussed and reviewed in full. All questions and concerns regarding medications addressed.    Orders Placed This Encounter  Procedures   CBC with Differential/Platelet   COMPLETE METABOLIC PANEL WITH GFR   Lipid panel   TSH   Hemoglobin A1c   Insulin, random   VITAMIN D 25 Hydroxy (Vit-D Deficiency, Fractures)   Urinalysis, Routine w reflex microscopic   Microalbumin / creatinine urine ratio   EKG 12-Lead   Meds ordered this encounter  Medications   tirzepatide (MOUNJARO) 2.5 MG/0.5ML Pen    Sig: Inject 2.5 mg into the skin once a week.    Dispense:  2 mL    Refill:  2    Order Specific Question:   Supervising Provider    Answer:   Lucky Cowboy [phone]   Notify office for further evaluation and treatment, questions or concerns if any reported s/s fail to improve.   The patient was advised to call back or seek an in-person evaluation if any symptoms worsen or if the condition fails to improve as anticipated.   Further disposition pending results of labs. Discussed med's effects and SE's.    I discussed the assessment and treatment plan with the patient. The patient was provided an opportunity to ask questions and all were answered. The patient agreed with the plan and demonstrated an understanding of the instructions.  Discussed med's effects and SE's. Screening labs and tests as requested with regular follow-up as recommended.  I provided 40 minutes of face-to-face time during this encounter including  counseling, chart review, and critical decision making was preformed.  Today's Plan  of Care is based on a patient-centered health care approach known as shared decision making - the decisions, tests and treatments allow for patient preferences and values to be balanced with clinical evidence.     Future Appointments  Date Time Provider Department Center  08/03/2024  3:00 PM Adela Glimpse, NP [facility] None     HPI  This very nice 52 y.o. AA female presents for complete physical.  She has Morbid obesity (HCC)- BMI 35+ with htn, T2DM; Vitamin D deficiency; B12 deficiency; Medication management; Benign labile hypertension; Type 2 diabetes mellitus with hyperlipidemia (HCC); Hyperlipidemia associated with type 2 diabetes mellitus (HCC); Abnormal mammogram; Iron deficiency anemia; and S/P TAH-BSO (total abdominal hysterectomy and bilateral salpingo-oophorectomy) on their problem list.  Overall she reports feeling well today.  She has no new concerns at this time.  Has recently been worked up and treated for sciatica pain.  She is continuing to follow with Orthopedics.  Had injections that did no help.  She is continuing to follow with PT, stretching.  Has started Gabapentin.  She follows with GYN Dr. Cherly Hensen, recently had total hysterectomy.  Doing well.   Abnormal mammogram 07/2020, right upper outer breast cluster calcification, had repeat that was improved, reports had again in March 06 2021 (see in care everywhere) and was stable. She continues with yearly screenings.  BMI is Body mass index is 33.4 kg/m., she has been working on diet and exercise. Continues to exercise 3 days a week.   Wt Readings from Last 3 Encounters:  08/04/23 226 lb 3.2 oz (102.6 kg)  04/22/23 227 lb 12.8 oz (103.3 kg)  01/08/23 229 lb 9.6 oz (104.1 kg)   She has not been checking BP at home, today their BP is BP: 108/62, similar by manual recheck.   She does workout. She denies chest pain, shortness of breath, dizziness.   She is not on cholesterol medication, strong patient preference to avoid  . Her cholesterol is not at goal. The cholesterol last visit was:   Lab Results  Component Value Date   CHOL 211 (H) 04/22/2023   HDL 51 04/22/2023   LDLCALC 135 (H) 04/22/2023   TRIG 123 04/22/2023   CHOLHDL 4.1 04/22/2023    She has been working on diet and exercise for T2 diabetes -has been on Ozempic 2 mg/week and A1c continues to remain elevated.  and denies foot ulcerations, hyperglycemia, hypoglycemia , increased appetite, nausea, paresthesia of the feet, polydipsia, polyuria, and visual disturbances. She does check fasting glucose, ranges 90-120s. Last A1C in the office was:  Lab Results  Component Value Date   HGBA1C 8.0 (H) 04/22/2023    Last GFR:  Lab Results  Component Value Date   GFRAA 105 01/31/2021   Patient is on Vitamin D supplement.   Lab Results  Component Value Date   VD25OH 74 04/22/2023     She takes B12 supplement regularly Lab Results  Component Value Date   VITAMINB12 471 06/05/2021      Current Medications:  Current Outpatient Medications on File Prior to Visit  Medication Sig Dispense Refill   Cyanocobalamin (VITAMIN B12 PO) Take by mouth daily.     empagliflozin (JARDIANCE) 25 MG TABS tablet Take 1 tablet (25 mg total) by mouth daily. 90 tablet 3   ferrous sulfate 325 (65 FE) MG tablet Take 325 mg by mouth daily with breakfast.     Latanoprost 0.005 %  EMUL Place 0.005 drops into both eyes at bedtime.     losartan-hydrochlorothiazide (HYZAAR) 100-25 MG tablet TAKE 1 TABLET DAILY 90 tablet 3   Multiple Vitamin (MULTIVITAMIN) tablet Take 1 tablet by mouth daily.     timolol (TIMOPTIC) 0.5 % ophthalmic solution Place 0.5 drops into both eyes 2 (two) times daily.     Vitamin D, Ergocalciferol, (DRISDOL) 1.25 MG (50000 UNIT) CAPS capsule TAKE 1 CAPSULE ONCE A WEEK 13 capsule 3   fluconazole (DIFLUCAN) 150 MG tablet Take 1 tablet (150 mg) at the sign of symptoms.  If not resolved after 72 hours may take additional 150 mg dosage. 30 tablet 0    meloxicam (MOBIC) 15 MG tablet Take one daily with food for 2 weeks, can take with tylenol, can not take with aleve, iburpofen, then as needed daily for pain (Patient not taking: Reported on 08/04/2023) 30 tablet 1   rosuvastatin (CRESTOR) 5 MG tablet Take 1 tab three nights a week for cholesterol. (Patient not taking: Reported on 06/05/2022) 39 tablet 3   No current facility-administered medications on file prior to visit.   Health Maintenance:   Immunization History  Administered Date(s) Administered   Influenza Inj Mdck Quad With Preservative 09/30/2019, 10/16/2020   Influenza,inj,Quad PF,6+ Mos 09/10/2021   Influenza-Unspecified 09/13/2015   PFIZER(Purple Top)SARS-COV-2 Vaccination 02/18/2020, 03/13/2020   PPD Test 04/25/2014   Tdap 04/25/2014, 09/13/2015     TD/TDAP: 2015 Influenza: 2023 PPD 2015 Pneumovax: declines at this time Prevnar 13: N/A Shingrix; check with insurance Covid 19: 2/2, 2021  LMP: Total hysterectomy 12/31/2021 MGM: 02/2022  Colonoscopy: 06/2020 no polyps, Dr. Myrtie Neither, was recommended 5 year recall,  Due to inadequate prep   Last Dental Exam:  q 6 months  Last Eye Exam: Dr. Richelle Ito My eye doctor, 2023, glasses Dr. Wynelle Link retinal specialist for glaucoma q10m  Medical History:  Past Medical History:  Diagnosis Date   Asthma    childhood   Glaucoma    Hypertension    Uterine fibroid    Uterine leiomyoma 03/23/2018   Following with GYN S/p myomectomy S/p TAH bil salpingooopherectomy 12/31/2021   Allergies No Known Allergies  SURGICAL HISTORY She  has a past surgical history that includes Wisdom tooth extraction; IR Radiologist Eval & Mgmt (09/11/2021); IR Radiologist Eval & Mgmt (10/16/2021); and Total abdominal hysterectomy w/ bilateral salpingoophorectomy (12/31/2021). FAMILY HISTORY Her family history includes Diabetes in her father, maternal grandmother, and mother; Hypertension in her father and mother. SOCIAL HISTORY She  reports that she has never  smoked. She has never used smokeless tobacco. She reports current alcohol use. She reports that she does not use drugs. 4 x a month  Review of Systems: Review of Systems  Constitutional: Negative.  Negative for malaise/fatigue and weight loss.  HENT: Negative.  Negative for hearing loss and tinnitus.   Eyes: Negative.  Negative for blurred vision and double vision.  Respiratory: Negative.  Negative for cough, shortness of breath and wheezing.   Cardiovascular: Negative.  Negative for chest pain, palpitations, orthopnea, claudication and leg swelling.  Gastrointestinal: Negative.  Negative for abdominal pain, blood in stool, constipation, diarrhea, heartburn, melena, nausea and vomiting.  Genitourinary: Negative.   Musculoskeletal: Negative.  Negative for joint pain and myalgias.  Skin:  Negative for itching and rash.  Neurological: Negative.  Negative for dizziness, tingling, sensory change, weakness and headaches.  Endo/Heme/Allergies: Negative.  Negative for polydipsia.  Psychiatric/Behavioral: Negative.    All other systems reviewed and are negative.   Physical  Exam: Estimated body mass index is 33.4 kg/m as calculated from the following:   Height as of this encounter: 5\' 9"  (1.753 m).   Weight as of this encounter: 226 lb 3.2 oz (102.6 kg). BP 108/62   Pulse 61   Temp 97.6 F (36.4 C)   Ht 5\' 9"  (1.753 m)   Wt 226 lb 3.2 oz (102.6 kg)   SpO2 99%   BMI 33.40 kg/m  General Appearance: Well nourished, in no apparent distress.  Eyes: PERRLA, EOMs, conjunctiva no swelling or erythema, normal fundi and vessels.  Sinuses: No Frontal/maxillary tenderness  ENT/Mouth: Ext aud canals clear, normal light reflex with TMs without erythema, bulging. Good dentition. No erythema, swelling, or exudate on post pharynx. Tonsils not swollen or erythematous. Hearing normal.  Neck: Supple, thyroid normal. No bruits  Respiratory: Respiratory effort normal, BS equal bilaterally without rales,  rhonchi, wheezing or stridor.  Cardio: RRR without murmurs, rubs or gallops. Brisk peripheral pulses without edema.  Chest: symmetric, with normal excursions and percussion.  Breasts: defer to GYN Abdomen: Soft, nontender, + fixed enlarged uterus, nontender, rebound, hernias, masses, or organomegaly.  Lymphatics: Non tender without lymphadenopathy.  Genitourinary: defer to GYN Musculoskeletal: Full ROM all peripheral extremities,5/5 strength, and normal gait.  Skin: Warm, dry without rashes, lesions, ecchymosis. Neuro: Cranial nerves intact, reflexes equal bilaterally. Normal muscle tone, no cerebellar symptoms. Sensation intact.  Psych: Awake and oriented X 3, normal affect, Insight and Judgment appropriate.   EKG: Sinus brady, no ST changes  Adela Glimpse, NP 3:54 PM [facility]

## 2023-08-04 NOTE — Patient Instructions (Signed)
Tirzepatide Injection What is this medication? TIRZEPATIDE (tir ZEP a tide) treats type 2 diabetes. It works by increasing insulin levels in your body, which decreases your blood sugar (glucose). It also reduces the amount of sugar released into your blood and slows down your digestion. Changes to diet and exercise are often combined with this medication. This medicine may be used for other purposes; ask your health care provider or pharmacist if you have questions. COMMON BRAND NAME(S): MOUNJARO What should I tell my care team before I take this medication? They need to know if you have any of these conditions: Endocrine tumors (MEN 2) or if someone in your family had these tumors Eye disease, vision problems Gallbladder disease History of pancreatitis Kidney disease Stomach or intestine problems Thyroid cancer or if someone in your family had thyroid cancer An unusual or allergic reaction to tirzepatide, other medications, foods, dyes, or preservatives Pregnant or trying to get pregnant Breast-feeding How should I use this medication? This medication is injected under the skin. You will be taught how to prepare and give it. It is given once every week (every 7 days). Keep taking it unless your health care provider tells you to stop. If you use this medication with insulin, you should inject this medication and the insulin separately. Do not mix them together. Do not give the injections right next to each other. Change (rotate) injection sites with each injection. This medication comes with INSTRUCTIONS FOR USE. Ask your pharmacist for directions on how to use this medication. Read the information carefully. Talk to your pharmacist or care team if you have questions. It is important that you put your used needles and syringes in a special sharps container. Do not put them in a trash can. If you do not have a sharps container, call your pharmacist or care team to get one. A special MedGuide  will be given to you by the pharmacist with each prescription and refill. Be sure to read this information carefully each time. Talk to your care team about the use of this medication in children. Special care may be needed. Overdosage: If you think you have taken too much of this medicine contact a poison control center or emergency room at once. NOTE: This medicine is only for you. Do not share this medicine with others. What if I miss a dose? If you miss a dose, take it as soon as you can unless it is more than 4 days (96 hours) late. If it is more than 4 days late, skip the missed dose. Take the next dose at the normal time. Do not take 2 doses within 3 days of each other. What may interact with this medication? Alcohol Antiviral medications for HIV or AIDS Aspirin and aspirin-like medications Beta blockers, such as atenolol, metoprolol, propranolol Certain medications for blood pressure, heart disease, irregular heart beat Chromium Clonidine Diuretics Estrogen or progestin hormones Fenofibrate Gemfibrozil Guanethidine Isoniazid Lanreotide Female hormones or anabolic steroids MAOIs, such as Marplan, Nardil, and Parnate Medications for weight loss Medications for allergies, asthma, cold, or cough Medications for depression, anxiety, or mental health conditions Niacin Nicotine NSAIDs, medications for pain and inflammation, such as ibuprofen or naproxen Octreotide Other medications for diabetes, such as glyburide, glipizide, or glimepiride Pasireotide Pentamidine Phenytoin Probenecid Quinolone antibiotics, such as ciprofloxacin, levofloxacin, ofloxacin Reserpine Some herbal dietary supplements Steroid medications, such as prednisone or cortisone Sulfamethoxazole; trimethoprim Thyroid hormones Warfarin This list may not describe all possible interactions. Give your health care provider a  list of all the medicines, herbs, non-prescription drugs, or dietary supplements you use.  Also tell them if you smoke, drink alcohol, or use illegal drugs. Some items may interact with your medicine. What should I watch for while using this medication? Visit your care team for regular checks on your progress. You may need blood work done while you are taking this medication. Your care team will monitor your HbA1C (A1C). This test shows what your average blood sugar (glucose) level was over the past 2 to 3 months. Know the symptoms of low blood sugar and know how to treat it. Always carry a source of quick sugar with you. Examples include hard sugar candy or glucose tablets. Make sure others know that you can choke if you eat or drink if your blood sugar is too low and you are unable to care for yourself. Get medical help at once. Tell your care team if you have high blood sugar. Your medication dose may change if your body is under stress. Some types of stress that may affect your blood sugar include fever, infection, and surgery. Check with your care team if you have severe diarrhea, nausea, and vomiting, or if you sweat a lot. The loss of too much body fluid may make it dangerous for you to take this medication. Do not share pens or cartridges with anyone, even if the needle is changed. Each pen should only be used by one person. Sharing could cause an infection. Wear a medical ID bracelet or chain. Carry a card that describes your condition. List the medications and doses you take on the card. Talk to your care team about your risk of cancer. You may be more at risk for certain types of cancer if you take this medication. Estrogen and progestin hormones may not work as well while you are taking this medication. If you take these as pills by mouth, your care team may recommend another type of contraception for 4 weeks after you start this medication and for 4 weeks after each dose increase. Talk to your care team about contraceptive options. They can help you find the option that works for  you. What side effects may I notice from receiving this medication? Side effects that you should report to your care team as soon as possible: Allergic reactions or angioedema--skin rash, itching or hives, swelling of the face, eyes, lips, tongue, arms, or legs, trouble swallowing or breathing Change in vision Dehydration--increased thirst, dry mouth, feeling faint or lightheaded, headache, dark yellow or brown urine Gallbladder problems--severe stomach pain, nausea, vomiting, fever Kidney injury--decrease in the amount of urine, swelling of the ankles, hands, or feet Pancreatitis--severe stomach pain that spreads to your back or gets worse after eating or when touched, fever, nausea, vomiting Thoughts of suicide or self-harm, worsening mood, feelings of depression Thyroid cancer--new mass or lump in the neck, pain or trouble swallowing, trouble breathing, hoarseness Side effects that usually do not require medical attention (report these to your care team if they continue or are bothersome): Diarrhea Loss of appetite Nausea Upset stomach This list may not describe all possible side effects. Call your doctor for medical advice about side effects. You may report side effects to FDA at 1-800-FDA-1088. Where should I keep my medication? Keep out of the reach of children and pets. Refrigeration (preferred): Store in the refrigerator. Keep this medication in the original carton until you are ready to take it. Do not freeze. Protect from light. Get rid of opened vials  after use, even if there is medication left. Get rid of any unopened vials or pens after the expiration date. Room Temperature: This medication may be stored at room temperature below 30 degrees C (86 degrees F) for up to 21 days. Keep this medication in the original carton until you are ready to take it. Protect from light. Avoid exposure to extreme heat. Get rid of opened vials after use, even if there is medication left. Get rid of any  unopened vials or pens after 21 days, or after they expire, whichever is first. To get rid of medications that are no longer needed or have expired: Take the medication to a medication take-back program. Check with your pharmacy or law enforcement to find a location. If you cannot return the medication, ask your pharmacist or care team how to get rid of this medication safely. NOTE: This sheet is a summary. It may not cover all possible information. If you have questions about this medicine, talk to your doctor, pharmacist, or health care provider.  2024 Elsevier/Gold Standard (2023-03-05 00:00:00)   Healthy Eating, Adult Healthy eating may help you get and keep a healthy body weight, reduce the risk of chronic disease, and live a long and productive life. It is important to follow a healthy eating pattern. Your nutritional and calorie needs should be met mainly by different nutrient-rich foods. What are tips for following this plan? Reading food labels Read labels and choose the following: Reduced or low sodium products. Juices with 100% fruit juice. Foods with low saturated fats (<3 g per serving) and high polyunsaturated and monounsaturated fats. Foods with whole grains, such as whole wheat, cracked wheat, brown rice, and wild rice. Whole grains that are fortified with folic acid. This is recommended for females who are pregnant or who want to become pregnant. Read labels and do not eat or drink the following: Foods or drinks with added sugars. These include foods that contain brown sugar, corn sweetener, corn syrup, dextrose, fructose, glucose, high-fructose corn syrup, honey, invert sugar, lactose, malt syrup, maltose, molasses, raw sugar, sucrose, trehalose, or turbinado sugar. Limit your intake of added sugars to less than 10% of your total daily calories. Do not eat more than the following amounts of added sugar per day: 6 teaspoons (25 g) for females. 9 teaspoons (38 g) for  males. Foods that contain processed or refined starches and grains. Refined grain products, such as white flour, degermed cornmeal, white bread, and white rice. Shopping Choose nutrient-rich snacks, such as vegetables, whole fruits, and nuts. Avoid high-calorie and high-sugar snacks, such as potato chips, fruit snacks, and candy. Use oil-based dressings and spreads on foods instead of solid fats such as butter, margarine, sour cream, or cream cheese. Limit pre-made sauces, mixes, and "instant" products such as flavored rice, instant noodles, and ready-made pasta. Try more plant-protein sources, such as tofu, tempeh, black beans, edamame, lentils, nuts, and seeds. Explore eating plans such as the Mediterranean diet or vegetarian diet. Try heart-healthy dips made with beans and healthy fats like hummus and guacamole. Vegetables go great with these. Cooking Use oil to saut or stir-fry foods instead of solid fats such as butter, margarine, or lard. Try baking, boiling, grilling, or broiling instead of frying. Remove the fatty part of meats before cooking. Steam vegetables in water or broth. Meal planning  At meals, imagine dividing your plate into fourths: One-half of your plate is fruits and vegetables. One-fourth of your plate is whole grains. One-fourth of your plate  is protein, especially lean meats, poultry, eggs, tofu, beans, or nuts. Include low-fat dairy as part of your daily diet. Lifestyle Choose healthy options in all settings, including home, work, school, restaurants, or stores. Prepare your food safely: Wash your hands after handling raw meats. Where you prepare food, keep surfaces clean by regularly washing with hot, soapy water. Keep raw meats separate from ready-to-eat foods, such as fruits and vegetables. Cook seafood, meat, poultry, and eggs to the recommended temperature. Get a food thermometer. Store foods at safe temperatures. In general: Keep cold foods at 55F  (4.4C) or below. Keep hot foods at 155F (60C) or above. Keep your freezer at [facility] (-17.8C) or below. Foods are not safe to eat if they have been between the temperatures of 40-155F (4.4-60C) for more than 2 hours. What foods should I eat? Fruits Aim to eat 1-2 cups of fresh, canned (in natural juice), or frozen fruits each day. One cup of fruit equals 1 small apple, 1 large banana, 8 large strawberries, 1 cup (237 g) canned fruit,  cup (82 g) dried fruit, or 1 cup (240 mL) 100% juice. Vegetables Aim to eat 2-4 cups of fresh and frozen vegetables each day, including different varieties and colors. One cup of vegetables equals 1 cup (91 g) broccoli or cauliflower florets, 2 medium carrots, 2 cups (150 g) raw, leafy greens, 1 large tomato, 1 large bell pepper, 1 large sweet potato, or 1 medium white potato. Grains Aim to eat 5-10 ounce-equivalents of whole grains each day. Examples of 1 ounce-equivalent of grains include 1 slice of bread, 1 cup (40 g) ready-to-eat cereal, 3 cups (24 g) popcorn, or  cup (93 g) cooked rice. Meats and other proteins Try to eat 5-7 ounce-equivalents of protein each day. Examples of 1 ounce-equivalent of protein include 1 egg,  oz nuts (12 almonds, 24 pistachios, or 7 walnut halves), 1/4 cup (90 g) cooked beans, 6 tablespoons (90 g) hummus or 1 tablespoon (16 g) peanut butter. A cut of meat or fish that is the size of a deck of cards is about 3-4 ounce-equivalents (85 g). Of the protein you eat each week, try to have at least 8 sounce (227 g) of seafood. This is about 2 servings per week. This includes salmon, trout, herring, sardines, and anchovies. Dairy Aim to eat 3 cup-equivalents of fat-free or low-fat dairy each day. Examples of 1 cup-equivalent of dairy include 1 cup (240 mL) milk, 8 ounces (250 g) yogurt, 1 ounces (44 g) natural cheese, or 1 cup (240 mL) fortified soy milk. Fats and oils Aim for about 5 teaspoons (21 g) of fats and oils per day. Choose  monounsaturated fats, such as canola and olive oils, mayonnaise made with olive oil or avocado oil, avocados, peanut butter, and most nuts, or polyunsaturated fats, such as sunflower, corn, and soybean oils, walnuts, pine nuts, sesame seeds, sunflower seeds, and flaxseed. Beverages Aim for 6 eight-ounce glasses of water per day. Limit coffee to 3-5 eight-ounce cups per day. Limit caffeinated beverages that have added calories, such as soda and energy drinks. If you drink alcohol: Limit how much you have to: 0-1 drink a day if you are female. 0-2 drinks a day if you are female. Know how much alcohol is in your drink. In the [place] one drink is one 12 oz bottle of beer (355 mL), one 5 oz glass of wine (148 mL), or one 1 oz glass of hard liquor (44 mL). Seasoning and other foods Try  not to add too much salt to your food. Try using herbs and spices instead of salt. Try not to add sugar to food. This information is based on [place] nutrition guidelines. To learn more, visit [employer]. Exact amounts may vary. You may need different amounts. This information is not intended to replace advice given to you by your health care provider. Make sure you discuss any questions you have with your health care provider. Document Revised: 08/11/2022 Document Reviewed: 08/11/2022 Elsevier Patient Education  2024 [employer].

## 2023-08-05 LAB — CBC WITH DIFFERENTIAL/PLATELET
Absolute Monocytes: 558 {cells}/uL (ref 200–950)
Basophils Absolute: 41 {cells}/uL (ref 0–200)
Basophils Relative: 0.5 %
Eosinophils Absolute: 107 {cells}/uL (ref 15–500)
Eosinophils Relative: 1.3 %
HCT: 44.9 % (ref 35.0–45.0)
Hemoglobin: 14.8 g/dL (ref 11.7–15.5)
Lymphs Abs: 3198 {cells}/uL (ref 850–3900)
MCH: 27.3 pg (ref 27.0–33.0)
MCHC: 33 g/dL (ref 32.0–36.0)
MCV: 82.8 fL (ref 80.0–100.0)
MPV: 11.5 fL (ref 7.5–12.5)
Monocytes Relative: 6.8 %
Neutro Abs: 4297 {cells}/uL (ref 1500–7800)
Neutrophils Relative %: 52.4 %
Platelets: 282 10*3/uL (ref 140–400)
RBC: 5.42 10*6/uL — ABNORMAL HIGH (ref 3.80–5.10)
RDW: 13.2 % (ref 11.0–15.0)
Total Lymphocyte: 39 %
WBC: 8.2 10*3/uL (ref 3.8–10.8)

## 2023-08-05 LAB — URINALYSIS, ROUTINE W REFLEX MICROSCOPIC
Bilirubin Urine: NEGATIVE
Glucose, UA: NEGATIVE
Hgb urine dipstick: NEGATIVE
Ketones, ur: NEGATIVE
Leukocytes,Ua: NEGATIVE
Nitrite: NEGATIVE
Protein, ur: NEGATIVE
Specific Gravity, Urine: 1.019 (ref 1.001–1.035)
pH: 5.5 (ref 5.0–8.0)

## 2023-08-05 LAB — COMPLETE METABOLIC PANEL WITH GFR
AG Ratio: 1.1 (calc) (ref 1.0–2.5)
ALT: 14 U/L (ref 6–29)
AST: 14 U/L (ref 10–35)
Albumin: 4.2 g/dL (ref 3.6–5.1)
Alkaline phosphatase (APISO): 102 U/L (ref 37–153)
BUN: 16 mg/dL (ref 7–25)
CO2: 28 mmol/L (ref 20–32)
Calcium: 10 mg/dL (ref 8.6–10.4)
Chloride: 100 mmol/L (ref 98–110)
Creat: 0.82 mg/dL (ref 0.50–1.03)
Globulin: 3.9 g/dL — ABNORMAL HIGH (ref 1.9–3.7)
Glucose, Bld: 111 mg/dL — ABNORMAL HIGH (ref 65–99)
Potassium: 3.9 mmol/L (ref 3.5–5.3)
Sodium: 139 mmol/L (ref 135–146)
Total Bilirubin: 0.3 mg/dL (ref 0.2–1.2)
Total Protein: 8.1 g/dL (ref 6.1–8.1)
eGFR: 85 mL/min/{1.73_m2} (ref >=60)

## 2023-08-05 LAB — LIPID PANEL
Cholesterol: 210 mg/dL — ABNORMAL HIGH (ref <200)
HDL: 50 mg/dL (ref >=50)
LDL Cholesterol (Calc): 133 mg/dL — ABNORMAL HIGH
Non-HDL Cholesterol (Calc): 160 mg/dL — ABNORMAL HIGH (ref <130)
Total CHOL/HDL Ratio: 4.2 (calc) (ref <5.0)
Triglycerides: 143 mg/dL (ref <150)

## 2023-08-05 LAB — MICROALBUMIN / CREATININE URINE RATIO
Creatinine, Urine: 147 mg/dL (ref 20–275)
Microalb Creat Ratio: 2 mg/g{creat} (ref <30)
Microalb, Ur: 0.3 mg/dL

## 2023-08-05 LAB — HEMOGLOBIN A1C
Hgb A1c MFr Bld: 7.2 %{Hb} — ABNORMAL HIGH (ref <5.7)
Mean Plasma Glucose: 160 mg/dL
eAG (mmol/L): 8.9 mmol/L

## 2023-08-05 LAB — TSH: TSH: 1.4 m[IU]/L

## 2023-08-05 LAB — INSULIN, RANDOM: Insulin: 32.7 u[IU]/mL — ABNORMAL HIGH

## 2023-08-05 LAB — VITAMIN D 25 HYDROXY (VIT D DEFICIENCY, FRACTURES): Vit D, 25-Hydroxy: 93 ng/mL (ref 30–100)

## 2023-08-13 ENCOUNTER — Other Ambulatory Visit: Payer: Self-pay | Admitting: Nurse Practitioner

## 2023-08-13 DIAGNOSIS — E1169 Type 2 diabetes mellitus with other specified complication: Secondary | ICD-10-CM

## 2023-08-22 IMAGING — CR DG LUMBAR SPINE COMPLETE 4+V
5 series · 5 of 5 positions shown · non-contrast
Comparison: None.

CLINICAL DATA: Right low back/sacroiliac joint pain x 1 month.
Patient reports pain starts in right calf and radiates up into right
mid thigh.

EXAM:
LUMBAR SPINE - COMPLETE 4+ VIEW

[w lumbar spine ap]
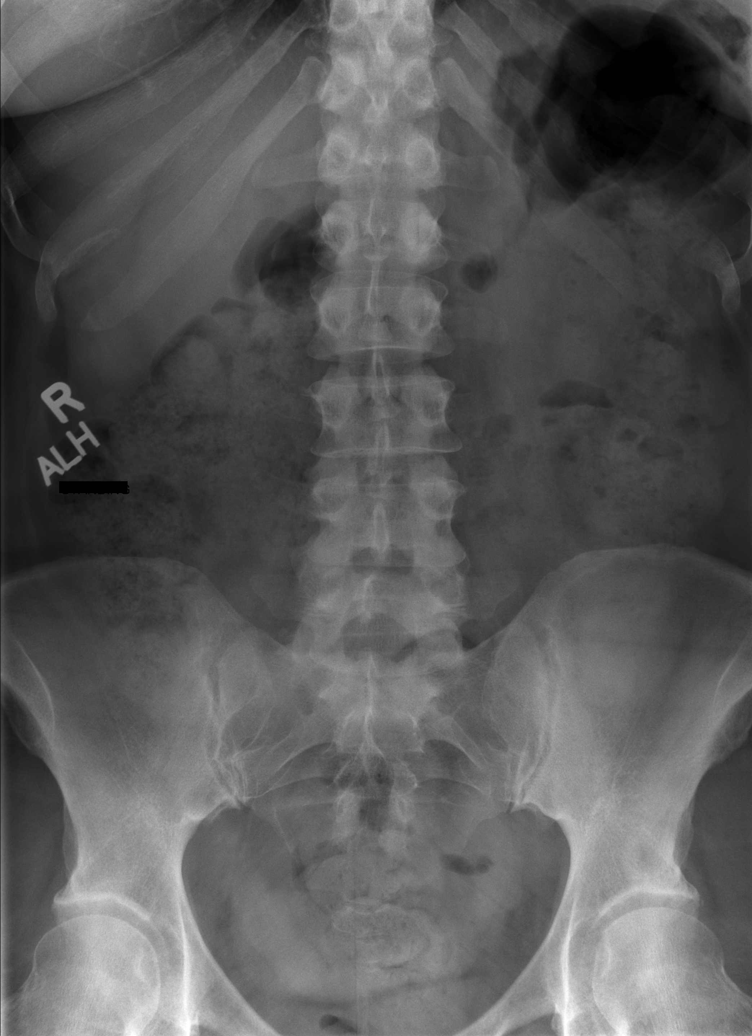

[w lumbar spine obl (1 of 2)]
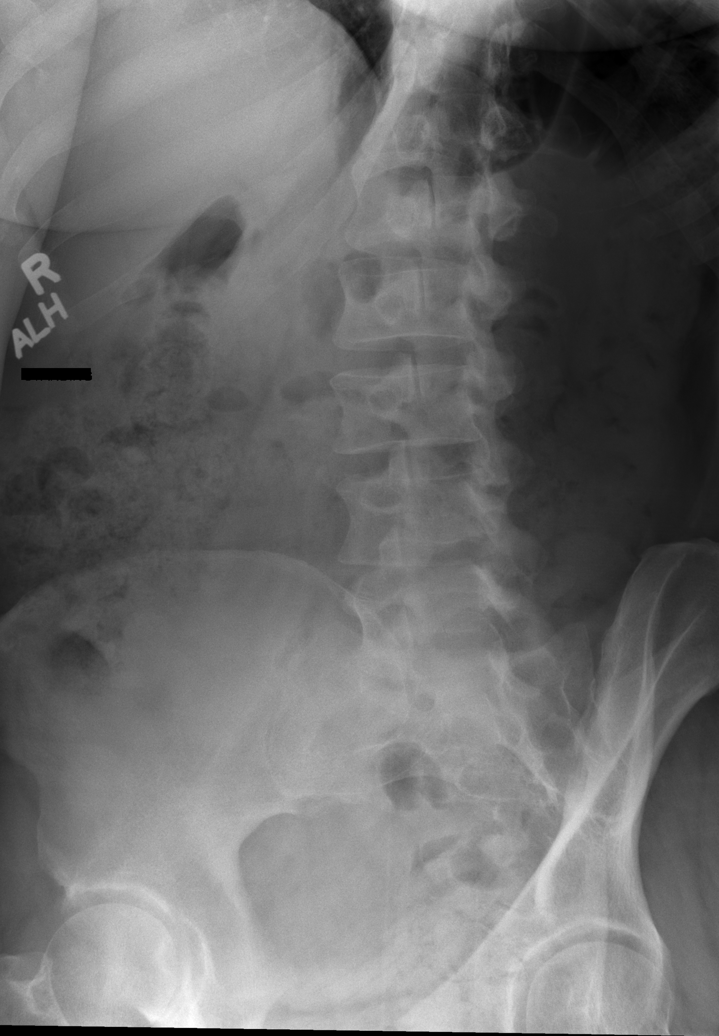

[w lumbar spine obl (2 of 2)]
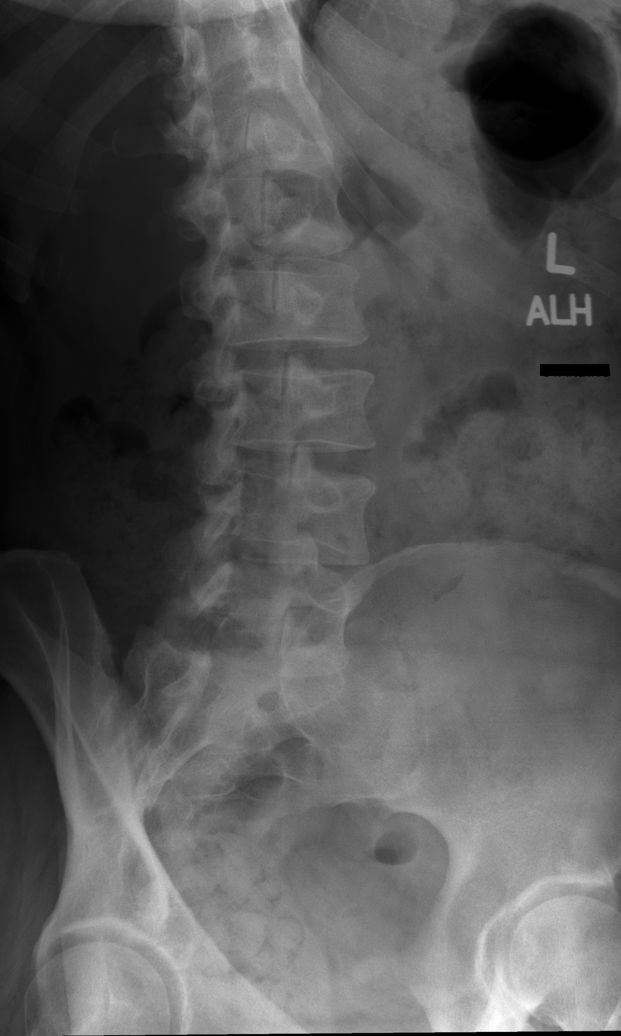

[w lumbar spine lat]
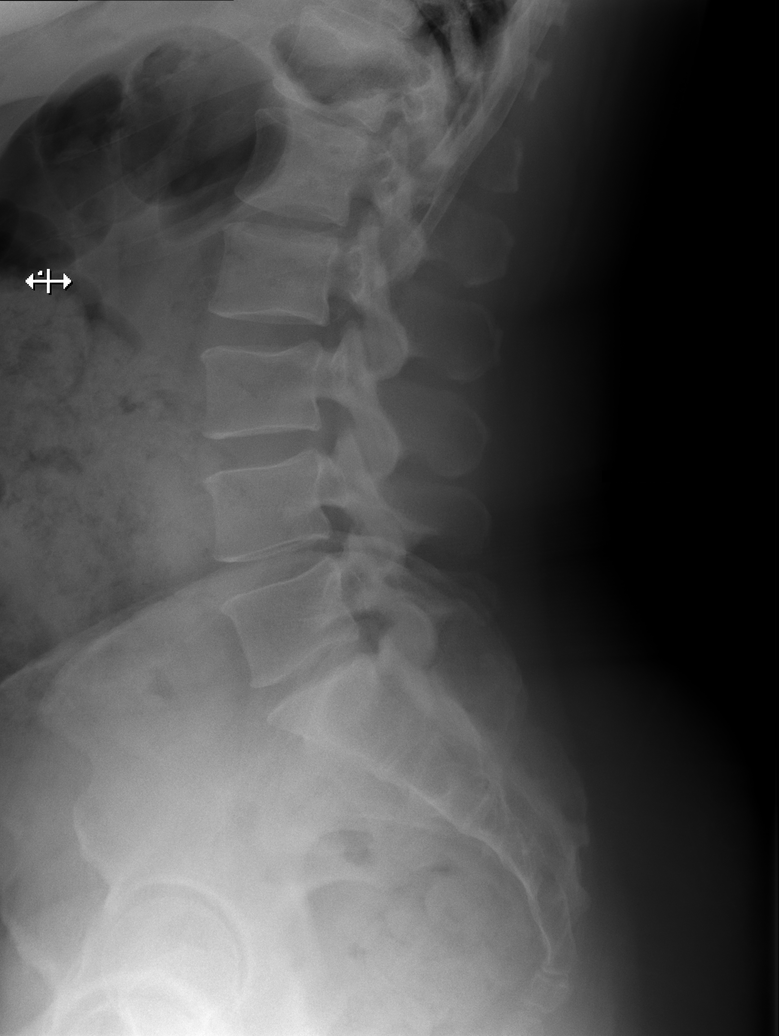

[w lumbar l-5 s-1 spot]
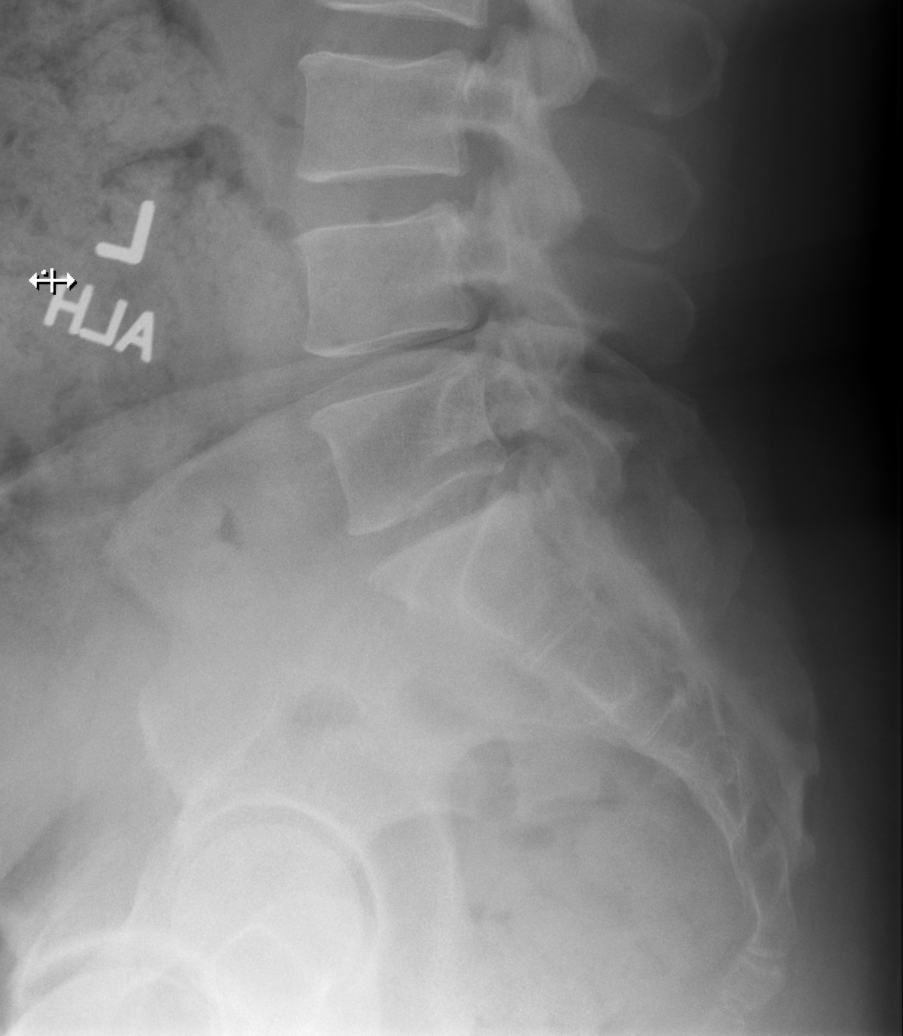

[5 of 5 positions shown; findings below may reference images not displayed]

FINDINGS: There are five non-rib bearing lumbar-type vertebral bodies with
riblets at T12. There is normal alignment. There is no evidence for
acute fracture or subluxation. Intervertebral disc spaces are
preserved without significant degenerative changes for age.
Visualized abdomen is unremarkable.
IMPRESSION: Unremarkable lumbar spine radiographs.

## 2023-09-21 ENCOUNTER — Encounter: Payer: Self-pay | Admitting: Nurse Practitioner

## 2023-09-22 MED ORDER — MOUNJARO 5 MG/0.5ML ~~LOC~~ SOAJ: 5.0000 mg | SUBCUTANEOUS | 0 refills | Status: DC

## 2023-11-03 ENCOUNTER — Encounter: Payer: Self-pay | Admitting: Nurse Practitioner

## 2023-11-03 MED ORDER — TIRZEPATIDE 7.5 MG/0.5ML ~~LOC~~ SOAJ: SUBCUTANEOUS | 0 refills | Status: DC

## 2023-12-07 ENCOUNTER — Other Ambulatory Visit: Payer: Self-pay | Admitting: Nurse Practitioner

## 2023-12-08 ENCOUNTER — Encounter: Payer: Self-pay | Admitting: Nurse Practitioner

## 2023-12-08 MED ORDER — TIRZEPATIDE 7.5 MG/0.5ML ~~LOC~~ SOAJ: SUBCUTANEOUS | 0 refills | Status: DC

## 2024-01-11 ENCOUNTER — Other Ambulatory Visit: Payer: Self-pay

## 2024-01-11 MED ORDER — TIRZEPATIDE 7.5 MG/0.5ML ~~LOC~~ SOAJ: SUBCUTANEOUS | 3 refills | Status: AC

## 2024-02-09 ENCOUNTER — Ambulatory Visit: Payer: 59 | Admitting: Nurse Practitioner

## 2024-08-03 ENCOUNTER — Encounter: Payer: 59 | Admitting: Nurse Practitioner

## 2024-09-14 ENCOUNTER — Other Ambulatory Visit: Payer: Self-pay | Admitting: Nurse Practitioner

## 2024-09-14 DIAGNOSIS — Z8271 Family history of polycystic kidney: Secondary | ICD-10-CM

## 2024-09-16 ENCOUNTER — Ambulatory Visit
Admission: RE | Admit: 2024-09-16 | Discharge: 2024-09-16 | Disposition: A | Discharge status: Home / Self Care | Source: Ambulatory Visit | Attending: Nurse Practitioner | Admitting: Nurse Practitioner

## 2024-09-16 DIAGNOSIS — Z8271 Family history of polycystic kidney: Secondary | ICD-10-CM

## 2024-11-29 ENCOUNTER — Other Ambulatory Visit: Payer: Self-pay | Admitting: Nurse Practitioner
